# Patient Record
Sex: Female | Born: 1945
Health system: Southern US, Community
[De-identification: ages and names within clinical notes are randomized; demographics above are authoritative.]

## PROBLEM LIST (undated history)

## (undated) DIAGNOSIS — J42 Unspecified chronic bronchitis: Secondary | ICD-10-CM

## (undated) DIAGNOSIS — J189 Pneumonia, unspecified organism: Secondary | ICD-10-CM

## (undated) DIAGNOSIS — R911 Solitary pulmonary nodule: Secondary | ICD-10-CM

## (undated) DIAGNOSIS — I10 Essential (primary) hypertension: Secondary | ICD-10-CM

## (undated) DIAGNOSIS — A419 Sepsis, unspecified organism: Secondary | ICD-10-CM

## (undated) DIAGNOSIS — E785 Hyperlipidemia, unspecified: Secondary | ICD-10-CM

## (undated) HISTORY — DX: Essential (primary) hypertension: I10

## (undated) HISTORY — PX: CHOLECYSTECTOMY: SHX55

## (undated) HISTORY — DX: Unspecified chronic bronchitis: J42

## (undated) HISTORY — DX: Solitary pulmonary nodule: R91.1

## (undated) HISTORY — DX: Hyperlipidemia, unspecified: E78.5

---

## 2016-09-23 DIAGNOSIS — Z23 Encounter for immunization: Secondary | ICD-10-CM | POA: Diagnosis not present

## 2016-10-17 DIAGNOSIS — Z6828 Body mass index (BMI) 28.0-28.9, adult: Secondary | ICD-10-CM | POA: Diagnosis not present

## 2016-10-17 DIAGNOSIS — L290 Pruritus ani: Secondary | ICD-10-CM | POA: Diagnosis not present

## 2016-10-17 DIAGNOSIS — I10 Essential (primary) hypertension: Secondary | ICD-10-CM | POA: Diagnosis not present

## 2016-10-17 DIAGNOSIS — L298 Other pruritus: Secondary | ICD-10-CM | POA: Diagnosis not present

## 2016-10-17 DIAGNOSIS — E785 Hyperlipidemia, unspecified: Secondary | ICD-10-CM | POA: Diagnosis not present

## 2017-01-17 DIAGNOSIS — Z1231 Encounter for screening mammogram for malignant neoplasm of breast: Secondary | ICD-10-CM | POA: Diagnosis not present

## 2017-05-16 DIAGNOSIS — Z23 Encounter for immunization: Secondary | ICD-10-CM | POA: Diagnosis not present

## 2017-09-10 DIAGNOSIS — Z6828 Body mass index (BMI) 28.0-28.9, adult: Secondary | ICD-10-CM | POA: Diagnosis not present

## 2017-09-10 DIAGNOSIS — Z1321 Encounter for screening for nutritional disorder: Secondary | ICD-10-CM | POA: Diagnosis not present

## 2017-09-10 DIAGNOSIS — E785 Hyperlipidemia, unspecified: Secondary | ICD-10-CM | POA: Diagnosis not present

## 2017-09-20 DIAGNOSIS — E785 Hyperlipidemia, unspecified: Secondary | ICD-10-CM | POA: Diagnosis not present

## 2017-09-20 DIAGNOSIS — Z1321 Encounter for screening for nutritional disorder: Secondary | ICD-10-CM | POA: Diagnosis not present

## 2017-11-13 DIAGNOSIS — Z0000 Encounter for general adult medical examination without abnormal findings: Secondary | ICD-10-CM | POA: Diagnosis not present

## 2017-11-13 DIAGNOSIS — Z6828 Body mass index (BMI) 28.0-28.9, adult: Secondary | ICD-10-CM | POA: Diagnosis not present

## 2018-01-21 DIAGNOSIS — Z1231 Encounter for screening mammogram for malignant neoplasm of breast: Secondary | ICD-10-CM | POA: Diagnosis not present

## 2018-06-12 DIAGNOSIS — N390 Urinary tract infection, site not specified: Secondary | ICD-10-CM | POA: Diagnosis not present

## 2018-09-04 DIAGNOSIS — J849 Interstitial pulmonary disease, unspecified: Secondary | ICD-10-CM

## 2018-09-04 HISTORY — DX: Interstitial pulmonary disease, unspecified: J84.9

## 2018-11-04 DIAGNOSIS — H1852 Epithelial (juvenile) corneal dystrophy: Secondary | ICD-10-CM | POA: Diagnosis not present

## 2018-11-04 DIAGNOSIS — H25812 Combined forms of age-related cataract, left eye: Secondary | ICD-10-CM | POA: Diagnosis not present

## 2018-11-04 DIAGNOSIS — H25811 Combined forms of age-related cataract, right eye: Secondary | ICD-10-CM | POA: Diagnosis not present

## 2018-11-04 DIAGNOSIS — Z01818 Encounter for other preprocedural examination: Secondary | ICD-10-CM | POA: Diagnosis not present

## 2018-11-04 DIAGNOSIS — H02052 Trichiasis without entropian right lower eyelid: Secondary | ICD-10-CM | POA: Diagnosis not present

## 2018-11-26 DIAGNOSIS — Z808 Family history of malignant neoplasm of other organs or systems: Secondary | ICD-10-CM | POA: Diagnosis not present

## 2018-11-26 DIAGNOSIS — M8580 Other specified disorders of bone density and structure, unspecified site: Secondary | ICD-10-CM | POA: Diagnosis not present

## 2018-11-26 DIAGNOSIS — Z0000 Encounter for general adult medical examination without abnormal findings: Secondary | ICD-10-CM | POA: Diagnosis not present

## 2018-11-26 DIAGNOSIS — Z1211 Encounter for screening for malignant neoplasm of colon: Secondary | ICD-10-CM | POA: Diagnosis not present

## 2018-11-26 DIAGNOSIS — E2839 Other primary ovarian failure: Secondary | ICD-10-CM | POA: Diagnosis not present

## 2018-11-26 DIAGNOSIS — E7800 Pure hypercholesterolemia, unspecified: Secondary | ICD-10-CM | POA: Diagnosis not present

## 2018-11-26 DIAGNOSIS — I10 Essential (primary) hypertension: Secondary | ICD-10-CM | POA: Diagnosis not present

## 2018-11-26 DIAGNOSIS — R350 Frequency of micturition: Secondary | ICD-10-CM | POA: Diagnosis not present

## 2018-11-26 DIAGNOSIS — Z1159 Encounter for screening for other viral diseases: Secondary | ICD-10-CM | POA: Diagnosis not present

## 2018-11-26 DIAGNOSIS — Z1389 Encounter for screening for other disorder: Secondary | ICD-10-CM | POA: Diagnosis not present

## 2018-11-26 DIAGNOSIS — N952 Postmenopausal atrophic vaginitis: Secondary | ICD-10-CM | POA: Diagnosis not present

## 2018-12-12 ENCOUNTER — Other Ambulatory Visit: Payer: Self-pay | Admitting: Family Medicine

## 2018-12-12 DIAGNOSIS — E2839 Other primary ovarian failure: Secondary | ICD-10-CM

## 2019-01-21 DIAGNOSIS — H25811 Combined forms of age-related cataract, right eye: Secondary | ICD-10-CM | POA: Diagnosis not present

## 2019-01-21 DIAGNOSIS — Z01818 Encounter for other preprocedural examination: Secondary | ICD-10-CM | POA: Diagnosis not present

## 2019-01-22 DIAGNOSIS — Z1231 Encounter for screening mammogram for malignant neoplasm of breast: Secondary | ICD-10-CM | POA: Diagnosis not present

## 2019-02-10 DIAGNOSIS — T7840XA Allergy, unspecified, initial encounter: Secondary | ICD-10-CM | POA: Diagnosis not present

## 2019-02-16 ENCOUNTER — Encounter (HOSPITAL_COMMUNITY): Payer: Self-pay

## 2019-02-16 ENCOUNTER — Emergency Department (HOSPITAL_COMMUNITY): Payer: Medicare Other

## 2019-02-16 ENCOUNTER — Emergency Department (HOSPITAL_COMMUNITY)
Admission: EM | Admit: 2019-02-16 | Discharge: 2019-02-16 | Disposition: A | Discharge status: Home / Self Care | Payer: Medicare Other | Attending: Emergency Medicine | Admitting: Emergency Medicine

## 2019-02-16 ENCOUNTER — Other Ambulatory Visit: Payer: Self-pay

## 2019-02-16 DIAGNOSIS — J209 Acute bronchitis, unspecified: Secondary | ICD-10-CM | POA: Insufficient documentation

## 2019-02-16 DIAGNOSIS — Z79899 Other long term (current) drug therapy: Secondary | ICD-10-CM | POA: Diagnosis not present

## 2019-02-16 DIAGNOSIS — J4540 Moderate persistent asthma, uncomplicated: Secondary | ICD-10-CM

## 2019-02-16 DIAGNOSIS — Z87891 Personal history of nicotine dependence: Secondary | ICD-10-CM | POA: Insufficient documentation

## 2019-02-16 DIAGNOSIS — R05 Cough: Secondary | ICD-10-CM | POA: Diagnosis not present

## 2019-02-16 DIAGNOSIS — R918 Other nonspecific abnormal finding of lung field: Secondary | ICD-10-CM | POA: Diagnosis not present

## 2019-02-16 HISTORY — DX: Pneumonia, unspecified organism: J18.9

## 2019-02-16 HISTORY — DX: Sepsis, unspecified organism: A41.9

## 2019-02-16 LAB — CBC
HCT: 43 % (ref 36.0–46.0)
Hemoglobin: 14.4 g/dL (ref 12.0–15.0)
MCH: 30.8 pg (ref 26.0–34.0)
MCHC: 33.5 g/dL (ref 30.0–36.0)
MCV: 92.1 fL (ref 80.0–100.0)
Platelets: 228 10*3/uL (ref 150–400)
RBC: 4.67 MIL/uL (ref 3.87–5.11)
RDW: 13.2 % (ref 11.5–15.5)
WBC: 11.9 10*3/uL — ABNORMAL HIGH (ref 4.0–10.5)
nRBC: 0 % (ref 0.0–0.2)

## 2019-02-16 LAB — BASIC METABOLIC PANEL
Anion gap: 10 (ref 5–15)
BUN: 15 mg/dL (ref 8–23)
CO2: 29 mmol/L (ref 22–32)
Calcium: 9.1 mg/dL (ref 8.9–10.3)
Chloride: 100 mmol/L (ref 98–111)
Creatinine, Ser: 0.59 mg/dL (ref 0.44–1.00)
GFR calc Af Amer: >60 mL/min (ref >60)
GFR calc non Af Amer: >60 mL/min (ref >60)
Glucose, Bld: 103 mg/dL — ABNORMAL HIGH (ref 70–99)
Potassium: 3.6 mmol/L (ref 3.5–5.1)
Sodium: 139 mmol/L (ref 135–145)

## 2019-02-16 IMAGING — DX PORTABLE CHEST - 1 VIEW
2 series · 2 of 2 positions shown · non-contrast
Comparison: None.

CLINICAL DATA: Cough and weakness.

EXAM:
PORTABLE CHEST 1 VIEW

[chest ap (1 of 2)]
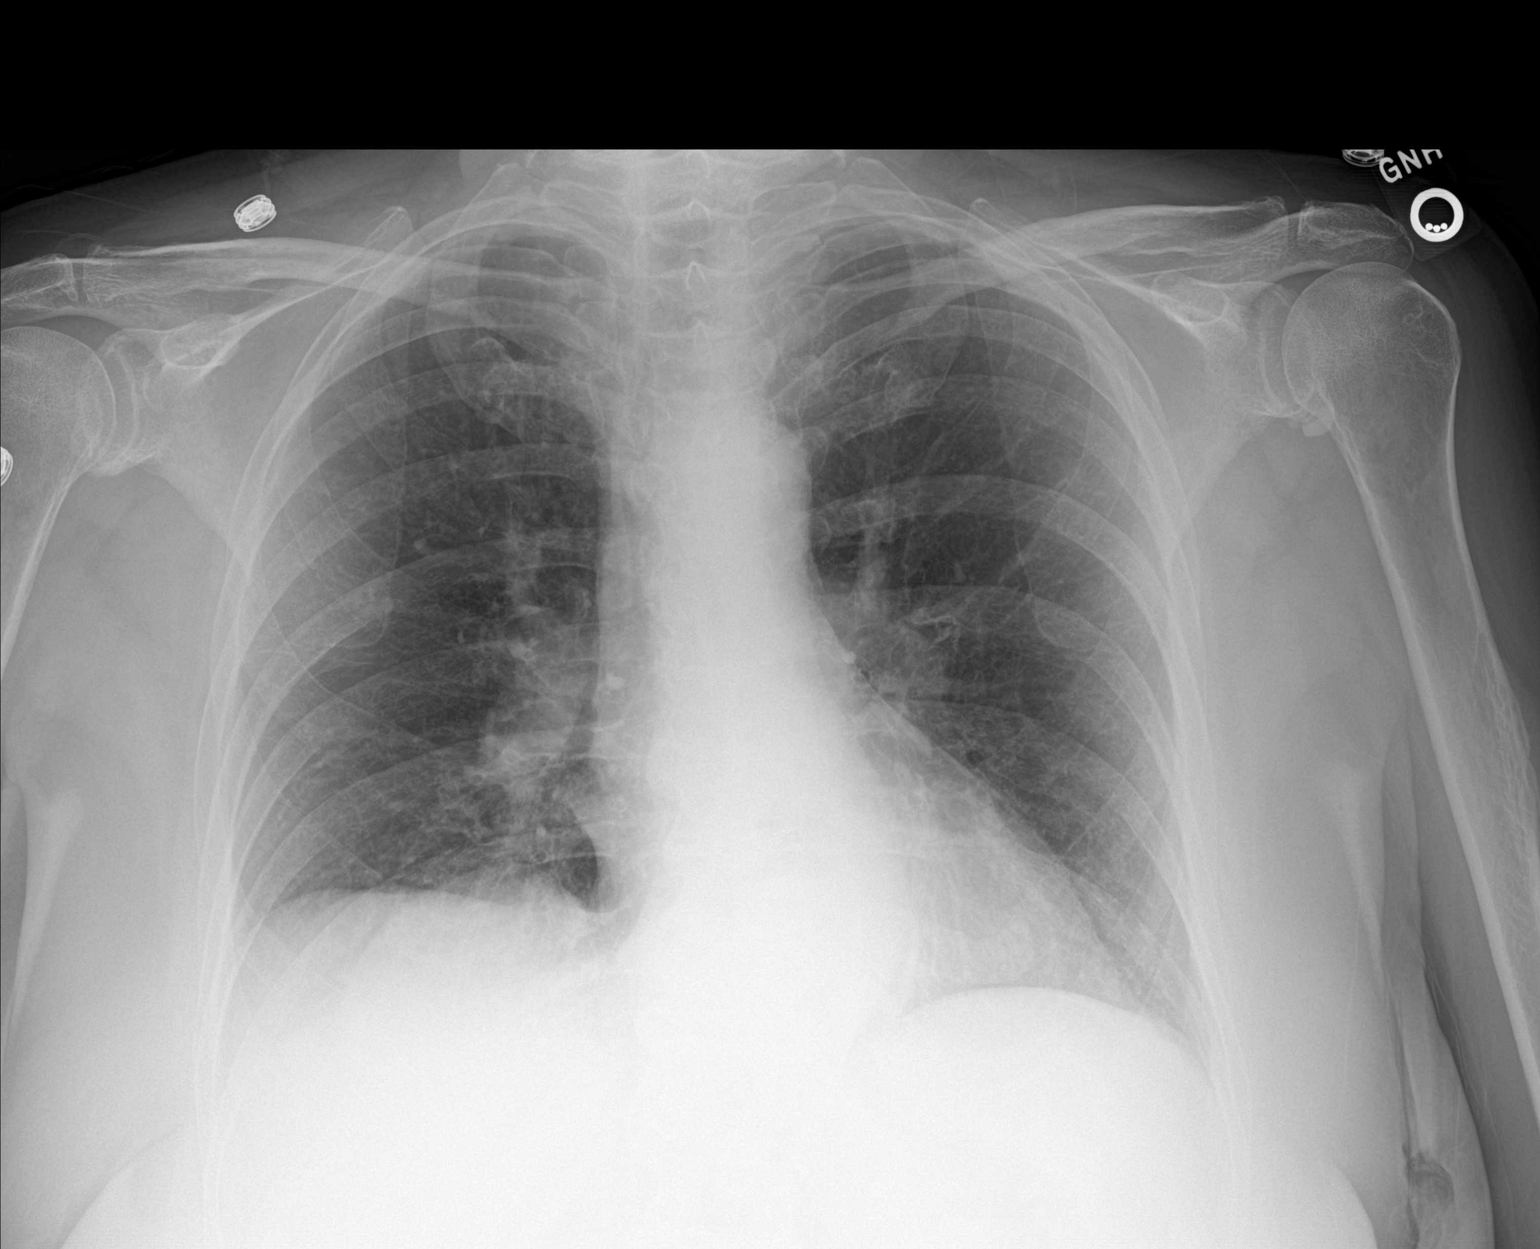

[chest ap (2 of 2)]
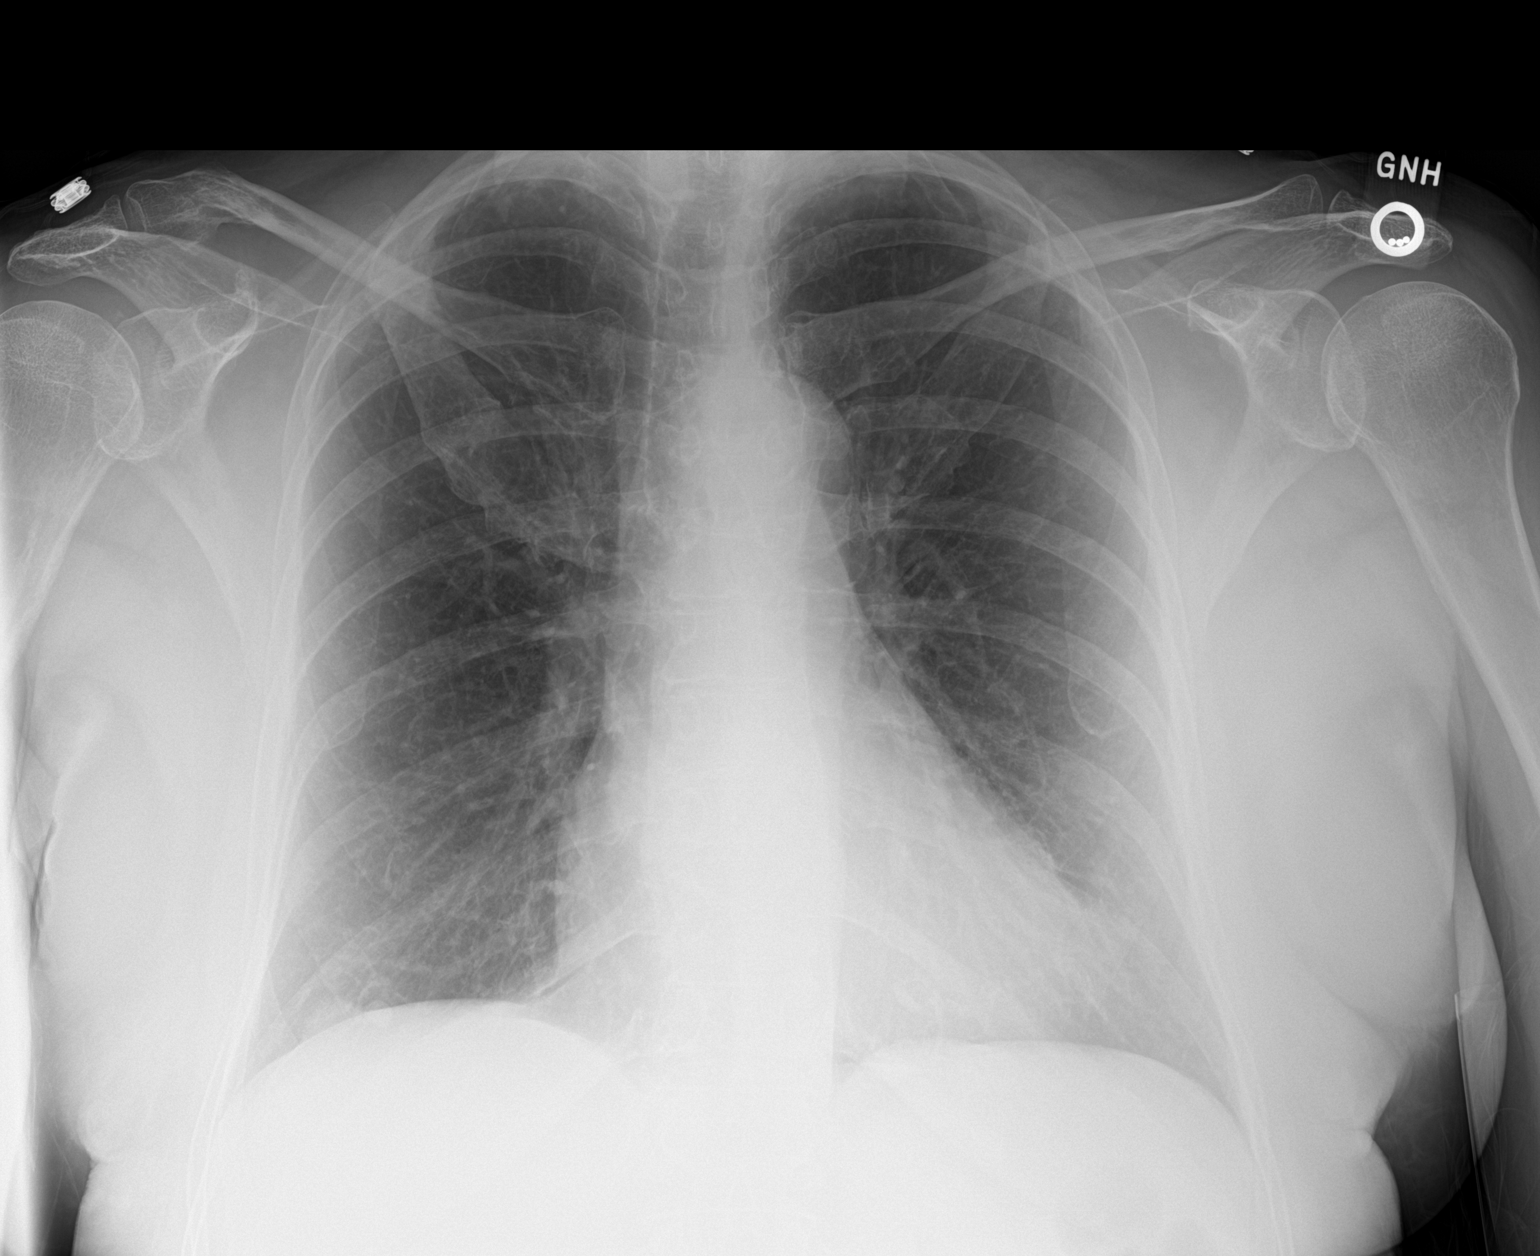

[2 of 2 positions shown; findings below may reference images not displayed]

FINDINGS: The heart, hila, and mediastinum are normal. No pneumothorax. Mild
increased opacity in the medial right base. No other pulmonary
opacities, nodules, or masses.
IMPRESSION: Mild opacity in the medial right lung base is favored to represent
vascular crowding. A subtle infiltrate projected over the infrahilar
region is not excluded on this study. A PA and lateral chest x-ray
may better evaluate. Alternately, recommend short-term follow-up to
ensure resolution.

## 2019-02-16 IMAGING — CT CT CHEST WITHOUT CONTRAST
2 of 3 series · 15 of 36 positions shown, 18 images · non-contrast
Comparison: Radiograph [DATE]

CLINICAL DATA: Cough and short of breath for 1 week

EXAM:
CT CHEST WITHOUT CONTRAST
TECHNIQUE: Multidetector CT imaging of the chest was performed following the
standard protocol without IV contrast.

[Series 2: thorax · axial · 0.68mm/px · z∈[-268,-12]mm · 12 of 152 slices shown, 15 images]
[im 12/152  mediastinal]
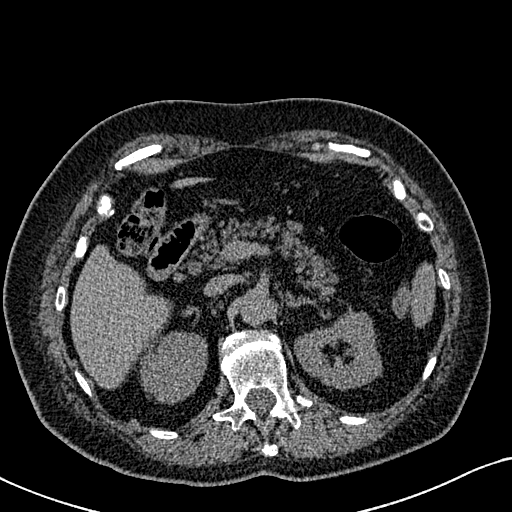
[im 12/152  lung]
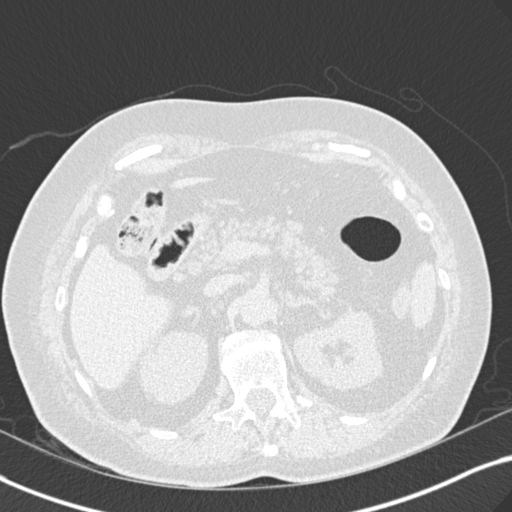
[im 23/152  lung]
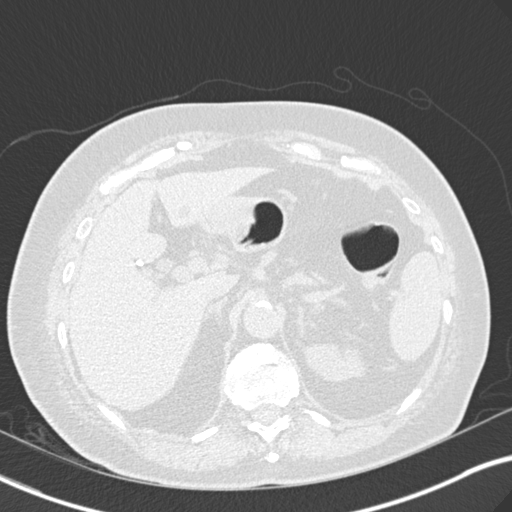
[im 34/152  lung]
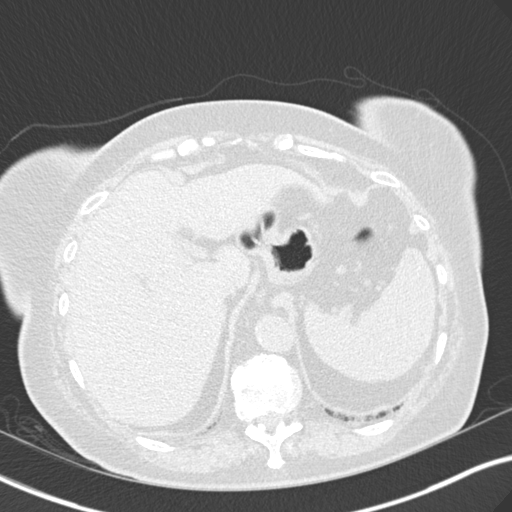
[im 45/152  lung]
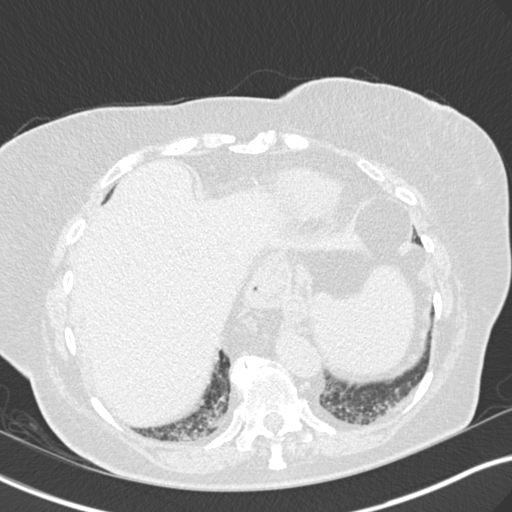
[im 56/152  mediastinal]
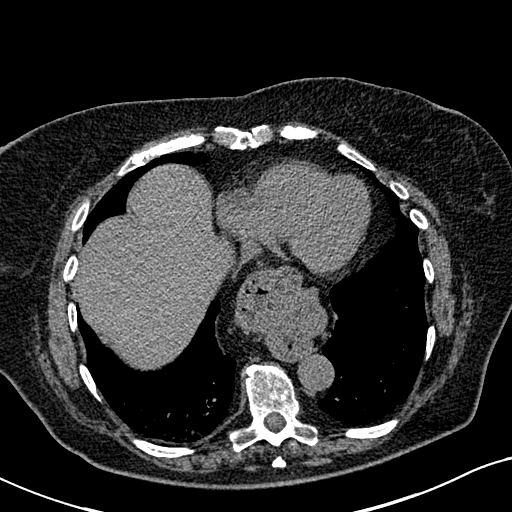
[im 56/152  lung]
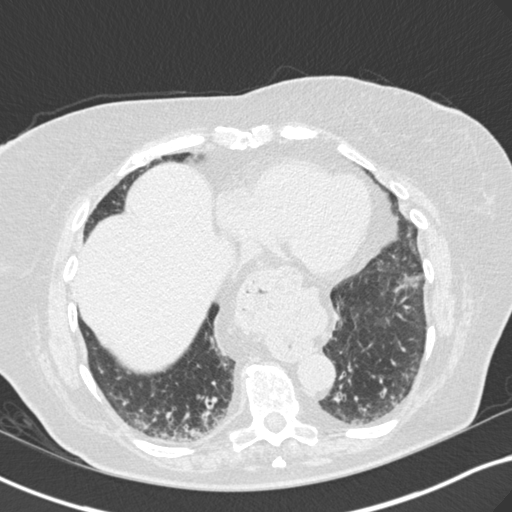
[im 68/152  lung]
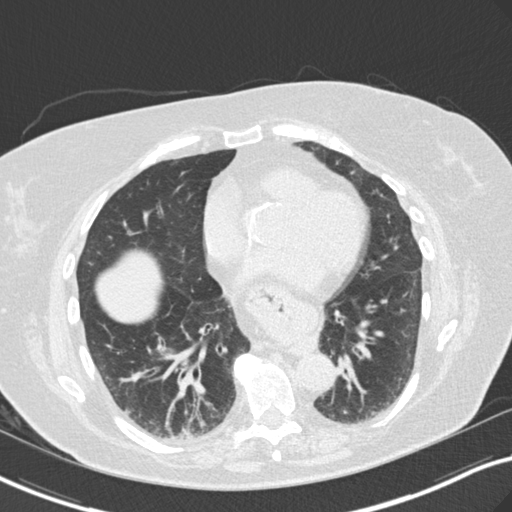
[im 84/152  lung]
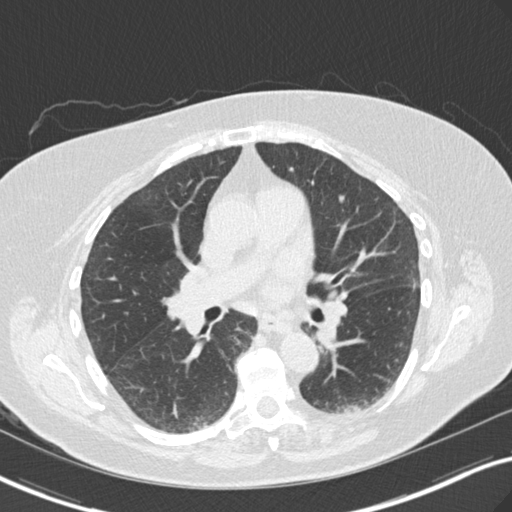
[im 96/152  lung]
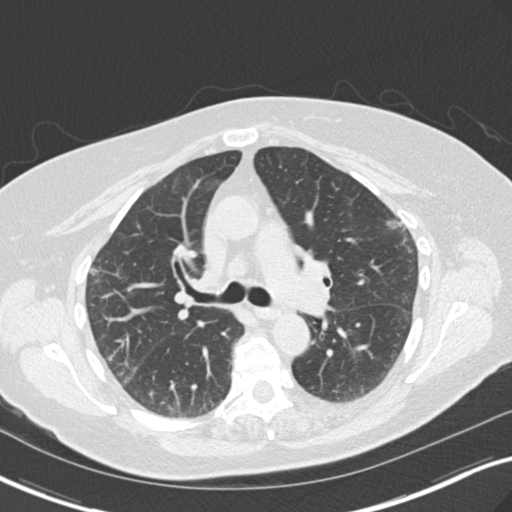
[im 107/152  mediastinal]
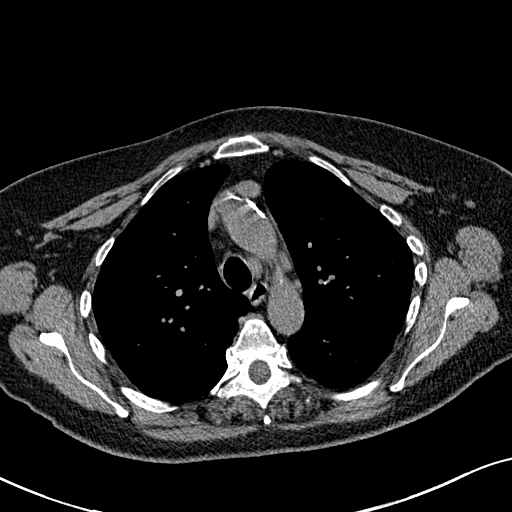
[im 107/152  lung]
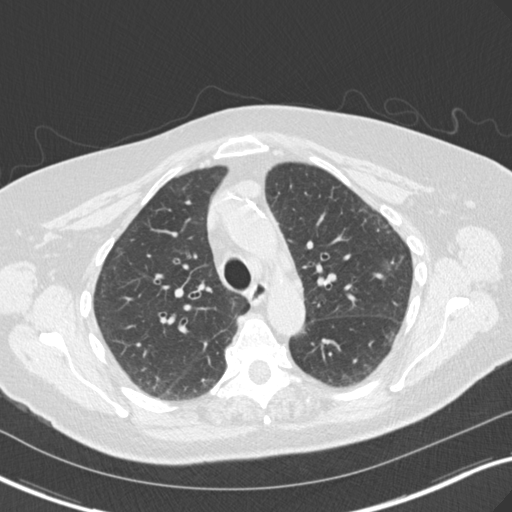
[im 118/152  lung]
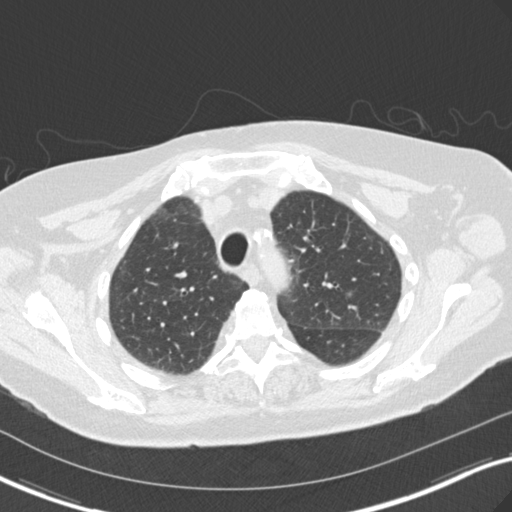
[im 129/152  lung]
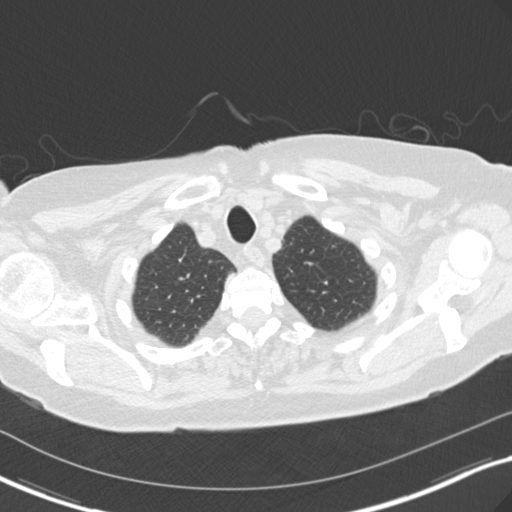
[im 140/152  lung]
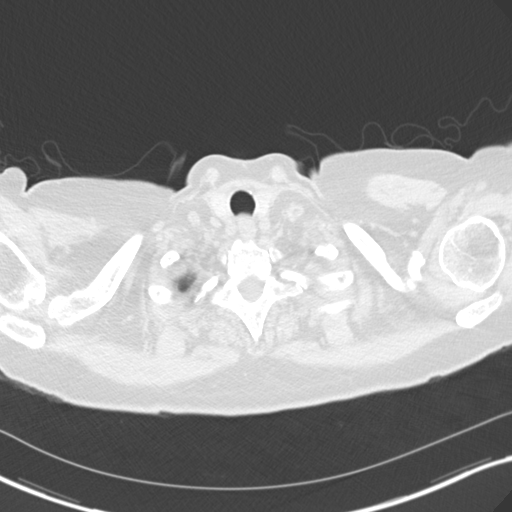

[Series 5: coronal · coronal · 0.64mm/px · 3 of 132 slices shown]
[im 27/132  lung]
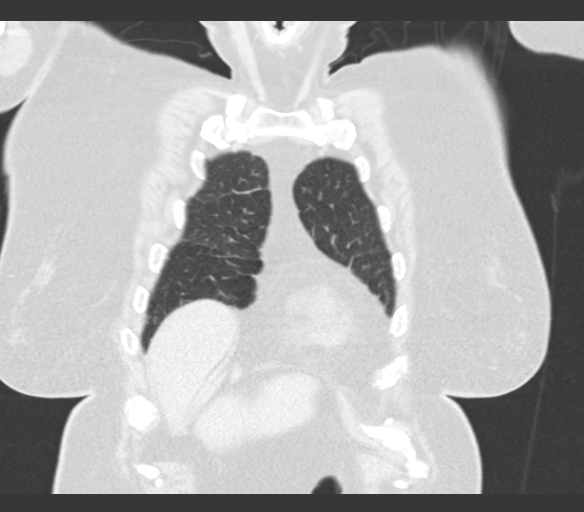
[im 53/132  lung]
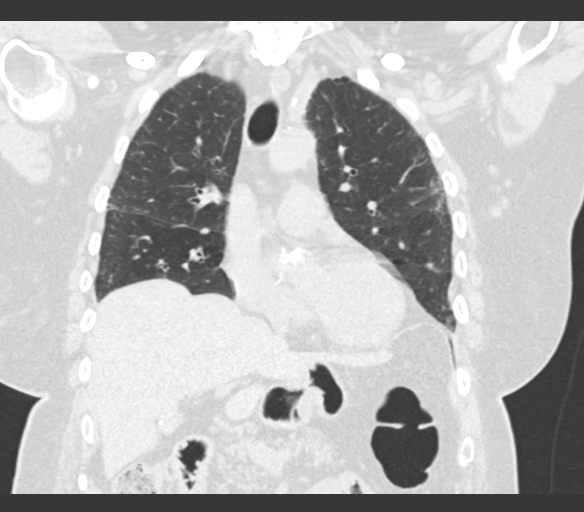
[im 79/132  lung]
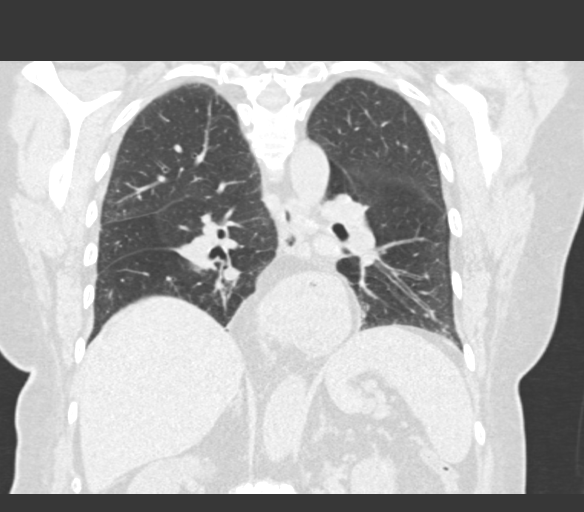

[15 of 36 positions shown; findings below may reference images not displayed]

FINDINGS: Cardiovascular: No significant vascular findings. Normal heart size.
No pericardial effusion.

Mediastinum/Nodes: No axillary supraclavicular adenopathy.
Paratracheal lymph nodes and RIGHT hilar nodes are mildly enlarged
measuring 12-15 mm. Large hiatal hernia posterior the heart.

Lungs/Pleura: Subpleural reticulation in lungs. Bronchial thickening
in the lower lobes. Scattered ground-glass opacities.

Solid nodule with hazy margin in the LEFT upper lobe measures 6 mm
(image 45/7).

Upper Abdomen: Limited view of the liver, kidneys, pancreas are
unremarkable. Normal adrenal glands.

Musculoskeletal: No aggressive osseous lesion.
IMPRESSION: 1. Bronchial thickening in lower lobes and scattered ground-glass
opacities as well as subpleural reticulation. Findings suggest mild
interstitial lung disease potential respiratory bronchiolitis
interstitial lung disease.
2. Mild mediastinal adenopathy likely reactive. Large hiatal hernia.
3. Noncalcified nodule in the LEFT upper lobe warrants follow-up.
Non-contrast chest CT at 6-12 months is recommended. If the nodule
is stable at time of repeat CT, then future CT at 18-24 months (from
today's scan) is considered optional for low-risk patients, but is
recommended for high-risk patients. This recommendation follows the
consensus statement: Guidelines for Management of Incidental
Pulmonary Nodules Detected on CT Images: From the [HOSPITAL]

## 2019-02-16 MED ORDER — ALBUTEROL SULFATE HFA 108 (90 BASE) MCG/ACT IN AERS: 1.0000 | INHALATION_SPRAY | Freq: Four times a day (QID) | RESPIRATORY_TRACT | 1 refills | Status: DC | PRN

## 2019-02-16 MED ORDER — PREDNISONE 10 MG PO TABS: 40.0000 mg | ORAL_TABLET | Freq: Every day | ORAL | 0 refills | Status: DC

## 2019-02-16 NOTE — ED Provider Notes (Signed)
[facility] DEPT Provider Note   CSN: [id number] Arrival date & time: 02/16/19  1026     History   Chief Complaint Chief Complaint  Patient presents with   Cough    HPI Ann [name] is a 73 y.o. female.     Patient with a complaint of a productive cough for 2 to 3 weeks.  She is having a lot of difficulty sleeping because of the cough.  Followed by Ann [name] primary care.  They did a telemedicine visit with her and started her on Tessalon Perles and Zyrtec.  Patient states it has not really helped much.  She denies any fevers.  Denies any COVID-19 exposure.  Patient is also been feeling a little fatigued and a little lethargic.  No syncope no headache no chest pain no abdominal pain.  No leg swelling.     Past Medical History:  Diagnosis Date   Pneumonia    Sepsis ([place])     There are no active problems to display for this patient.   Past Surgical History:  Procedure Laterality Date   CHOLECYSTECTOMY       OB History   No obstetric history on file.      Home Medications    Prior to Admission medications   Medication Sig Start Date End Date Taking? Authorizing Provider  atorvastatin (LIPITOR) 20 MG tablet Take 20 mg by mouth daily. 12/26/18  Yes [provider]  benzonatate (TESSALON) 100 MG capsule Take 100 mg by mouth daily. 02/13/19  Yes [provider]  cetirizine (ZYRTEC) 10 MG tablet Take 10 mg by mouth daily.   Yes [provider]  estradiol (ESTRACE) 0.1 MG/GM vaginal cream Place 0.5 g vaginally 2 (two) times a week. 11/28/18  Yes [provider]  fluticasone (FLONASE) 50 MCG/ACT nasal spray Place 1 spray into both nostrils daily.   Yes [provider]  lisinopril-hydrochlorothiazide (ZESTORETIC) 20-25 MG tablet Take 1 tablet by mouth daily. 12/26/18  Yes [provider]  albuterol (VENTOLIN HFA) 108 (90 Base) MCG/ACT inhaler Inhale 1-2 puffs into the lungs every 6 (six)  hours as needed for wheezing or shortness of breath. 02/16/19   Fredia Sorrow, MD  predniSONE (DELTASONE) 10 MG tablet Take 4 tablets (40 mg total) by mouth daily. 02/16/19   Fredia Sorrow, MD    Family History Family History  Problem Relation Age of Onset   Heart failure Mother    Osteoporosis Mother    COPD Father    Diabetes Father     Social History Social History   Tobacco Use   Smoking status: Former Smoker   Smokeless tobacco: Never Used   Tobacco comment: 40 years ago  Substance Use Topics   Alcohol use: Yes   Drug use: Never     Allergies   Patient has no known allergies.   Review of Systems Review of Systems  Constitutional: Positive for fatigue. Negative for chills and fever.  HENT: Positive for congestion. Negative for rhinorrhea and sore throat.   Eyes: Negative for visual disturbance.  Respiratory: Positive for cough. Negative for shortness of breath.   Cardiovascular: Negative for chest pain and leg swelling.  Gastrointestinal: Negative for abdominal pain, diarrhea, nausea and vomiting.  Genitourinary: Negative for dysuria.  Musculoskeletal: Negative for back pain and neck pain.  Skin: Negative for rash.  Neurological: Negative for dizziness, light-headedness and headaches.  Hematological: Does not bruise/bleed easily.  Psychiatric/Behavioral: Negative for confusion.     Physical Exam Updated  Vital Signs BP 131/62    Pulse 93    Temp 98.5 F (36.9 C) (Oral)    Resp 18    Ht 1.626 m (5\' 4" )    Wt 74.8 kg    SpO2 95%    BMI 28.32 kg/m   Physical Exam Vitals signs and nursing note reviewed.  Constitutional:      General: She is not in acute distress.    Appearance: She is well-developed.  HENT:     Head: Normocephalic and atraumatic.  Eyes:     Extraocular Movements: Extraocular movements intact.     Conjunctiva/sclera: Conjunctivae normal.     Pupils: Pupils are equal, round, and reactive to light.  Neck:     Musculoskeletal:  Normal range of motion and neck supple.  Cardiovascular:     Rate and Rhythm: Normal rate and regular rhythm.     Heart sounds: No murmur.  Pulmonary:     Effort: Pulmonary effort is normal. No respiratory distress.     Breath sounds: Wheezing present.     Comments: Very mild wheezing bilaterally. Chest:     Chest wall: No tenderness.  Abdominal:     Palpations: Abdomen is soft.     Tenderness: There is no abdominal tenderness.  Musculoskeletal: Normal range of motion.        General: No swelling.  Skin:    General: Skin is warm and dry.     Capillary Refill: Capillary refill takes less than 2 seconds.  Neurological:     General: No focal deficit present.     Mental Status: She is alert and oriented to person, place, and time.      ED Treatments / Results  Labs (all labs ordered are listed, but only abnormal results are displayed) Labs Reviewed  CBC - Abnormal; Notable for the following components:      Result Value   WBC 11.9 (*)    All other components within normal limits  BASIC METABOLIC PANEL - Abnormal; Notable for the following components:   Glucose, Bld 103 (*)    All other components within normal limits    EKG    Radiology Ct Chest Wo Contrast  Result Date: 02/16/2019 CLINICAL DATA:  Cough and short of breath for 1 week EXAM: CT CHEST WITHOUT CONTRAST TECHNIQUE: Multidetector CT imaging of the chest was performed following the standard protocol without IV contrast. COMPARISON:  Radiograph 02/16/2019 FINDINGS: Cardiovascular: No significant vascular findings. Normal heart size. No pericardial effusion. Mediastinum/Nodes: No axillary supraclavicular adenopathy. Paratracheal lymph nodes and RIGHT hilar nodes are mildly enlarged measuring 12-15 mm. Large hiatal hernia posterior the heart. Lungs/Pleura: Subpleural reticulation in lungs. Bronchial thickening in the lower lobes. Scattered ground-glass opacities. Solid nodule with hazy margin in the LEFT upper lobe  measures 6 mm (image 45/7). Upper Abdomen: Limited view of the liver, kidneys, pancreas are unremarkable. Normal adrenal glands. Musculoskeletal: No aggressive osseous lesion. IMPRESSION: 1. Bronchial thickening in lower lobes and scattered ground-glass opacities as well as subpleural reticulation. Findings suggest mild interstitial lung disease potential respiratory bronchiolitis interstitial lung disease. 2. Mild mediastinal adenopathy likely reactive. Large hiatal hernia. 3. Noncalcified nodule in the LEFT upper lobe warrants follow-up. Non-contrast chest CT at 6-12 months is recommended. If the nodule is stable at time of repeat CT, then future CT at 18-24 months (from today's scan) is considered optional for low-risk patients, but is recommended for high-risk patients. This recommendation follows the consensus statement: Guidelines for Management of Incidental Pulmonary Nodules  Detected on CT Images: From the Fleischner Society 2017; Radiology 2017; [phone]. Electronically Signed   By: Suzy Bouchard M.D.   On: 02/16/2019 14:55   Dg Chest Port 1 View  Result Date: 02/16/2019 CLINICAL DATA:  Cough and weakness. EXAM: PORTABLE CHEST 1 VIEW COMPARISON:  None. FINDINGS: The heart, hila, and mediastinum are normal. No pneumothorax. Mild increased opacity in the medial right base. No other pulmonary opacities, nodules, or masses. IMPRESSION: Mild opacity in the medial right lung base is favored to represent vascular crowding. A subtle infiltrate projected over the infrahilar region is not excluded on this study. A PA and lateral chest x-ray may better evaluate. Alternately, recommend short-term follow-up to ensure resolution. Electronically Signed   By: Dorise Bullion III M.D   On: 02/16/2019 12:31    Procedures Procedures (including critical care time)  Medications Ordered in ED Medications - No data to display   Initial Impression / Assessment and Plan / ED Course  I have reviewed the triage  vital signs and the nursing notes.  Pertinent labs & imaging results that were available during my care of the patient were reviewed by me and considered in my medical decision making (see chart for details).        Patient with some faint wheezing here.  May be having reactive airway disease.  Also has a productive cough suggestive of bronchitis.  Patient has been being treated with long-acting antihistamines without specific improvement.  We will go ahead and start her on albuterol inhaler and a 5-day course of prednisone.  Patient know she could continue to take her Zyrtec and she could continue take her Lewayne Bunting.  But recommended that she does try these 2 regimens to see how they work.  CT scan of the chest did find a pulmonary nodule no evidence of pneumonia.  Patient will need a follow-up CT scan of this in 6 to 12 months which she was informed of and understands.  Patient will make an appointment to follow back up with her regular doctor.  Did offer COVID-19 testing although clinically not suspicious patient did not want to be tested.  As stated above patient had CT chest done due to the regular chest x-ray raising concerns for right middle lobe pneumonia.  However CT chest did not show any evidence of pneumonia.  Final Clinical Impressions(s) / ED Diagnoses   Final diagnoses:  Acute bronchitis, unspecified organism  Moderate persistent reactive airway disease without complication    ED Discharge Orders         Ordered    albuterol (VENTOLIN HFA) 108 (90 Base) MCG/ACT inhaler  Every 6 hours PRN     02/16/19 1517    predniSONE (DELTASONE) 10 MG tablet  Daily     02/16/19 1517           Fredia Sorrow, MD 02/16/19 [phone]

## 2019-02-16 NOTE — ED Triage Notes (Signed)
Patient c/o a non productive cough x 2-3 weeks. Patient states she can not sleep for continuous hacking.. patient states she did have a sore throat, but it is gone at this time. Patient also c/o feeling lethargic x 3-4 days.

## 2019-02-16 NOTE — Discharge Instructions (Signed)
Take the prednisone as directed.  Use the albuterol inhaler 2 puffs every 6 hours for the least the next 7 days.  Make an appointment to follow back up with your doctor.  As we discussed there was a pulmonary nodule on the CT scan of your chest they recommend follow-up for that with a repeat CT scan in 6 to 12 months.  Return for any new or worse symptoms.

## 2019-02-19 DIAGNOSIS — R9389 Abnormal findings on diagnostic imaging of other specified body structures: Secondary | ICD-10-CM | POA: Diagnosis not present

## 2019-02-19 DIAGNOSIS — R911 Solitary pulmonary nodule: Secondary | ICD-10-CM | POA: Diagnosis not present

## 2019-02-19 DIAGNOSIS — J209 Acute bronchitis, unspecified: Secondary | ICD-10-CM | POA: Diagnosis not present

## 2019-03-08 ENCOUNTER — Other Ambulatory Visit: Payer: Self-pay

## 2019-03-08 ENCOUNTER — Emergency Department (HOSPITAL_COMMUNITY)
Admission: EM | Admit: 2019-03-08 | Discharge: 2019-03-08 | Disposition: A | Discharge status: Home / Self Care | Payer: Medicare Other | Attending: Emergency Medicine | Admitting: Emergency Medicine

## 2019-03-08 DIAGNOSIS — J40 Bronchitis, not specified as acute or chronic: Secondary | ICD-10-CM | POA: Insufficient documentation

## 2019-03-08 DIAGNOSIS — J209 Acute bronchitis, unspecified: Secondary | ICD-10-CM | POA: Diagnosis not present

## 2019-03-08 DIAGNOSIS — Z87891 Personal history of nicotine dependence: Secondary | ICD-10-CM | POA: Insufficient documentation

## 2019-03-08 DIAGNOSIS — R05 Cough: Secondary | ICD-10-CM | POA: Diagnosis present

## 2019-03-08 MED ORDER — AZITHROMYCIN 250 MG PO TABS: ORAL_TABLET | ORAL | 0 refills | Status: DC

## 2019-03-08 NOTE — Discharge Instructions (Addendum)
Zithromax as prescribed.  Continue over-the-counter cough medications as needed.  Follow-up with your primary doctor if not improving in the next week, and return to the ER if symptoms significantly worsen or change in the meantime.

## 2019-03-08 NOTE — ED Provider Notes (Signed)
[facility] DEPT Provider Note   CSN: [id number] Arrival date & time: 03/08/19  0300     History   Chief Complaint Chief Complaint  Patient presents with  . Cough    HPI Ann [name] is a 73 y.o. female.     Patient is a 73 year old female presenting with complaints of cough and congestion that has been ongoing since the end of May.  She has been seen both in the ER and by her primary doctor.  She has been given inhalers, steroids, cough medication, however none of this has helped.  She denies any fevers or chills.  She denies any COVID exposure.  The history is provided by the patient.  Cough Cough characteristics:  Non-productive Severity:  Moderate Onset quality:  Gradual Duration:  5 weeks Timing:  Constant Progression:  Worsening Chronicity:  New Smoker: no   Relieved by:  Nothing Worsened by:  Nothing Ineffective treatments:  Beta-agonist inhaler, decongestant and cough suppressants   Past Medical History:  Diagnosis Date  . Pneumonia   . Sepsis ([place])     There are no active problems to display for this patient.   Past Surgical History:  Procedure Laterality Date  . CHOLECYSTECTOMY       OB History   No obstetric history on file.      Home Medications    Prior to Admission medications   Medication Sig Start Date End Date Taking? Authorizing Provider  albuterol (VENTOLIN HFA) 108 (90 Base) MCG/ACT inhaler Inhale 1-2 puffs into the lungs every 6 (six) hours as needed for wheezing or shortness of breath. 02/16/19  Yes Fredia Sorrow, MD  atorvastatin (LIPITOR) 20 MG tablet Take 20 mg by mouth daily. 12/26/18  Yes [provider]  benzonatate (TESSALON) 100 MG capsule Take 100 mg by mouth daily. 02/13/19  Yes [provider]  estradiol (ESTRACE) 0.1 MG/GM vaginal cream Place 0.5 g vaginally 2 (two) times a week. 11/28/18  Yes [provider]  HYDROcodone-homatropine (HYCODAN) 5-1.5 MG/5ML syrup  Take 5 mLs by mouth every 6 (six) hours as needed for cough.   Yes [provider]  ibuprofen (ADVIL) 200 MG tablet Take 400 mg by mouth every 6 (six) hours as needed for moderate pain.   Yes [provider]  lisinopril-hydrochlorothiazide (ZESTORETIC) 20-25 MG tablet Take 1 tablet by mouth daily. 12/26/18  Yes [provider]  mometasone-formoterol (DULERA) 100-5 MCG/ACT AERO Inhale 2 puffs into the lungs 2 (two) times daily.   Yes [provider]  predniSONE (DELTASONE) 10 MG tablet Take 4 tablets (40 mg total) by mouth daily. Patient not taking: Reported on 03/08/2019 02/16/19   Fredia Sorrow, MD    Family History Family History  Problem Relation Age of Onset  . Heart failure Mother   . Osteoporosis Mother   . COPD Father   . Diabetes Father     Social History Social History   Tobacco Use  . Smoking status: Former Research scientist (life sciences)  . Smokeless tobacco: Never Used  . Tobacco comment: 40 years ago  Substance Use Topics  . Alcohol use: Yes  . Drug use: Never     Allergies   Patient has no known allergies.   Review of Systems Review of Systems  Respiratory: Positive for cough.   All other systems reviewed and are negative.    Physical Exam Updated Vital Signs BP (!) 170/80   Pulse (!) 108   Temp (!) 97.5 F (36.4 C) (Oral)  Resp 18   SpO2 98%   Physical Exam Vitals signs and nursing note reviewed.  Constitutional:      General: She is not in acute distress.    Appearance: She is well-developed. She is not diaphoretic.  HENT:     Head: Normocephalic and atraumatic.  Neck:     Musculoskeletal: Normal range of motion and neck supple.  Cardiovascular:     Rate and Rhythm: Normal rate and regular rhythm.     Heart sounds: No murmur. No friction rub. No gallop.   Pulmonary:     Effort: Pulmonary effort is normal. No respiratory distress.     Breath sounds: Normal breath sounds. No wheezing.  Abdominal:     General: Bowel sounds are  normal. There is no distension.     Palpations: Abdomen is soft.     Tenderness: There is no abdominal tenderness.  Musculoskeletal: Normal range of motion.  Skin:    General: Skin is warm and dry.  Neurological:     Mental Status: She is alert and oriented to person, place, and time.      ED Treatments / Results  Labs (all labs ordered are listed, but only abnormal results are displayed) Labs Reviewed - No data to display  EKG None  Radiology No results found.  Procedures Procedures (including critical care time)  Medications Ordered in ED Medications - No data to display   Initial Impression / Assessment and Plan / ED Course  I have reviewed the triage vital signs and the nursing notes.  Pertinent labs & imaging results that were available during my care of the patient were reviewed by me and considered in my medical decision making (see chart for details).  Patient presenting with persistent cough since the end of May.  Patient will be treated for bronchitis with Zithromax.  Patient has had work-up including chest x-ray, CT scan, and other testing, however no definite cause has been found.  I see no indication for further work-up this evening.  Her vitals are stable and there is no hypoxia.  Her symptoms are not consistent with COVID-19.  Ann [name] was evaluated in Emergency Department on 03/08/2019 for the symptoms described in the history of present illness. She was evaluated in the context of the global COVID-19 pandemic, which necessitated consideration that the patient might be at risk for infection with the SARS-CoV-2 virus that causes COVID-19. Institutional protocols and algorithms that pertain to the evaluation of patients at risk for COVID-19 are in a state of rapid change based on information released by regulatory bodies including the CDC and federal and state organizations. These policies and algorithms were followed during the patient's care in the ED.    Final Clinical Impressions(s) / ED Diagnoses   Final diagnoses:  None    ED Discharge Orders    None       Veryl Speak, MD 03/08/19 [phone]

## 2019-03-08 NOTE — ED Triage Notes (Signed)
Pt reports a dry cough since May. She has been seen here for same. States that she thought it was improving, but tonight got really bad. No fever. She also endorses and intermittent stabbing pain in her eye.

## 2019-03-21 DIAGNOSIS — L821 Other seborrheic keratosis: Secondary | ICD-10-CM | POA: Diagnosis not present

## 2019-03-21 DIAGNOSIS — D1801 Hemangioma of skin and subcutaneous tissue: Secondary | ICD-10-CM | POA: Diagnosis not present

## 2019-03-21 DIAGNOSIS — L570 Actinic keratosis: Secondary | ICD-10-CM | POA: Diagnosis not present

## 2019-03-26 ENCOUNTER — Other Ambulatory Visit: Payer: Self-pay

## 2019-03-26 ENCOUNTER — Emergency Department (HOSPITAL_COMMUNITY): Payer: Medicare Other

## 2019-03-26 ENCOUNTER — Encounter (HOSPITAL_COMMUNITY): Payer: Self-pay

## 2019-03-26 ENCOUNTER — Emergency Department (HOSPITAL_COMMUNITY)
Admission: EM | Admit: 2019-03-26 | Discharge: 2019-03-26 | Disposition: A | Discharge status: Home / Self Care | Payer: Medicare Other | Attending: Emergency Medicine | Admitting: Emergency Medicine

## 2019-03-26 DIAGNOSIS — Z20828 Contact with and (suspected) exposure to other viral communicable diseases: Secondary | ICD-10-CM | POA: Diagnosis not present

## 2019-03-26 DIAGNOSIS — Z79899 Other long term (current) drug therapy: Secondary | ICD-10-CM | POA: Diagnosis not present

## 2019-03-26 DIAGNOSIS — J40 Bronchitis, not specified as acute or chronic: Secondary | ICD-10-CM | POA: Diagnosis not present

## 2019-03-26 DIAGNOSIS — R7989 Other specified abnormal findings of blood chemistry: Secondary | ICD-10-CM | POA: Diagnosis not present

## 2019-03-26 DIAGNOSIS — R05 Cough: Secondary | ICD-10-CM | POA: Diagnosis not present

## 2019-03-26 DIAGNOSIS — Z87891 Personal history of nicotine dependence: Secondary | ICD-10-CM | POA: Diagnosis not present

## 2019-03-26 LAB — CBC WITH DIFFERENTIAL/PLATELET
Abs Immature Granulocytes: 0.03 10*3/uL (ref 0.00–0.07)
Basophils Absolute: 0.1 10*3/uL (ref 0.0–0.1)
Basophils Relative: 1 %
Eosinophils Absolute: 1.5 10*3/uL — ABNORMAL HIGH (ref 0.0–0.5)
Eosinophils Relative: 15 %
HCT: 41.5 % (ref 36.0–46.0)
Hemoglobin: 14.1 g/dL (ref 12.0–15.0)
Immature Granulocytes: 0 %
Lymphocytes Relative: 13 %
Lymphs Abs: 1.3 10*3/uL (ref 0.7–4.0)
MCH: 31 pg (ref 26.0–34.0)
MCHC: 34 g/dL (ref 30.0–36.0)
MCV: 91.2 fL (ref 80.0–100.0)
Monocytes Absolute: 0.6 10*3/uL (ref 0.1–1.0)
Monocytes Relative: 6 %
Neutro Abs: 6.6 10*3/uL (ref 1.7–7.7)
Neutrophils Relative %: 65 %
Platelets: 244 10*3/uL (ref 150–400)
RBC: 4.55 MIL/uL (ref 3.87–5.11)
RDW: 12.8 % (ref 11.5–15.5)
WBC: 10.1 10*3/uL (ref 4.0–10.5)
nRBC: 0 % (ref 0.0–0.2)

## 2019-03-26 LAB — BASIC METABOLIC PANEL
Anion gap: 13 (ref 5–15)
BUN: 15 mg/dL (ref 8–23)
CO2: 26 mmol/L (ref 22–32)
Calcium: 9.1 mg/dL (ref 8.9–10.3)
Chloride: 97 mmol/L — ABNORMAL LOW (ref 98–111)
Creatinine, Ser: 0.6 mg/dL (ref 0.44–1.00)
GFR calc Af Amer: >60 mL/min (ref >60)
GFR calc non Af Amer: >60 mL/min (ref >60)
Glucose, Bld: 121 mg/dL — ABNORMAL HIGH (ref 70–99)
Potassium: 3.5 mmol/L (ref 3.5–5.1)
Sodium: 136 mmol/L (ref 135–145)

## 2019-03-26 LAB — TROPONIN I (HIGH SENSITIVITY)
Troponin I (High Sensitivity): <2 ng/L (ref <18)
Troponin I (High Sensitivity): <2 ng/L (ref <18)

## 2019-03-26 LAB — SARS CORONAVIRUS 2 BY RT PCR (HOSPITAL ORDER, PERFORMED IN ~~LOC~~ HOSPITAL LAB): SARS Coronavirus 2: NEGATIVE

## 2019-03-26 LAB — D-DIMER, QUANTITATIVE: D-Dimer, Quant: 2.01 ug/mL-FEU — ABNORMAL HIGH (ref 0.00–0.50)

## 2019-03-26 IMAGING — DX PORTABLE CHEST - 1 VIEW
1 series · 1 of 1 positions shown · non-contrast
Comparison: [DATE] chest CT

CLINICAL DATA: Cough and fatigue

EXAM:
PORTABLE CHEST 1 VIEW

[chest ap]
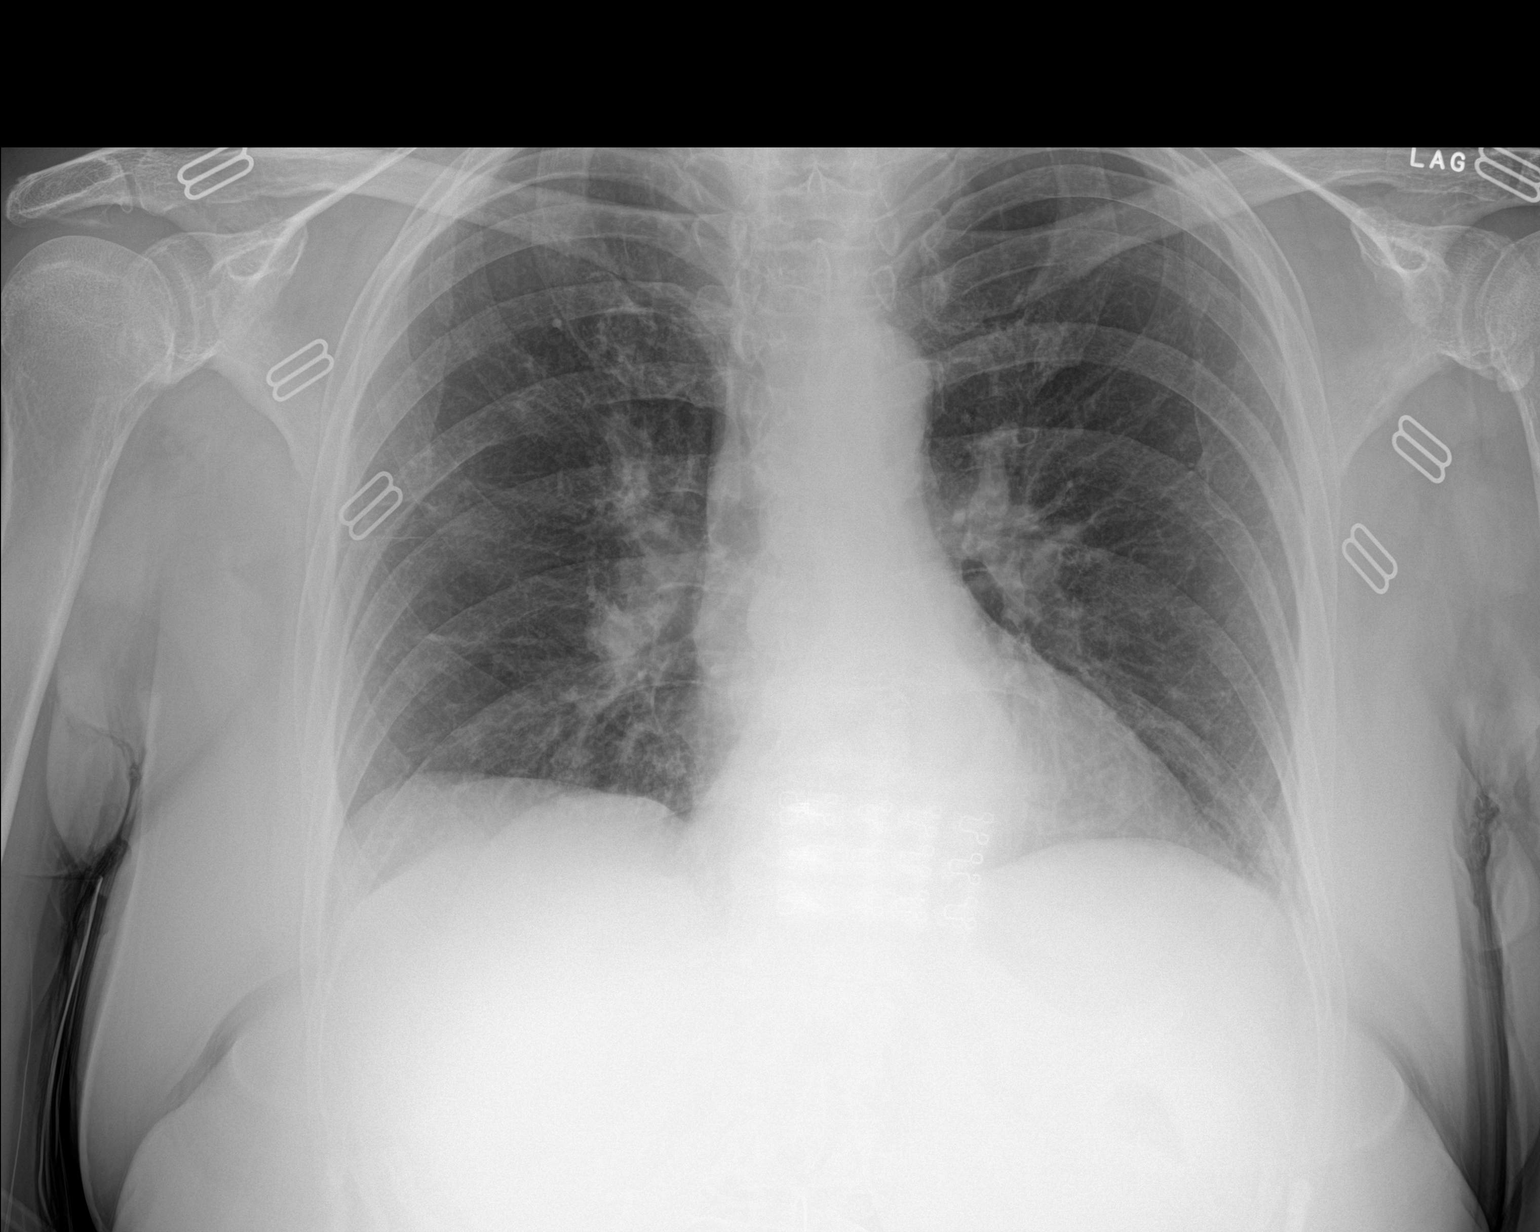

[1 of 1 positions shown; findings below may reference images not displayed]

FINDINGS: Normal heart size and aortic contours. Moderate hiatal hernia. Low
volumes with interstitial crowding and coarsening. There is no
edema, consolidation, effusion, or pneumothorax.
IMPRESSION: No acute or focal finding. Please reference chest CT from last
month.

## 2019-03-26 IMAGING — CT CT ANGIOGRAPHY CHEST
2 of 6 series · 18 of 46 positions shown · IV contrast (omnipaque)
Comparison: Chest x-ray earlier today.  Chest CT [DATE]

CLINICAL DATA: Cough.  Elevated D-dimer.

EXAM:
CT ANGIOGRAPHY CHEST WITH CONTRAST
TECHNIQUE: Multidetector CT imaging of the chest was performed using the
standard protocol during bolus administration of intravenous
contrast. Multiplanar CT image reconstructions and MIPs were
obtained to evaluate the vascular anatomy.
CONTRAST:  100mL OMNIPAQUE IOHEXOL 350 MG/ML SOLN

[Series 5: thins · axial · 0.62mm/px · z∈[+1506,+1710]mm · 15 of 223 slices shown]
[im 10/223  lung]
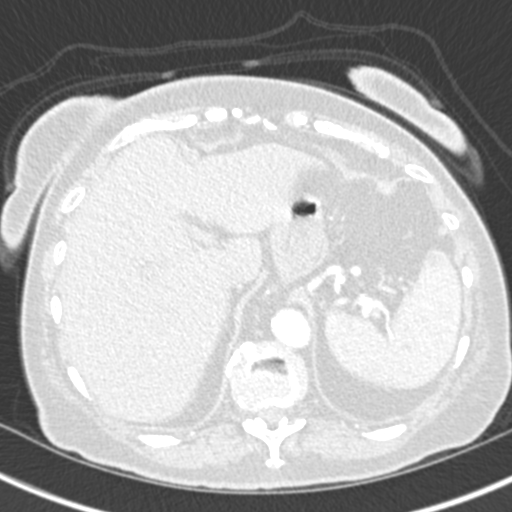
[im 29/223  soft-tissue]
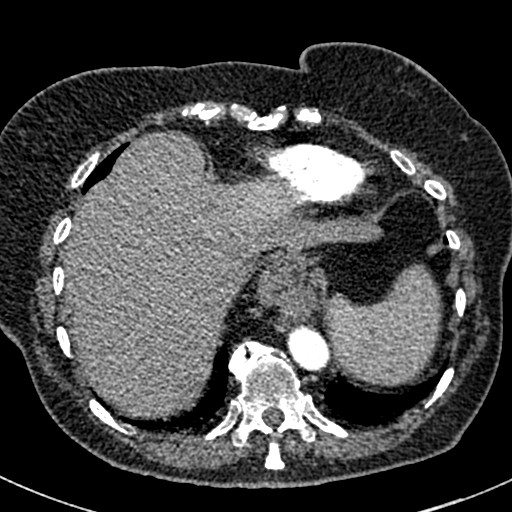
[im 39/223  lung]
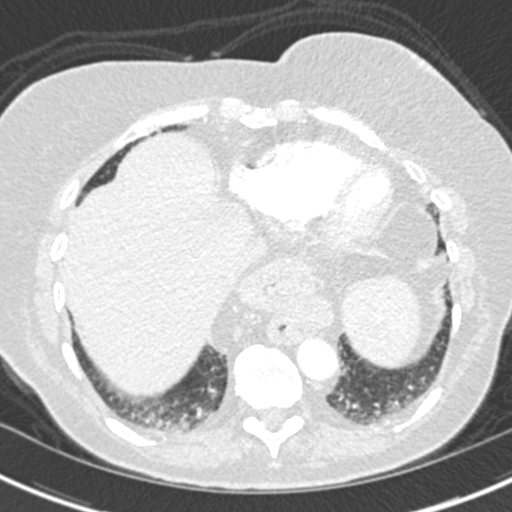
[im 58/223  soft-tissue]
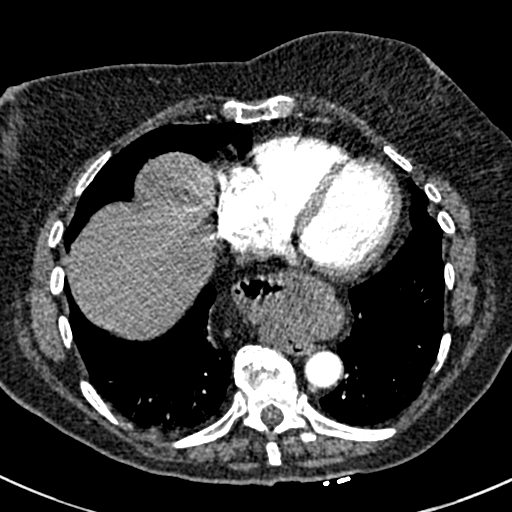
[im 68/223  lung]
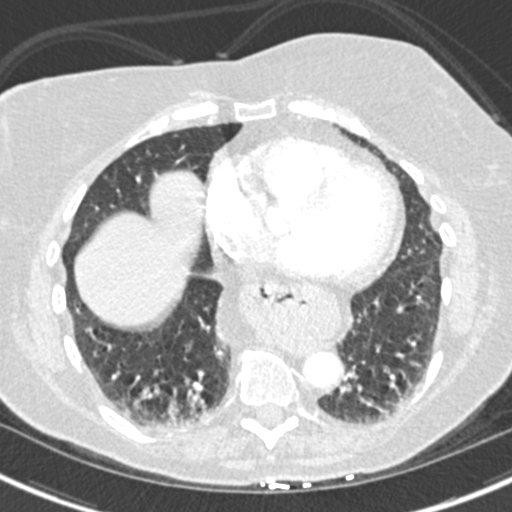
[im 87/223  soft-tissue]
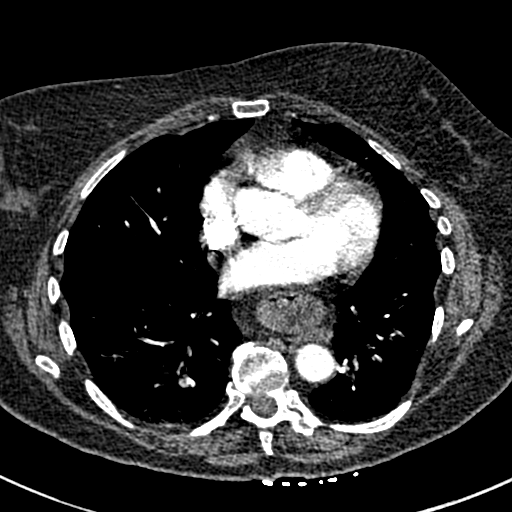
[im 97/223  lung]
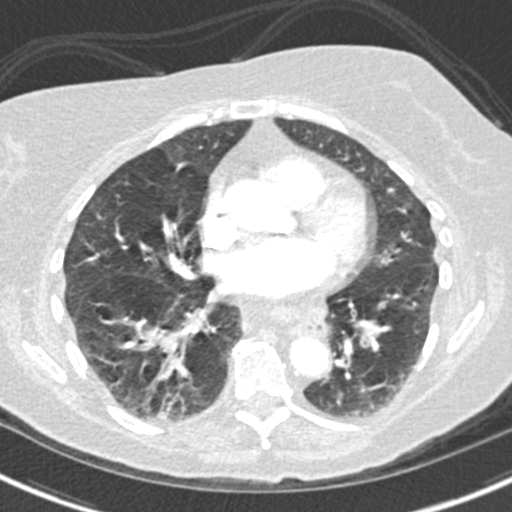
[im 116/223  soft-tissue]
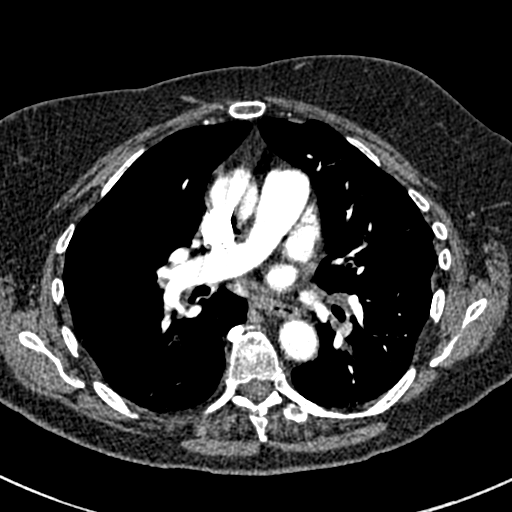
[im 126/223  lung]
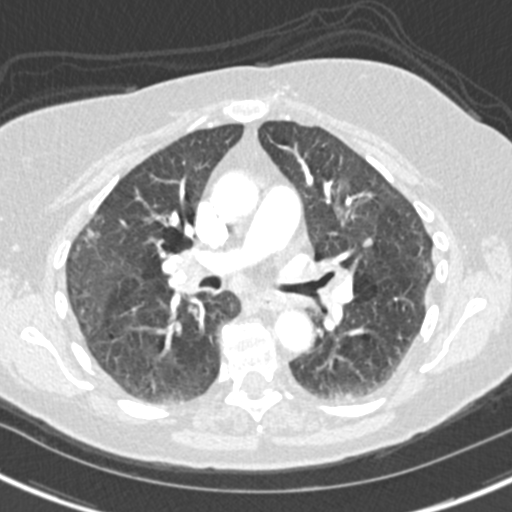
[im 136/223  soft-tissue]
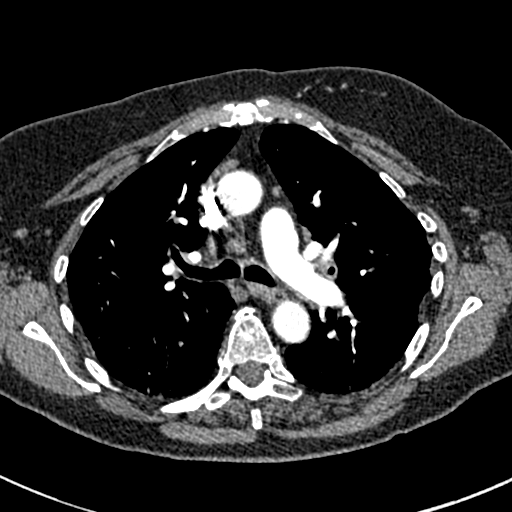
[im 155/223  lung]
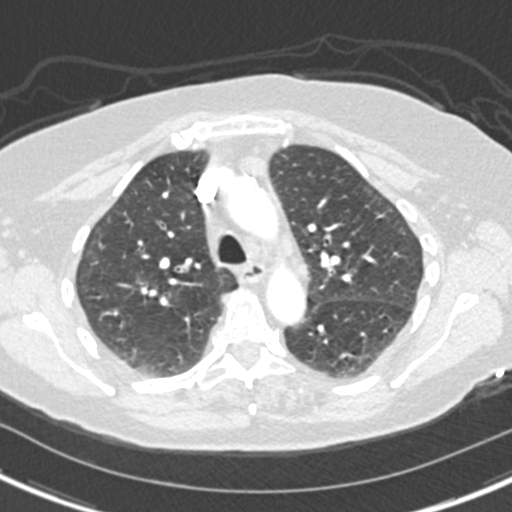
[im 165/223  soft-tissue]
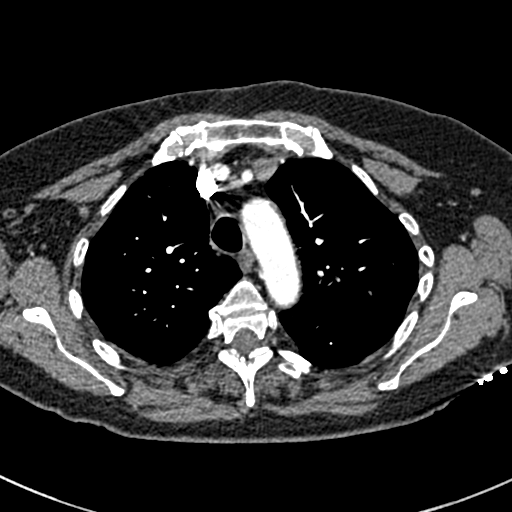
[im 184/223  lung]
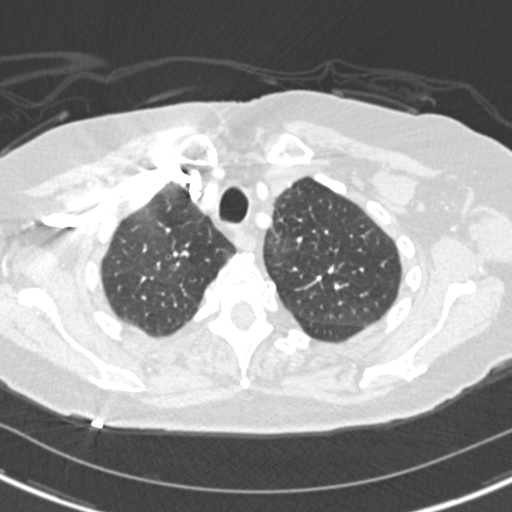
[im 194/223  soft-tissue]
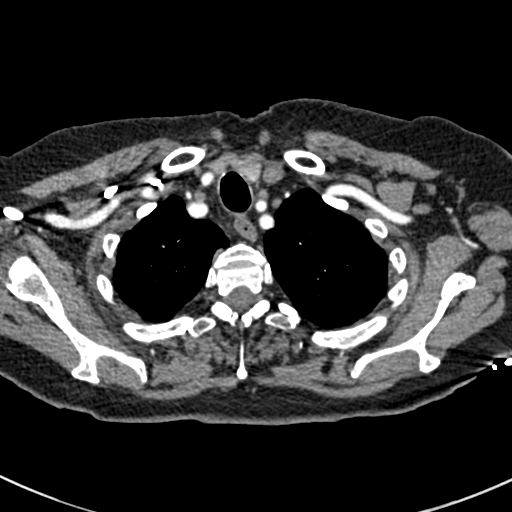
[im 213/223  lung]
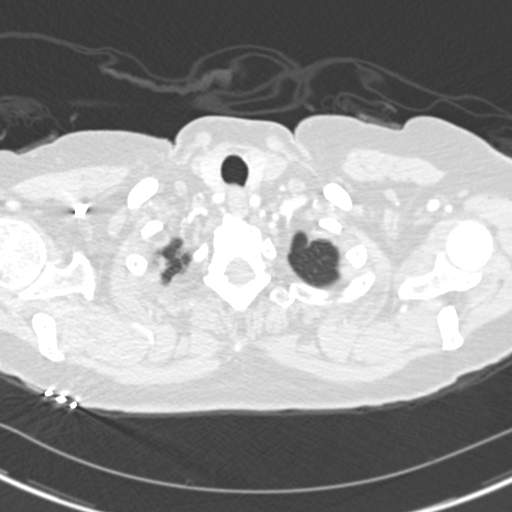

[Series 7: coronal mpr · coronal · 0.45mm/px · 3 of 127 slices shown]
[im 32/127  soft-tissue]
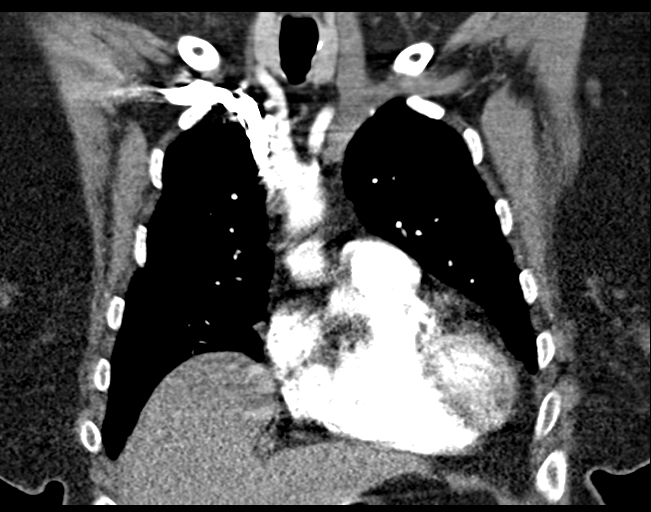
[im 64/127  soft-tissue]
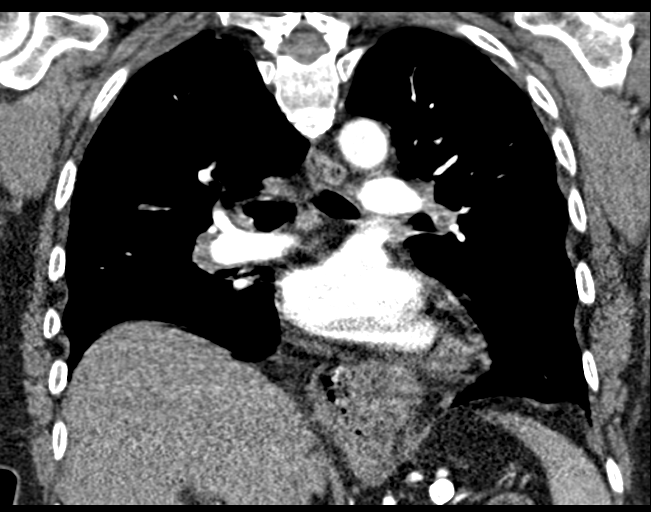
[im 95/127  soft-tissue]
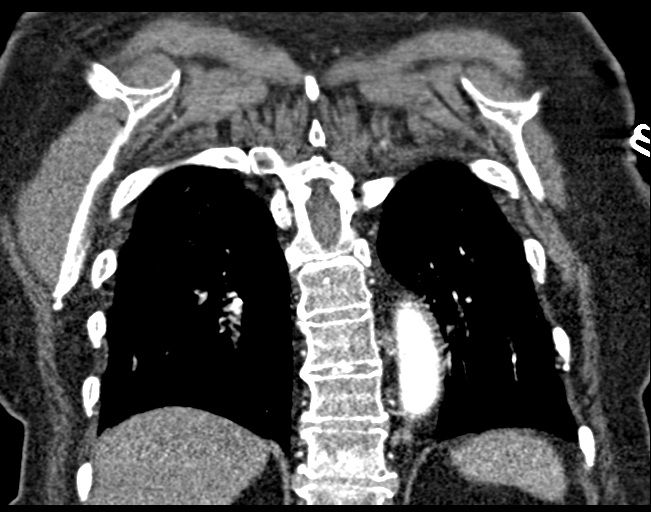

[18 of 46 positions shown; findings below may reference images not displayed]

FINDINGS: Cardiovascular: No filling defects in the pulmonary arteries to
suggest pulmonary emboli. Heart is normal size. Aorta is normal
caliber.

Mediastinum/Nodes: Borderline sized mediastinal and bilateral hilar
lymph nodes. Index AP window lymph node has a short axis diameter of
9 mm. Index right hilar lymph node has a short axis diameter of 11
mm. Large hiatal hernia.

Lungs/Pleura: Peribronchial thickening. Bibasilar and dependent
atelectasis. No confluent opacities otherwise. No effusions.

Upper Abdomen: Imaging into the upper abdomen shows no acute
findings.

Musculoskeletal: Chest wall soft tissues are unremarkable. No acute
bony abnormality.

Review of the MIP images confirms the above findings.
IMPRESSION: Negative for pulmonary embolus.

Bronchial wall thickening with peripheral ground-glass opacities,
similar to prior CT. Favor this represents bronchitis/bronchiolitis.
A component of chronic interstitial lung disease is possible.

Stable borderline mediastinal and hilar lymph nodes, likely
reactive.

Large hiatal hernia.

## 2019-03-26 MED ORDER — SODIUM CHLORIDE (PF) 0.9 % IJ SOLN
INTRAMUSCULAR | Status: AC
  Filled 2019-03-26: qty 50

## 2019-03-26 MED ORDER — PREDNISONE 50 MG PO TABS: ORAL_TABLET | ORAL | 0 refills | Status: DC

## 2019-03-26 MED ORDER — DOXYCYCLINE HYCLATE 100 MG PO CAPS: 100.0000 mg | ORAL_CAPSULE | Freq: Two times a day (BID) | ORAL | 0 refills | Status: DC

## 2019-03-26 MED ORDER — IOHEXOL 350 MG/ML SOLN
100.0000 mL | Freq: Once | INTRAVENOUS | Status: AC | PRN
  Administered 2019-03-26: 100 mL via INTRAVENOUS

## 2019-03-26 NOTE — ED Triage Notes (Signed)
Pt complains of a cough that she's had since April, she has been dx with bronchitis and has been treated with several things and she's not getting any better

## 2019-03-26 NOTE — Discharge Instructions (Addendum)
Take the antibiotics and steroids as prescribed.  Follow-up with your pulmonologist as scheduled.  Return to the ED you develop new or worsening symptoms.

## 2019-03-26 NOTE — ED Provider Notes (Addendum)
[facility] DEPT Provider Note   CSN: [id number] Arrival date & time: 03/26/19  4854     History   Chief Complaint Chief Complaint  Patient presents with  . Cough    HPI Ann [name] is a 73 y.o. female.     Patient here with cough and congestion have been going on since April.  She is seen in this ED before last on July 4.  He has been treated with inhalers, steroids, cough suppressants without relief.  States she comes in tonight because she is in difficulty sleeping because of the cough.  Is worse when she tries to lie down.  Is been ongoing only since the end of April.  She has an appointment with her pulmonologist for the first time next week.  She is a non-smoker but denies any history of asthma or COPD.  She denies any chest pain, fever, chills, nausea or vomiting.  Denies any known coronavirus exposures.  No leg pain or leg swelling.  No history of CAD or heart failure.  Has a hoarse voice which is chronic.   Cough Associated symptoms: rhinorrhea   Associated symptoms: no chest pain, no headaches, no myalgias, no rash and no shortness of breath     Past Medical History:  Diagnosis Date  . Pneumonia   . Sepsis ([place])     There are no active problems to display for this patient.   Past Surgical History:  Procedure Laterality Date  . CHOLECYSTECTOMY       OB History   No obstetric history on file.      Home Medications    Prior to Admission medications   Medication Sig Start Date End Date Taking? Authorizing Provider  albuterol (VENTOLIN HFA) 108 (90 Base) MCG/ACT inhaler Inhale 1-2 puffs into the lungs every 6 (six) hours as needed for wheezing or shortness of breath. 02/16/19   Fredia Sorrow, MD  atorvastatin (LIPITOR) 20 MG tablet Take 20 mg by mouth daily. 12/26/18   [provider]  azithromycin (ZITHROMAX Z-PAK) 250 MG tablet 2 po day one, then 1 daily x 4 days 03/08/19   Veryl Speak, MD  benzonatate  (TESSALON) 100 MG capsule Take 100 mg by mouth daily. 02/13/19   [provider]  estradiol (ESTRACE) 0.1 MG/GM vaginal cream Place 0.5 g vaginally 2 (two) times a week. 11/28/18   [provider]  HYDROcodone-homatropine (HYCODAN) 5-1.5 MG/5ML syrup Take 5 mLs by mouth every 6 (six) hours as needed for cough.    [provider]  ibuprofen (ADVIL) 200 MG tablet Take 400 mg by mouth every 6 (six) hours as needed for moderate pain.    [provider]  lisinopril-hydrochlorothiazide (ZESTORETIC) 20-25 MG tablet Take 1 tablet by mouth daily. 12/26/18   [provider]  mometasone-formoterol (DULERA) 100-5 MCG/ACT AERO Inhale 2 puffs into the lungs 2 (two) times daily.    [provider]  predniSONE (DELTASONE) 10 MG tablet Take 4 tablets (40 mg total) by mouth daily. Patient not taking: Reported on 03/08/2019 02/16/19   Fredia Sorrow, MD    Family History Family History  Problem Relation Age of Onset  . Heart failure Mother   . Osteoporosis Mother   . COPD Father   . Diabetes Father     Social History Social History   Tobacco Use  . Smoking status: Former Research scientist (life sciences)  . Smokeless tobacco: Never Used  . Tobacco comment: 40 years ago  Substance Use Topics  .  Alcohol use: Yes  . Drug use: Never     Allergies   Patient has no known allergies.   Review of Systems Review of Systems  Constitutional: Negative for activity change and appetite change.  HENT: Positive for congestion and rhinorrhea.   Eyes: Negative for visual disturbance.  Respiratory: Positive for cough. Negative for chest tightness and shortness of breath.   Cardiovascular: Negative for chest pain.  Gastrointestinal: Negative for abdominal pain, nausea and vomiting.  Genitourinary: Negative for dysuria and hematuria.  Musculoskeletal: Negative for arthralgias and myalgias.  Skin: Negative for rash.  Neurological: Negative for dizziness, weakness and headaches.   all  other systems are negative except as noted in the HPI and PMH.     Physical Exam Updated Vital Signs BP (!) 161/75 (BP Location: Right Arm)   Pulse (!) 119   Temp 97.7 F (36.5 C) (Oral)   Resp 20   SpO2 95%   Physical Exam Vitals signs and nursing note reviewed.  Constitutional:      General: She is not in acute distress.    Appearance: She is well-developed.     Comments: Hoarse voice, no respiratory distress, speaking full sentences  HENT:     Head: Normocephalic and atraumatic.     Mouth/Throat:     Pharynx: No oropharyngeal exudate.  Eyes:     Conjunctiva/sclera: Conjunctivae normal.     Pupils: Pupils are equal, round, and reactive to light.  Neck:     Musculoskeletal: Normal range of motion and neck supple.     Comments: No meningismus. Cardiovascular:     Rate and Rhythm: Normal rate and regular rhythm.     Heart sounds: Normal heart sounds. No murmur.  Pulmonary:     Effort: Pulmonary effort is normal. No respiratory distress.     Breath sounds: Normal breath sounds.  Chest:     Chest wall: No tenderness.  Abdominal:     Palpations: Abdomen is soft.     Tenderness: There is no abdominal tenderness. There is no guarding or rebound.  Musculoskeletal: Normal range of motion.        General: No tenderness.  Skin:    General: Skin is warm.     Capillary Refill: Capillary refill takes less than 2 seconds.  Neurological:     General: No focal deficit present.     Mental Status: She is alert and oriented to person, place, and time. Mental status is at baseline.     Cranial Nerves: No cranial nerve deficit.     Motor: No abnormal muscle tone.     Coordination: Coordination normal.     Comments:  5/5 strength throughout. CN 2-12 intact.Equal grip strength.   Psychiatric:        Behavior: Behavior normal.      ED Treatments / Results  Labs (all labs ordered are listed, but only abnormal results are displayed) Labs Reviewed  CBC WITH DIFFERENTIAL/PLATELET -  Abnormal; Notable for the following components:      Result Value   Eosinophils Absolute 1.5 (*)    All other components within normal limits  BASIC METABOLIC PANEL - Abnormal; Notable for the following components:   Chloride 97 (*)    Glucose, Bld 121 (*)    All other components within normal limits  D-DIMER, QUANTITATIVE (NOT AT [facility]) - Abnormal; Notable for the following components:   D-Dimer, Quant 2.01 (*)    All other components within normal limits  SARS CORONAVIRUS 2 (HOSPITAL ORDER,  [facility])  TROPONIN I (HIGH SENSITIVITY)  TROPONIN I (HIGH SENSITIVITY)    EKG None  Radiology Dg Chest Portable 1 View  Result Date: 03/26/2019 CLINICAL DATA:  Cough and fatigue EXAM: PORTABLE CHEST 1 VIEW COMPARISON:  02/16/2019 chest CT FINDINGS: Normal heart size and aortic contours. Moderate hiatal hernia. Low volumes with interstitial crowding and coarsening. There is no edema, consolidation, effusion, or pneumothorax. IMPRESSION: No acute or focal finding. Please reference chest CT from last month. Electronically Signed   By: Monte Fantasia M.D.   On: 03/26/2019 04:22    Procedures Procedures (including critical care time)  Medications Ordered in ED Medications - No data to display   Initial Impression / Assessment and Plan / ED Course  I have reviewed the triage vital signs and the nursing notes.  Pertinent labs & imaging results that were available during my care of the patient were reviewed by me and considered in my medical decision making (see chart for details).       Cough since April worsening over the past several days.  Worse at night.  Patient tachycardic but not hypoxic.  Lungs are clear.  Chest x-ray without acute abnormality.  CT chest reviewed from June which shows groundglass opacities and likely interstitial lung disease  D-dimer elevated. CT pending to evaluate for PE. Patient does have some tachycardia.  Dr. Kathrynn Humble to assume  care at shift change. Anticipate discharge home with steroids and antibiotics if CTPE negative. Patient does have pulmonary appointment next week.  Lovina Zuver was evaluated in Emergency Department on 03/26/2019 for the symptoms described in the history of present illness. She was evaluated in the context of the global COVID-19 pandemic, which necessitated consideration that the patient might be at risk for infection with the SARS-CoV-2 virus that causes COVID-19. Institutional protocols and algorithms that pertain to the evaluation of patients at risk for COVID-19 are in a state of rapid change based on information released by regulatory bodies including the CDC and federal and state organizations. These policies and algorithms were followed during the patient's care in the ED.  Final Clinical Impressions(s) / ED Diagnoses   Final diagnoses:  Cough    ED Discharge Orders    None       Maziah Smola, Annie Main, MD 03/26/19 3888    Ezequiel Essex, MD 03/26/19 2131

## 2019-04-01 ENCOUNTER — Other Ambulatory Visit: Payer: Self-pay

## 2019-04-01 ENCOUNTER — Encounter: Payer: Self-pay | Admitting: Pulmonary Disease

## 2019-04-01 ENCOUNTER — Ambulatory Visit (INDEPENDENT_AMBULATORY_CARE_PROVIDER_SITE_OTHER): Payer: Medicare Other | Admitting: Pulmonary Disease

## 2019-04-01 VITALS — BP 118/70 | HR 110 | Temp 98.0°F | Ht 64.0 in | Wt 158.0 lb

## 2019-04-01 DIAGNOSIS — R590 Localized enlarged lymph nodes: Secondary | ICD-10-CM | POA: Diagnosis not present

## 2019-04-01 DIAGNOSIS — I10 Essential (primary) hypertension: Secondary | ICD-10-CM

## 2019-04-01 DIAGNOSIS — R059 Cough, unspecified: Secondary | ICD-10-CM

## 2019-04-01 DIAGNOSIS — R05 Cough: Secondary | ICD-10-CM

## 2019-04-01 DIAGNOSIS — J40 Bronchitis, not specified as acute or chronic: Secondary | ICD-10-CM

## 2019-04-01 MED ORDER — PREDNISONE 10 MG PO TABS: 40.0000 mg | ORAL_TABLET | Freq: Every day | ORAL | 0 refills | Status: DC

## 2019-04-01 MED ORDER — MOMETASONE FURO-FORMOTEROL FUM 100-5 MCG/ACT IN AERO: 2.0000 | INHALATION_SPRAY | Freq: Two times a day (BID) | RESPIRATORY_TRACT | 0 refills | Status: DC

## 2019-04-01 MED ORDER — MOMETASONE FURO-FORMOTEROL FUM 100-5 MCG/ACT IN AERO: 2.0000 | INHALATION_SPRAY | Freq: Two times a day (BID) | RESPIRATORY_TRACT | 6 refills | Status: DC

## 2019-04-01 NOTE — Progress Notes (Signed)
Subjective:   PATIENT ID: Ann [name] GENDER: female DOB: 22-Nov-1945, MRN: [id number]   HPI  Chief Complaint  Patient presents with  . Consult    multiple ED visits, bronchitis    Reason for Visit: New consult for bronchitis  [name] is a 73 year old female with hypertension, HLD, ?hx lung nodule who presents with complaints of persistent cough.  Reviewed PCP records sent by Marilynne Drivers, [facility] associates: Referred to pulmonary for persistent cough and lung nodule. No clinic note. Patient's pulmonary medical history includes abnormal CT chest with documentation of left upper lobe lung nodule "rec repeat CT in 12/20." Patient was started on Dulera and low-dose prednisone (10 mg daily x 4 days) and tessalon perles and hydrocodone-homatropine cough syrup. She is also on lisinopril-HCTZ for her cough.  Today, she reports persistent cough since April 2020. Gradual onset of symptoms that initially started as allergies manifested as congestion and shortness of breath. Now she has productive cough with white/yellowish sputum. Occurs late in the day and has to use a humidier. Symptoms worsen at night or when laying down. Has tried with flonase and zyrtec without improvement.  Improves on antibiotics however returns once completed. Recently presented to ED x 3 with last visit recently in July. She received steroids and feels 75% improvement in symptoms especially her coughing however returns once she stops.  Last treated with steroids in July 2020  No family hx of asthma  Social History: Former smoker. Quit 1976.  Environmental exposures: None  I have personally reviewed patient's past medical/family/social history, allergies, current medications.  Past Medical History:  Diagnosis Date  . Bronchitis, chronic ([place])   . Hyperlipidemia   . Hypertension   . Pneumonia   . Pulmonary nodule   . Sepsis ([place])      Family History  Problem Relation Age of Onset   . Heart failure Mother   . Osteoporosis Mother   . COPD Father   . Diabetes Father   . Osteoporosis Sister   . Osteoporosis Sister      Social History   Occupational History  . Not on file  Tobacco Use  . Smoking status: Former Smoker    Packs/day: 0.10    Years: 12.00    Pack years: 1.20    Start date: 1964    Quit date: 1976    Years since quitting: 44.6  . Smokeless tobacco: Never Used  . Tobacco comment: only smokes socially   Substance and Sexual Activity  . Alcohol use: Yes  . Drug use: Never  . Sexual activity: Not on file    No Known Allergies   Outpatient Medications Prior to Visit  Medication Sig Dispense Refill  . albuterol (VENTOLIN HFA) 108 (90 Base) MCG/ACT inhaler Inhale 1-2 puffs into the lungs every 6 (six) hours as needed for wheezing or shortness of breath. 1 Inhaler 1  . atorvastatin (LIPITOR) 20 MG tablet Take 20 mg by mouth daily.    . benzonatate (TESSALON) 100 MG capsule Take 100 mg by mouth daily.    Marland Kitchen doxycycline (VIBRAMYCIN) 100 MG capsule Take 1 capsule (100 mg total) by mouth 2 (two) times daily. 20 capsule 0  . estradiol (ESTRACE) 0.1 MG/GM vaginal cream Place 0.5 g vaginally 2 (two) times a week.    Marland Kitchen HYDROcodone-homatropine (HYCODAN) 5-1.5 MG/5ML syrup Take 5 mLs by mouth every 6 (six) hours as needed for cough.    Marland Kitchen ibuprofen (ADVIL) 200 MG tablet Take  400 mg by mouth every 6 (six) hours as needed for moderate pain.    Marland Kitchen lisinopril-hydrochlorothiazide (ZESTORETIC) 20-25 MG tablet Take 1 tablet by mouth daily.    . mometasone-formoterol (DULERA) 100-5 MCG/ACT AERO Inhale 2 puffs into the lungs 2 (two) times daily.    . predniSONE (DELTASONE) 50 MG tablet 1 tablet PO daily (Patient not taking: Reported on 04/01/2019) 5 tablet 0  . azithromycin (ZITHROMAX Z-PAK) 250 MG tablet 2 po day one, then 1 daily x 4 days 6 tablet 0   No facility-administered medications prior to visit.     Review of Systems  Constitutional: Negative for chills,  diaphoresis, fever, malaise/fatigue and weight loss.  HENT: Positive for congestion. Negative for ear pain and sore throat.   Respiratory: Positive for cough, sputum production, shortness of breath and wheezing. Negative for hemoptysis.   Cardiovascular: Negative for chest pain, palpitations and leg swelling.  Gastrointestinal: Negative for abdominal pain, heartburn and nausea.  Genitourinary: Negative for frequency.  Musculoskeletal: Negative for joint pain and myalgias.  Skin: Negative for itching and rash.  Neurological: Negative for dizziness, weakness and headaches.  Endo/Heme/Allergies: Does not bruise/bleed easily.  Psychiatric/Behavioral: Negative for depression. The patient is nervous/anxious.      Objective:   Vitals:   04/01/19 1103 04/01/19 1105  BP:  118/70  Pulse:  (!) 110  Temp: 98 F (36.7 C)   TempSrc: Oral   SpO2:  97%  Weight: 158 lb (71.7 kg)   Height: 5\' 4"  (1.626 m)    SpO2: 97 % O2 Device: None (Room air)  Physical Exam: General: Well-appearing, no acute distress HENT: [place], AT, hoarse voice Eyes: EOMI, no scleral icterus Respiratory: Clear to auscultation bilaterally.  No crackles, wheezing or rales Cardiovascular: RRR, -M/R/G, no JVD GI: BS+, soft, nontender Extremities:-Edema,-tenderness Neuro: AAO x4, CNII-XII grossly intact Skin: Intact, no rashes or bruising Psych: Normal mood, normal affect  Data Reviewed:  Imaging: CTA 03/26/19 - No PE, borderline mediastinal (50mm) and right hilar (89mm) lymph nodes, stable compared to 02/16/19  PFT: None on file  Labs: CBC 03/26/19 - 10.1 Abs eos 1500  Imaging, labs and tests noted above have been reviewed independently by me.    Assessment & Plan:  73 year old female with chronic bronchitis who presents for evaluation of cough. Will empirically treated for bronchitis with BD and steroid taper. Will formally evaluate for obstructive lung disease. Can consider trial off ACEI at a later time though her  cough is concerning for underlying lung issues. She does have a component of eosinophilia on labs (no prior hx or labs available). Will need to repeat CBC with diff and RAST at next visit.  Discussion: Chronic Bronchitis  Medications:  RESTART Dulera 2 puffs twice a day. Rinse mouth after each use  CONTINUE Albuterol as needed for shortness of breath or wheezing  Prednisone 40 mg x 10 days (Take five days now.)  Order:  Pulmonary function tests             Sputum culture   IgE  Borderline mediastinal and right hilar lymphadenopathy, suspected to be reactive Will discuss at next visit. Will need repeat imaging 08/2019 for follow-up  Health Maintenance Pneumonia 07/04/16 Influenza 06/18/18  Orders Placed This Encounter  Procedures  . Respiratory or Resp and Sputum Culture    Standing Status:   Future    Number of Occurrences:   1    Standing Expiration Date:   03/31/2020  . IgE  Standing Status:   Future    Number of Occurrences:   1    Standing Expiration Date:   03/31/2020  . Pulmonary function test    Standing Status:   Future    Standing Expiration Date:   03/31/2020    Order Specific Question:   Where should this test be performed?    Answer:   [facility]    Order Specific Question:   Full PFT: includes the following: basic spirometry, spirometry pre & post bronchodilator, diffusion capacity (DLCO), lung volumes    Answer:   Full PFT   Meds ordered this encounter  Medications  . predniSONE (DELTASONE) 10 MG tablet    Sig: Take 4 tablets (40 mg total) by mouth daily with breakfast.    Dispense:  40 tablet    Refill:  0  . mometasone-formoterol (DULERA) 100-5 MCG/ACT AERO    Sig: Inhale 2 puffs into the lungs 2 (two) times daily.    Dispense:  8.8 g    Refill:  0  . mometasone-formoterol (DULERA) 100-5 MCG/ACT AERO    Sig: Inhale 2 puffs into the lungs 2 (two) times daily.    Dispense:  8.8 g    Refill:  6    Return in about 2 months (around 06/02/2019).   [place], MD [facility] 04/01/2019 11:13 AM  Office Number [phone]

## 2019-04-01 NOTE — Patient Instructions (Addendum)
Medications:  RESTART Dulera 2 puffs twice a day. Rinse mouth after each use  CONTINUE Albuterol as needed for shortness of breath or wheezing  Prednisone 40 mg x 10 days (Take five days now.)  Order:  Pulmonary function tests             Sputum culture   IgE  Follow up with Dr. Loanne Drilling in 2 months with PFT

## 2019-04-02 LAB — IGE: IgE (Immunoglobulin E), Serum: 13 kU/L (ref <=114)

## 2019-04-03 ENCOUNTER — Other Ambulatory Visit: Payer: Self-pay

## 2019-04-03 ENCOUNTER — Ambulatory Visit
Admission: RE | Admit: 2019-04-03 | Discharge: 2019-04-03 | Disposition: A | Discharge status: Home / Self Care | Payer: Medicare Other | Source: Ambulatory Visit | Attending: Family Medicine | Admitting: Family Medicine

## 2019-04-03 DIAGNOSIS — E2839 Other primary ovarian failure: Secondary | ICD-10-CM

## 2019-04-03 DIAGNOSIS — M85852 Other specified disorders of bone density and structure, left thigh: Secondary | ICD-10-CM | POA: Diagnosis not present

## 2019-04-03 DIAGNOSIS — Z780 Asymptomatic menopausal state: Secondary | ICD-10-CM | POA: Diagnosis not present

## 2019-04-04 DIAGNOSIS — H25812 Combined forms of age-related cataract, left eye: Secondary | ICD-10-CM | POA: Diagnosis not present

## 2019-04-04 DIAGNOSIS — Z01818 Encounter for other preprocedural examination: Secondary | ICD-10-CM | POA: Diagnosis not present

## 2019-04-04 DIAGNOSIS — H25811 Combined forms of age-related cataract, right eye: Secondary | ICD-10-CM | POA: Diagnosis not present

## 2019-04-04 DIAGNOSIS — H2511 Age-related nuclear cataract, right eye: Secondary | ICD-10-CM | POA: Diagnosis not present

## 2019-04-08 ENCOUNTER — Other Ambulatory Visit: Payer: Medicare Other

## 2019-04-08 DIAGNOSIS — J40 Bronchitis, not specified as acute or chronic: Secondary | ICD-10-CM

## 2019-04-09 DIAGNOSIS — R590 Localized enlarged lymph nodes: Secondary | ICD-10-CM | POA: Insufficient documentation

## 2019-04-09 DIAGNOSIS — E785 Hyperlipidemia, unspecified: Secondary | ICD-10-CM | POA: Insufficient documentation

## 2019-04-09 DIAGNOSIS — I10 Essential (primary) hypertension: Secondary | ICD-10-CM | POA: Insufficient documentation

## 2019-04-09 DIAGNOSIS — J42 Unspecified chronic bronchitis: Secondary | ICD-10-CM | POA: Insufficient documentation

## 2019-04-09 DIAGNOSIS — J40 Bronchitis, not specified as acute or chronic: Secondary | ICD-10-CM | POA: Insufficient documentation

## 2019-04-12 LAB — RESPIRATORY CULTURE OR RESPIRATORY AND SPUTUM CULTURE
MICRO NUMBER:: 734764
RESULT:: NORMAL
SPECIMEN QUALITY:: ADEQUATE

## 2019-04-17 ENCOUNTER — Telehealth: Payer: Self-pay | Admitting: Pulmonary Disease

## 2019-04-17 NOTE — Telephone Encounter (Signed)
Called and spoke with patient. Let her know the sputum culture came back with normal bacteria and no infection per Dr. Loanne Drilling. Patient verbalized understanding. Nothing further needed at this time.

## 2019-05-01 DIAGNOSIS — H2512 Age-related nuclear cataract, left eye: Secondary | ICD-10-CM | POA: Diagnosis not present

## 2019-05-01 DIAGNOSIS — H25812 Combined forms of age-related cataract, left eye: Secondary | ICD-10-CM | POA: Diagnosis not present

## 2019-05-02 DIAGNOSIS — L570 Actinic keratosis: Secondary | ICD-10-CM | POA: Diagnosis not present

## 2019-06-17 DIAGNOSIS — M8580 Other specified disorders of bone density and structure, unspecified site: Secondary | ICD-10-CM | POA: Diagnosis not present

## 2019-06-17 DIAGNOSIS — R05 Cough: Secondary | ICD-10-CM | POA: Diagnosis not present

## 2019-06-17 DIAGNOSIS — R0600 Dyspnea, unspecified: Secondary | ICD-10-CM | POA: Diagnosis not present

## 2019-06-17 DIAGNOSIS — N952 Postmenopausal atrophic vaginitis: Secondary | ICD-10-CM | POA: Diagnosis not present

## 2019-06-17 DIAGNOSIS — E7800 Pure hypercholesterolemia, unspecified: Secondary | ICD-10-CM | POA: Diagnosis not present

## 2019-06-17 DIAGNOSIS — I10 Essential (primary) hypertension: Secondary | ICD-10-CM | POA: Diagnosis not present

## 2019-06-20 ENCOUNTER — Telehealth: Payer: Self-pay | Admitting: Pulmonary Disease

## 2019-06-20 NOTE — Telephone Encounter (Signed)
Spoke with pt, she states she started feeling congested this morning. She was very SOB today and wanted to know if she needed to take prednisone again. She stated Dr. Loanne Drilling stated she should take the first dose (for 5 days) that was given back in July. She has 20 pills left and wants to know should should take it as prescribed before. I will try the app of the day since pt may get worse and JE is not available. Please advise.  Fever? No Chills? No Lost of taste? No No exposure to Covid Cough-minimal coughing  No travel   Patient Instructions by Margaretha Seeds, MD at 04/01/2019 10:45 AM Author: Margaretha Seeds, MD Author Type: Physician Filed: 04/01/2019 12:10 PM  Note Status: Addendum Cosign: Cosign Not Required Encounter Date: 04/01/2019  Editor: Amado Coe, RN (Registered Nurse)  Prior Versions: 1. Margaretha Seeds, MD (Physician) at 04/01/2019 11:59 AM - Signed    Medications:             RESTART Dulera 2 puffs twice a day. Rinse mouth after each use             CONTINUE Albuterol as needed for shortness of breath or wheezing             Prednisone 40 mg x 10 days (Take five days now.)  Order:             Pulmonary function tests             Sputum culture              IgE  Follow up with Dr. Loanne Drilling in 2 months with PFT    predniSONE (DELTASONE) 10 MG tablet [id number]   Order Details Dose: 40 mg Route: Oral Frequency: Daily with breakfast  Dispense Quantity: 40 tablet Refills: 0 Fills remaining: --        Sig: Take 4 tablets (40 mg total) by mouth daily with breakfast.       Written Date: 04/01/19 Expiration Date: 03/31/20    Start Date: 04/01/19 End Date: --         Ordering Provider: Margaretha Seeds, MD DEA #:  [id number] NPI:  [id number]   Authorizing Provider: Margaretha Seeds, MD DEA #:  [id number] NPI:  [id number]   Ordering User:  Amado Coe, RN

## 2019-06-20 NOTE — Telephone Encounter (Signed)
Have her take for additional 5 days now. Use back up rescue inhaler/nebulizer. If not improving need to be seen here for by UC

## 2019-06-20 NOTE — Telephone Encounter (Signed)
Called pt and advised message from the provider. Pt understood and verbalized understanding. Nothing further is needed.    

## 2019-07-21 ENCOUNTER — Telehealth: Payer: Self-pay | Admitting: Pulmonary Disease

## 2019-07-21 NOTE — Telephone Encounter (Signed)
Call returned to patient, confirmed DOB She reports developing a runny nose that has slowly gotten worse, she later developed a cough. Denies that she is coughing up anything. She also reports she has a headache she think is related to sinus/congestoin. She confirms that she is using her Dulera daily and ventolin prn. She reports the congestion has moved to her chest. Offered a visit, declined stating she would rather I ask for recommendations first in case she does not need antibiotics. Patient is audibly hoarse with her voice going in and out. Made aware we would give her a call back with recommendations. Denies that she has tried any OTC and reports she only uses the Hycodan as a last resort.   Beth please advise. Thanks.

## 2019-07-21 NOTE — Telephone Encounter (Signed)
Call returned to patient, made aware of EW recommendations. Able to repeat back instructions. Voiced understanding.   Nothing further needed at this time.

## 2019-07-21 NOTE — Telephone Encounter (Signed)
Take mucinex and delsym twice daily and flonase nasal spray. Let us know if develops fever or purulent mucus. If breathing worsens may need CXR or prednisone taper.

## 2019-07-24 ENCOUNTER — Telehealth: Payer: Self-pay | Admitting: Pulmonary Disease

## 2019-07-24 NOTE — Telephone Encounter (Signed)
Telephone encounter from 11/16, pt called in with acute symptoms and was given recs by provider. Called and spoke to pt. Pt states her cough is no better/no worse. Pt is requesting recs. Video visit offered, pt refused video. Appt made as telephone visit with [name], NP, for 07/25/2019. Pt verbalized understanding and denied any further questions or concerns at this time.

## 2019-07-24 NOTE — Progress Notes (Signed)
Virtual Visit via Telephone Note  I connected with Ann [name] on 07/25/19 at 10:30 AM EST by telephone and verified that I am speaking with the correct person using two identifiers.  Location: Patient: Home Provider: [facility] - [id number], [place], [place], [place] [address]   I discussed the limitations, risks, security and privacy concerns of performing an evaluation and management service by telephone and the availability of in person appointments. I also discussed with the patient that there may be a patient responsible charge related to this service. The patient expressed understanding and agreed to proceed.  Patient consented to consult via telephone: Yes People present and their role in pt care: Pt   History of Present Illness:  73 year old female former smoker followed in our office for consulted on 04/01/2019 for recurrent bronchitis.  Past medical history: Hypertension, hyperlipidemia, mediastinal adenopathy Smoking history: Former smoker.  Quit 1976. Maintenance: Dulera 100 Patient of Dr. Loanne [name]  Chief complaint: Hoarseness, Fatigue   73 year old female former smoker followed in our office for recurrent bronchitis.  Patient with significantly elevated eosinophil count on July/2020 blood work.  Patient is maintained on Dulera 100.  She reports adherence to this.  She has had 2 rounds of prednisone one in July/2020 and one in October/2020.  Patient feels significantly better when on prednisone.  Patient reports that on 07/07/2019 her symptoms started to worsen.  She had increased shortness of breath, fatigue, hoarseness, runny nose, cough.  The cough is now turned productive and is having greenish mucus over the last 7 days.  She denies body aches or chills.  She is afebrile over telephone exam today.  Pulmonary function testing was ordered in July/2020 this is still not been scheduled.  Observations/Objective:  07/25/2019 - temp -  97.5  04/01/2019-IgE-13 03/26/2019-SARS-CoV-2-negative 03/26/2019-CBC with differential-eosinophils relative 15, eosinophils absolute 1.5  03/26/2019-CTA chest-negative for PE, stable borderline mediastinal and hilar lymph nodes, likely reactive, favor that this represents bronchitis or bronchiolitis, component of chronic interstitial lung disease is possible  04/08/2019-respiratory sputum culture-growth of normal oropharyngeal flora  Assessment and Plan:  Bronchitis Likely recurrent flare of asthmatic bronchitis  Plan: Prednisone taper today Doxycycline today Increase Dulera to Dulera 200 Stop Dulera 100 Follow-up in 4 weeks Scheduled for pulmonary function testing and January/2020 Likely may need to consider biologic medications the patient continues to have flares requiring steroids for bronchitis despite current treatment  Eosinophilic leukocytosis Plan: Start a daily antihistamine Start nasal saline rinses Prednisone taper today Increase Dulera to Dulera 200 Office in 4 weeks May need to consider biologic medications in the future based off of significant peripheral eosinophilia   Follow Up Instructions:  Return in about 4 weeks (around 08/22/2019), or if symptoms worsen or fail to improve, for Follow up with Ann [name], Follow up with Dr. Loanne [name].   I discussed the assessment and treatment plan with the patient. The patient was provided an opportunity to ask questions and all were answered. The patient agreed with the plan and demonstrated an understanding of the instructions.   The patient was advised to call back or seek an in-person evaluation if the symptoms worsen or if the condition fails to improve as anticipated.  I provided 28 minutes of non-face-to-face time during this encounter.   Ann Rinne, NP

## 2019-07-25 ENCOUNTER — Ambulatory Visit (INDEPENDENT_AMBULATORY_CARE_PROVIDER_SITE_OTHER): Payer: Medicare Other | Admitting: Pulmonary Disease

## 2019-07-25 ENCOUNTER — Encounter: Payer: Self-pay | Admitting: Pulmonary Disease

## 2019-07-25 ENCOUNTER — Other Ambulatory Visit: Payer: Self-pay

## 2019-07-25 ENCOUNTER — Telehealth: Payer: Self-pay | Admitting: Pulmonary Disease

## 2019-07-25 DIAGNOSIS — D7219 Other eosinophilia: Secondary | ICD-10-CM

## 2019-07-25 DIAGNOSIS — J309 Allergic rhinitis, unspecified: Secondary | ICD-10-CM | POA: Diagnosis not present

## 2019-07-25 DIAGNOSIS — J40 Bronchitis, not specified as acute or chronic: Secondary | ICD-10-CM

## 2019-07-25 MED ORDER — PREDNISONE 10 MG PO TABS: ORAL_TABLET | ORAL | 0 refills | Status: AC

## 2019-07-25 MED ORDER — MOMETASONE FURO-FORMOTEROL FUM 200-5 MCG/ACT IN AERO: 2.0000 | INHALATION_SPRAY | Freq: Two times a day (BID) | RESPIRATORY_TRACT | 5 refills | Status: DC

## 2019-07-25 MED ORDER — DOXYCYCLINE HYCLATE 100 MG PO TABS: 100.0000 mg | ORAL_TABLET | Freq: Two times a day (BID) | ORAL | 0 refills | Status: DC

## 2019-07-25 NOTE — Assessment & Plan Note (Signed)
Likely recurrent flare of asthmatic bronchitis  Plan: Prednisone taper today Doxycycline today Increase Dulera to Dulera 200 Stop Dulera 100 Follow-up in 4 weeks Scheduled for pulmonary function testing and January/2020 Likely may need to consider biologic medications the patient continues to have flares requiring steroids for bronchitis despite current treatment

## 2019-07-25 NOTE — Patient Instructions (Addendum)
You were seen today by Lauraine Rinne, NP  for:   1. Bronchitis  - doxycycline (VIBRA-TABS) 100 MG tablet; Take 1 tablet (100 mg total) by mouth 2 (two) times daily.  Dispense: 14 tablet; Refill: 0 - predniSONE (DELTASONE) 10 MG tablet; Take 4 tablets (40 mg total) by mouth daily with breakfast for 3 days, THEN 3 tablets (30 mg total) daily with breakfast for 3 days, THEN 2 tablets (20 mg total) daily with breakfast for 3 days, THEN 1 tablet (10 mg total) daily with breakfast for 3 days.  Dispense: 30 tablet; Refill: 0  START Dulera 200 >>> 2 puffs in the morning right when you wake up, rinse out your mouth after use, 12 hours later 2 puffs, rinse after use >>> Take this daily, no matter what >>> This is not a rescue inhaler   STOP Dulera 100   Only use your albuterol as a rescue medication to be used if you can't catch your breath by resting or doing a relaxed purse lip breathing pattern.  - The less you use it, the better it will work when you need it. - Ok to use up to 2 puffs  every 4 hours if you must but call for immediate appointment if use goes up over your usual need - Don't leave home without it !!  (think of it like the spare tire for your car)   Please start taking a daily antihistamine:  >>>choose one of: zyrtec, claritin, allegra, or xyzal  >>>these are over the counter medications  >>>can choose generic option  >>>take daily  >>>this medication helps with allergies, post nasal drip, and cough   Start nasal saline rinses twice daily >>> This is over-the-counter, please warm the bottle to lukewarm temperature, shake well >>> Do this every day at least 2 times daily, can increase if needed  We have also worked to get you scheduled for the pulmonary function test that was ordered in July/2020.  We are scheduled to complete this pulmonary function test January/18/2021 at 3 PM.  >>>You will need to be Covid tested on January/15/2021 at 10:05 AM.   >>>Please present to [facility] stay in the right hand [place] for preprocedure testing  2. Allergic rhinitis, unspecified seasonality, unspecified trigger  - doxycycline (VIBRA-TABS) 100 MG tablet; Take 1 tablet (100 mg total) by mouth 2 (two) times daily.  Dispense: 14 tablet; Refill: 0 - predniSONE (DELTASONE) 10 MG tablet; Take 4 tablets (40 mg total) by mouth daily with breakfast for 3 days, THEN 3 tablets (30 mg total) daily with breakfast for 3 days, THEN 2 tablets (20 mg total) daily with breakfast for 3 days, THEN 1 tablet (10 mg total) daily with breakfast for 3 days.  Dispense: 30 tablet; Refill: 0  Please start taking a daily antihistamine:  >>>choose one of: zyrtec, claritin, allegra, or xyzal  >>>these are over the counter medications  >>>can choose generic option  >>>take daily  >>>this medication helps with allergies, post nasal drip, and cough   Start nasal saline rinses twice daily >>> This is over-the-counter, please warm the bottle to lukewarm temperature, shake well >>> Do this every day at least 2 times daily, can increase if needed   3. Eosinophilic leukocytosis, unspecified type  - predniSONE (DELTASONE) 10 MG tablet; Take 4 tablets (40 mg total) by mouth daily with breakfast for 3 days, THEN 3 tablets (30 mg total) daily with breakfast for 3 days, THEN 2  tablets (20 mg total) daily with breakfast for 3 days, THEN 1 tablet (10 mg total) daily with breakfast for 3 days.  Dispense: 30 tablet; Refill: 0  Please start taking a daily antihistamine:  >>>choose one of: zyrtec, claritin, allegra, or xyzal  >>>these are over the counter medications  >>>can choose generic option  >>>take daily  >>>this medication helps with allergies, post nasal drip, and cough   Start nasal saline rinses twice daily >>> This is over-the-counter, please warm the bottle to lukewarm temperature, shake well >>> Do this every day at least 2 times daily, can increase if needed   We recommend  today:  No orders of the defined types were placed in this encounter.  No orders of the defined types were placed in this encounter.  Meds ordered this encounter  Medications  . doxycycline (VIBRA-TABS) 100 MG tablet    Sig: Take 1 tablet (100 mg total) by mouth 2 (two) times daily.    Dispense:  14 tablet    Refill:  0  . predniSONE (DELTASONE) 10 MG tablet    Sig: Take 4 tablets (40 mg total) by mouth daily with breakfast for 3 days, THEN 3 tablets (30 mg total) daily with breakfast for 3 days, THEN 2 tablets (20 mg total) daily with breakfast for 3 days, THEN 1 tablet (10 mg total) daily with breakfast for 3 days.    Dispense:  30 tablet    Refill:  0    Follow Up:    Return in about 4 weeks (around 08/22/2019), or if symptoms worsen or fail to improve, for Follow up with [name] FNP-C, Follow up with Dr. Loanne Drilling.   Please do your part to reduce the spread of COVID-19:      Reduce your risk of any infection  and COVID19 by using the similar precautions used for avoiding the common cold or flu:  Marland Kitchen Wash your hands often with soap and warm water for at least 20 seconds.  If soap and water are not readily available, use an alcohol-based hand sanitizer with at least 60% alcohol.  . If coughing or sneezing, cover your mouth and nose by coughing or sneezing into the elbow areas of your shirt or coat, into a tissue or into your sleeve (not your hands). Langley Gauss A MASK when in public  . Avoid shaking hands with others and consider head nods or verbal greetings only. . Avoid touching your eyes, nose, or mouth with unwashed hands.  . Avoid close contact with people who are sick. . Avoid places or events with large numbers of people in one location, like concerts or sporting events. . If you have some symptoms but not all symptoms, continue to monitor at home and seek medical attention if your symptoms worsen. . If you are having a medical emergency, call 911.   [place] / e-Visit: [email or web address]         [facility]: [facility]: [id number]                   [facility]: [id number]     It is flu season:   >>> Best ways to protect herself from the flu: Receive the yearly flu vaccine, practice good hand hygiene washing with soap and also using hand sanitizer when available, eat a nutritious meals, get adequate rest, hydrate appropriately   Please contact the office if  your symptoms worsen or you have concerns that you are not improving.   Thank you for choosing [facility] for your healthcare, and for allowing [place] to partner with you on your healthcare journey. I am thankful to be able to provide care to you today.   [name] FNP-C

## 2019-07-25 NOTE — Assessment & Plan Note (Signed)
Plan: Start a daily antihistamine Start nasal saline rinses Prednisone taper today Increase Dulera to Dulera 200 Office in 4 weeks May need to consider biologic medications in the future based off of significant peripheral eosinophilia

## 2019-07-25 NOTE — Telephone Encounter (Signed)
Patient called back. She has been scheduled for a F/U with [name] on 12/21 at 9am. Nothing further needed at time of call.

## 2019-07-25 NOTE — Telephone Encounter (Signed)
Patient had a televisit today with [name]. He requested that the patient follow up in 4wks. Called patient but she did not answer. Left message for patient to call back.

## 2019-08-12 DIAGNOSIS — Z03818 Encounter for observation for suspected exposure to other biological agents ruled out: Secondary | ICD-10-CM | POA: Diagnosis not present

## 2019-08-24 NOTE — Progress Notes (Signed)
Virtual Visit via Telephone Note  I connected with Ann [name] on 08/25/19 at  9:00 AM EST by telephone and verified that I am speaking with the correct person using two identifiers.  Location: Patient: Home Provider: Office [facility] - [id number], [place], [place],  [address]   I discussed the limitations, risks, security and privacy concerns of performing an evaluation and management service by telephone and the availability of in person appointments. I also discussed with the patient that there may be a patient responsible charge related to this service. The patient expressed understanding and agreed to proceed.  Patient consented to consult via telephone: Yes People present and their role in pt care: Pt     History of Present Illness:  73 year old female former smoker followed in our office for consulted on 04/01/2019 for recurrent bronchitis.  Past medical history: Hypertension, hyperlipidemia, mediastinal adenopathy Smoking history: Former smoker.  Quit 1976. Maintenance: Dulera 200 Patient of Dr. Loanne Drilling  Chief complaint: 4-week follow-up   73 year old female former smoker completing a 4-week telephonic follow-up with our office.  Patient was treated telephonically with a prednisone taper as well as a increase of in her inhaler to Dulera 200 in November/2020.  Patient completing follow-up today to see how she is doing.  Patient with history of recurrent asthmatic bronchitis.  Blood work in July/2020 shows significant peripheral eosinophilia with an absolute count of 1500.  Patient reporting today that she has gotten better since last being treated.  She reports that typically after being treated with a course of steroids her symptoms worsen again a week later.  She reports that this has not happened yet.  She does feel that the [facility] 200 has helped as well as starting the daily cetirizine 10 mg and sinus rinses which she is doing once or twice daily.   She has upcoming appointment scheduled with [place] in January/2021 with pulmonary function testing.  Overall patient feels that breathing is stable at this time.  Observations/Objective:  04/01/2019-IgE-13 03/26/2019-SARS-CoV-2-negative 03/26/2019-CBC with differential-eosinophils relative 15, eosinophils absolute 1.5  03/26/2019-CTA chest-negative for PE, stable borderline mediastinal and hilar lymph nodes, likely reactive, favor that this represents bronchitis or bronchiolitis, component of chronic interstitial lung disease is possible  04/08/2019-respiratory sputum culture-growth of normal oropharyngeal flora  Social History   Tobacco Use  Smoking Status Former Smoker  . Packs/day: 0.10  . Years: 12.00  . Pack years: 1.20  . Start date: 1964  . Quit date: 9  . Years since quitting: 45.0  Smokeless Tobacco Never Used  Tobacco Comment   only smokes socially    Immunization History  Administered Date(s) Administered  . Influenza, High Dose Seasonal PF 06/18/2018  . Pneumococcal Conjugate-13 07/04/2016      Assessment and Plan:  Allergic rhinitis Plan: Continue a daily antihistamine Continue nasal saline rinses Continue Dulera 200 Follow-up with our office in January/2020 with pulmonary function test May need to consider biologic medications in the future based off of significant peripheral eosinophilia  Bronchitis Improved symptoms over the last 4 weeks  Plan: Continue Dulera 200 Follow-up in January/2021 with pulmonary function test Continue daily antihistamine Continue nasal saline rinses Likely may need to consider biologic medications the patient continues to have flares requiring steroids for bronchitis despite current treatment  Eosinophilic leukocytosis Plan: Continue daily and histamine Continue nasal saline rinses once or twice daily Complete pulmonary function testing in January/2021 May need to consider utilization of biologic medications in the  future if  patient continues to have recurrent flares after completing pulmonary function testing   Follow Up Instructions:  Return in about 4 weeks (around 09/22/2019), or if symptoms worsen or fail to improve, for Follow up with [name] FNP-C, Follow up for PFT.   I discussed the assessment and treatment plan with the patient. The patient was provided an opportunity to ask questions and all were answered. The patient agreed with the plan and demonstrated an understanding of the instructions.   The patient was advised to call back or seek an in-person evaluation if the symptoms worsen or if the condition fails to improve as anticipated.  I provided 22 minutes of non-face-to-face time during this encounter.   Lauraine Rinne, NP

## 2019-08-25 ENCOUNTER — Encounter: Payer: Self-pay | Admitting: Pulmonary Disease

## 2019-08-25 ENCOUNTER — Other Ambulatory Visit: Payer: Self-pay

## 2019-08-25 ENCOUNTER — Ambulatory Visit (INDEPENDENT_AMBULATORY_CARE_PROVIDER_SITE_OTHER): Payer: Medicare Other | Admitting: Pulmonary Disease

## 2019-08-25 DIAGNOSIS — J40 Bronchitis, not specified as acute or chronic: Secondary | ICD-10-CM

## 2019-08-25 DIAGNOSIS — D7219 Other eosinophilia: Secondary | ICD-10-CM | POA: Diagnosis not present

## 2019-08-25 DIAGNOSIS — J309 Allergic rhinitis, unspecified: Secondary | ICD-10-CM | POA: Diagnosis not present

## 2019-08-25 NOTE — Assessment & Plan Note (Signed)
Improved symptoms over the last 4 weeks  Plan: Continue Dulera 200 Follow-up in January/2021 with pulmonary function test Continue daily antihistamine Continue nasal saline rinses Likely may need to consider biologic medications the patient continues to have flares requiring steroids for bronchitis despite current treatment

## 2019-08-25 NOTE — Assessment & Plan Note (Signed)
Plan: Continue a daily antihistamine Continue nasal saline rinses Continue Dulera 200 Follow-up with our office in January/2020 with pulmonary function test May need to consider biologic medications in the future based off of significant peripheral eosinophilia

## 2019-08-25 NOTE — Patient Instructions (Addendum)
You were seen today by Ann Rinne, NP  for:   1. Allergic rhinitis, unspecified seasonality, unspecified trigger  Continue daily antihistamine Continue nasal saline rinses once or twice daily  2. Bronchitis  Dulera 200 >>> 2 puffs in the morning right when you wake up, rinse out your mouth after use, 12 hours later 2 puffs, rinse after use >>> Take this daily, no matter what >>> This is not a rescue inhaler   Complete pulmonary function testing as scheduled in January/2021  3. Eosinophilic leukocytosis, unspecified type  Complete upcoming pulmonary function testing Continue daily antihistamine Continue nasal saline rinses once or twice daily   Follow Up:    Return in about 4 weeks (around 09/22/2019), or if symptoms worsen or fail to improve, for Follow up with Ann Quaker FNP-C, Follow up for PFT.   Please do your part to reduce the spread of COVID-19:      Reduce your risk of any infection  and COVID19 by using the similar precautions used for avoiding the common cold or flu:  Marland Kitchen Wash your hands often with soap and warm water for at least 20 seconds.  If soap and water are not readily available, use an alcohol-based hand sanitizer with at least 60% alcohol.  . If coughing or sneezing, cover your mouth and nose by coughing or sneezing into the elbow areas of your shirt or coat, into a tissue or into your sleeve (not your hands). Langley Gauss A MASK when in public  . Avoid shaking hands with others and consider head nods or verbal greetings only. . Avoid touching your eyes, nose, or mouth with unwashed hands.  . Avoid close contact with people who are sick. . Avoid places or events with large numbers of people in one location, like concerts or sporting events. . If you have some symptoms but not all symptoms, continue to monitor at home and seek medical attention if your symptoms worsen. . If you are having a medical emergency, call 911.   [place] / e-Visit: [email or web address]         [facility]: [facility]: [id number]                   [facility]: [id number]     It is flu season:   >>> Best ways to protect herself from the flu: Receive the yearly flu vaccine, practice good hand hygiene washing with soap and also using hand sanitizer when available, eat a nutritious meals, get adequate rest, hydrate appropriately   Please contact the office if your symptoms worsen or you have concerns that you are not improving.   Thank you for choosing [facility] for your healthcare, and for allowing [place] to partner with you on your healthcare journey. I am thankful to be able to provide care to you today.   Ann Quaker FNP-C

## 2019-08-25 NOTE — Assessment & Plan Note (Signed)
Plan: Continue daily and histamine Continue nasal saline rinses once or twice daily Complete pulmonary function testing in January/2021 May need to consider utilization of biologic medications in the future if patient continues to have recurrent flares after completing pulmonary function testing

## 2019-08-27 ENCOUNTER — Other Ambulatory Visit: Payer: Self-pay | Admitting: Family Medicine

## 2019-08-27 DIAGNOSIS — R911 Solitary pulmonary nodule: Secondary | ICD-10-CM

## 2019-09-01 ENCOUNTER — Ambulatory Visit
Admission: RE | Admit: 2019-09-01 | Discharge: 2019-09-01 | Disposition: A | Discharge status: Home / Self Care | Payer: Medicare Other | Source: Ambulatory Visit | Attending: Family Medicine | Admitting: Family Medicine

## 2019-09-01 DIAGNOSIS — R911 Solitary pulmonary nodule: Secondary | ICD-10-CM

## 2019-09-01 IMAGING — CT CT CHEST W/O CM
2 of 4 series · 11 of 36 positions shown, 13 images · non-contrast
Comparison: [DATE], [DATE]
COMPARISON: [DATE], [DATE]

Addendum:
CLINICAL DATA: Follow-up nodule left upper lobe

EXAM:
CT CHEST WITHOUT CONTRAST
TECHNIQUE: Multidetector CT imaging of the chest was performed following the
standard protocol without IV contrast.

[Series 2: chest 2.00 br40 s3 · axial · 0.57mm/px · z∈[+1555,+1819]mm · 8 of 157 slices shown, 10 images (1 of 2)]
[im 13/157  mediastinal]
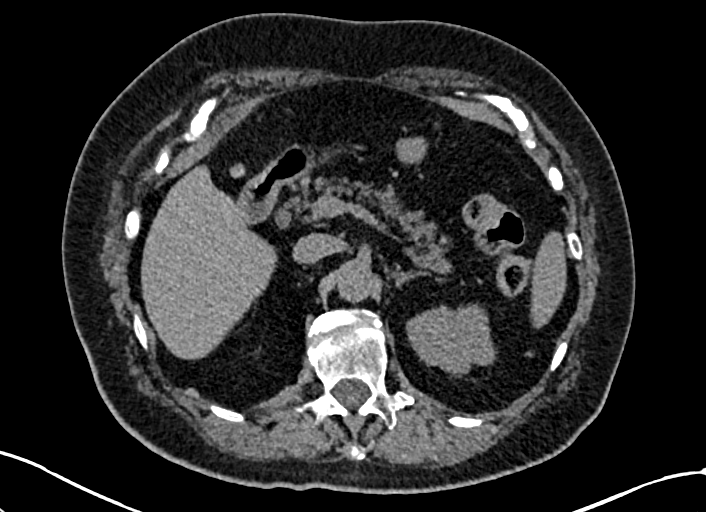
[im 13/157  lung]
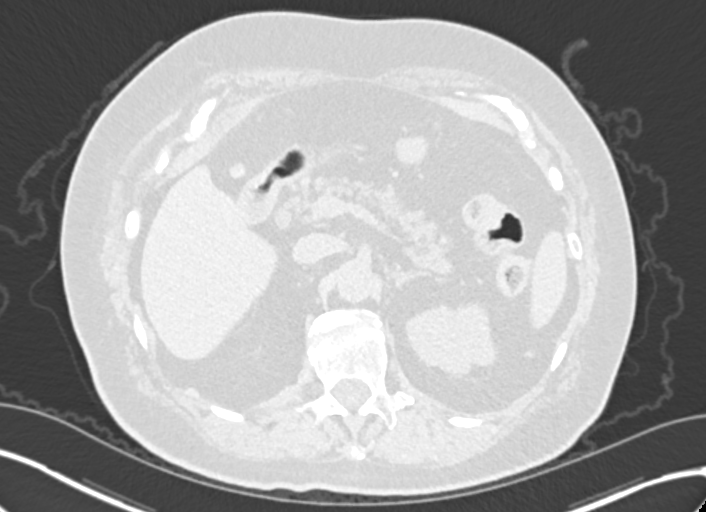
[im 37/157  lung]
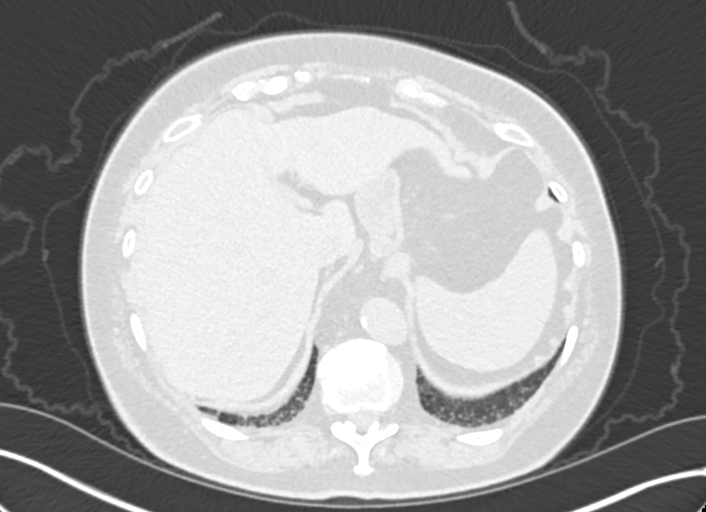
[im 49/157  lung]
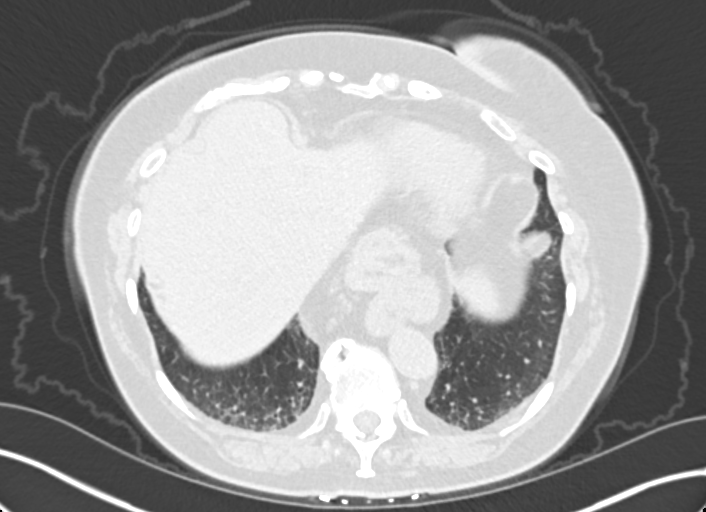
[im 73/157  lung]
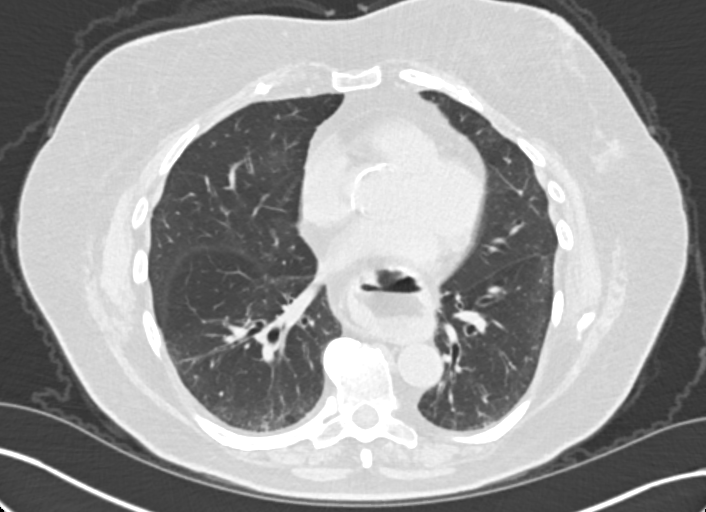
[im 85/157  mediastinal]
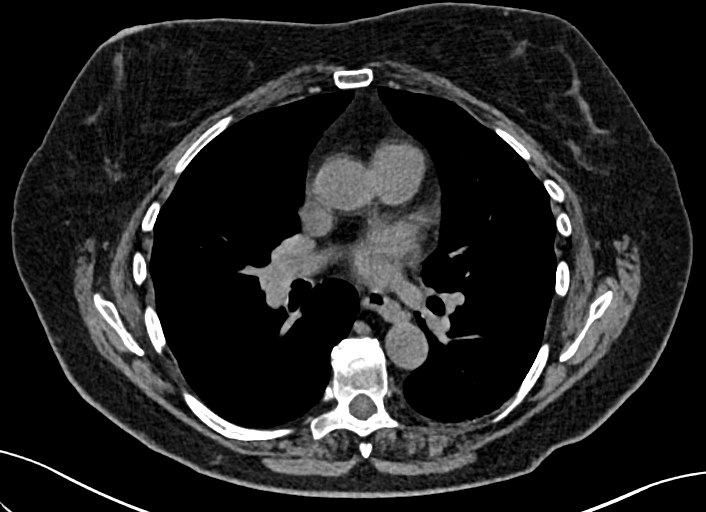
[im 85/157  lung]
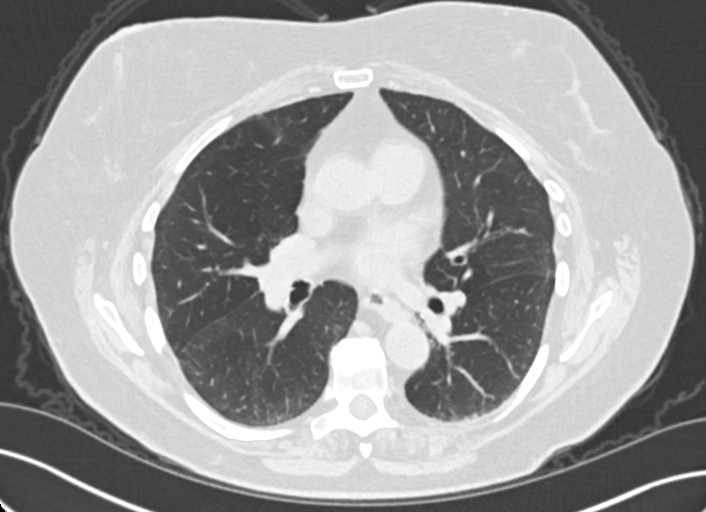
[im 109/157  lung]
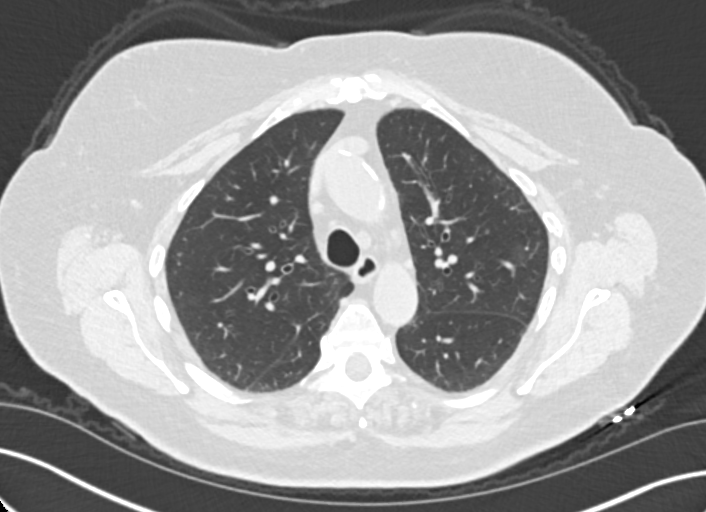
[im 121/157  lung]
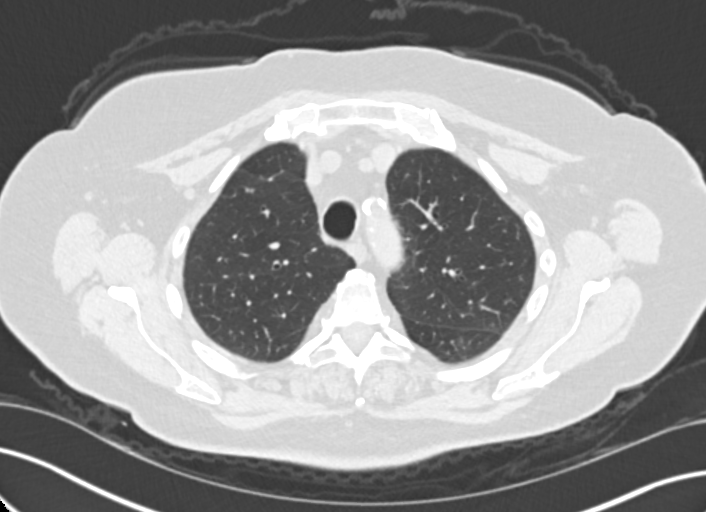
[im 145/157  lung]
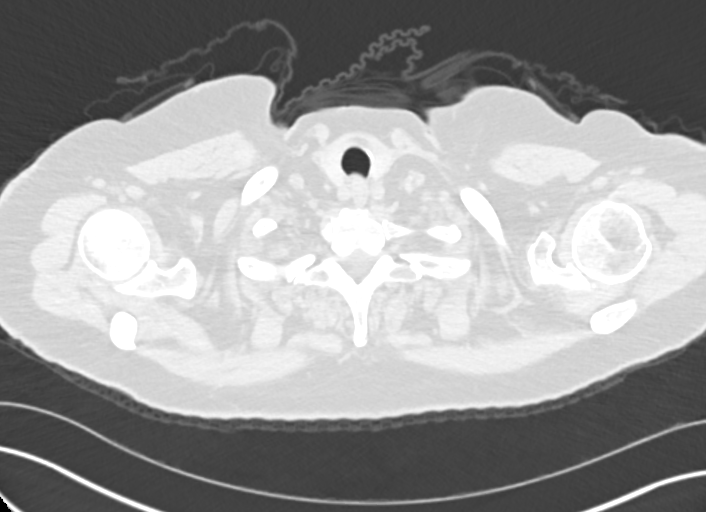

[Series 4: chest 2.00 br40 s3 · coronal · 0.62mm/px · 3 of 146 slices shown (2 of 2)]
[im 30/146  lung]
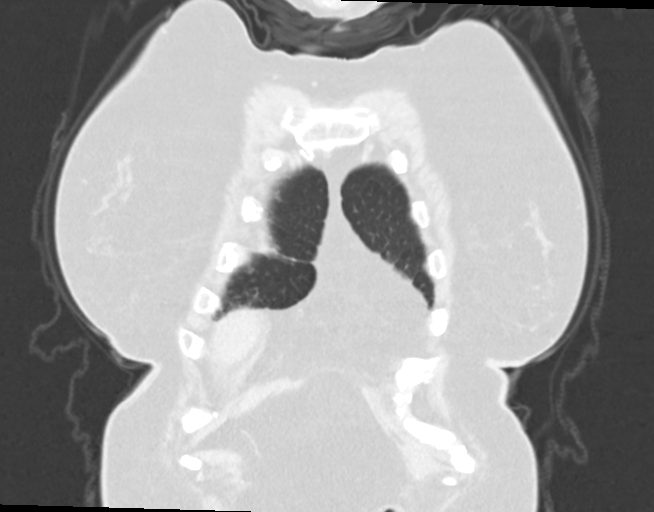
[im 59/146  lung]
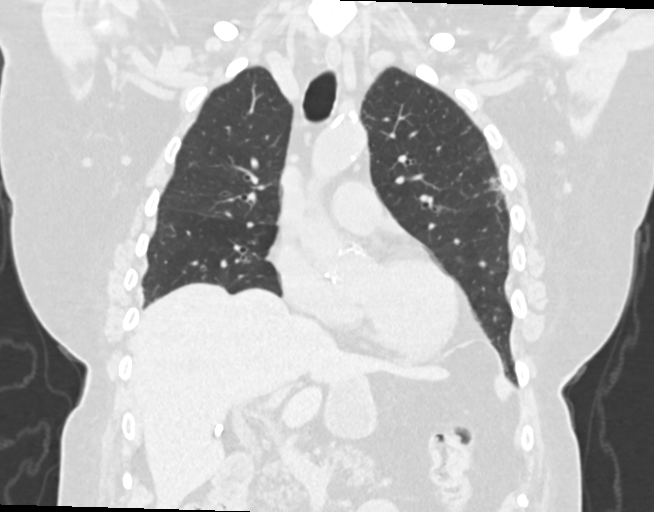
[im 88/146  lung]
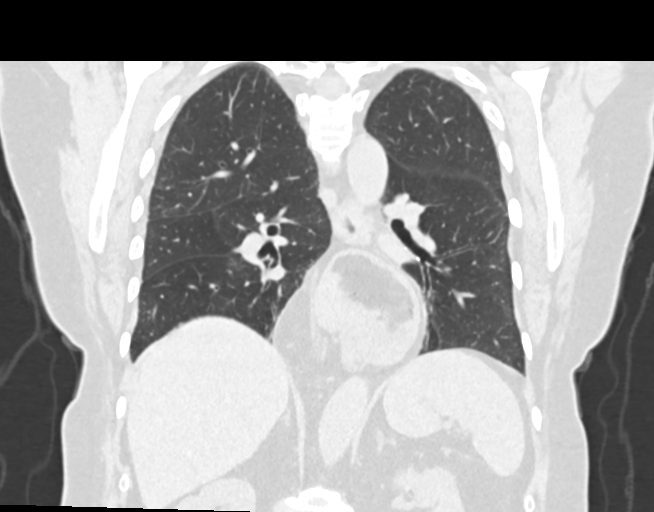

[11 of 36 positions shown; findings below may reference images not displayed]

FINDINGS: Cardiovascular: Somewhat limited due to lack of IV contrast. Aortic
calcifications are noted without aneurysmal dilatation. No
significant coronary calcifications seen. No cardiac enlargement is
noted.

Mediastinum/Nodes: Large paraesophageal hiatal hernia is noted. The
esophagus is otherwise within normal limits. No hilar or mediastinal
adenopathy is noted. Previously seen mildly prominent lymph nodes
have regressed in size. The thoracic inlet is within normal limits.

Lungs/Pleura: The lungs are well aerated bilaterally. Scattered
stable ground-glass opacities are noted bilaterally. Solid component
seen on the prior exam from [DATE] appears to have progressed
somewhat in the interval from the prior exam although the
ground-glass opacity remains. No new solid nodule is seen.

Upper Abdomen: Gallbladder has been surgically removed. No other
focal abnormality in the upper abdomen is seen.

Musculoskeletal: Degenerative changes of the thoracic spine are
seen. No acute bony abnormality is noted.
IMPRESSION: Scattered ground-glass opacities throughout both lungs as well as
some subpleural scarring stable from the previous exam.

Solid component seen in the left upper lobe related to a
ground-glass nodule has regressed when compared with the exam from
[DATE]. Given the regression this is likely benign in
etiology. No new focal nodules are seen.

Large hiatal hernia stable in appearance.

Aortic Atherosclerosis ([5N]-[5N]).

ADDENDUM:
In the body of the report the comment should read: Solid component
seen on the prior exam of [DATE] appears to have regressed
somewhat in the interval from the prior exam although the
ground-glass opacity remains.

*** End of Addendum ***
FINDINGS: Cardiovascular: Somewhat limited due to lack of IV contrast. Aortic
calcifications are noted without aneurysmal dilatation. No
significant coronary calcifications seen. No cardiac enlargement is
noted.

Mediastinum/Nodes: Large paraesophageal hiatal hernia is noted. The
esophagus is otherwise within normal limits. No hilar or mediastinal
adenopathy is noted. Previously seen mildly prominent lymph nodes
have regressed in size. The thoracic inlet is within normal limits.

Lungs/Pleura: The lungs are well aerated bilaterally. Scattered
stable ground-glass opacities are noted bilaterally. Solid component
seen on the prior exam from [DATE] appears to have progressed
somewhat in the interval from the prior exam although the
ground-glass opacity remains. No new solid nodule is seen.

Upper Abdomen: Gallbladder has been surgically removed. No other
focal abnormality in the upper abdomen is seen.

Musculoskeletal: Degenerative changes of the thoracic spine are
seen. No acute bony abnormality is noted.
IMPRESSION: Scattered ground-glass opacities throughout both lungs as well as
some subpleural scarring stable from the previous exam.

Solid component seen in the left upper lobe related to a
ground-glass nodule has regressed when compared with the exam from
[DATE]. Given the regression this is likely benign in
etiology. No new focal nodules are seen.

Large hiatal hernia stable in appearance.

Aortic Atherosclerosis ([5N]-[5N]).

## 2019-09-04 ENCOUNTER — Other Ambulatory Visit: Payer: Medicare Other

## 2019-09-05 HISTORY — PX: EYE SURGERY: SHX253

## 2019-09-19 ENCOUNTER — Other Ambulatory Visit (HOSPITAL_COMMUNITY)
Admission: RE | Admit: 2019-09-19 | Discharge: 2019-09-19 | Disposition: A | Discharge status: Home / Self Care | Payer: Medicare Other | Source: Ambulatory Visit | Attending: Pulmonary Disease | Admitting: Pulmonary Disease

## 2019-09-19 DIAGNOSIS — Z01812 Encounter for preprocedural laboratory examination: Secondary | ICD-10-CM | POA: Diagnosis not present

## 2019-09-19 DIAGNOSIS — Z20822 Contact with and (suspected) exposure to covid-19: Secondary | ICD-10-CM | POA: Diagnosis not present

## 2019-09-19 LAB — SARS CORONAVIRUS 2 (TAT 6-24 HRS): SARS Coronavirus 2: NEGATIVE

## 2019-09-21 NOTE — Progress Notes (Signed)
@Patient  ID: Ann [name], female    DOB: 1946-08-23, 74 y.o.   MRN: [id number]  Chief Complaint  Patient presents with  . Follow-up    F/U after PFT. States her breathing has been stable after the [facility] increase. Has noticed an increase in fatigue.     Referring provider: Lois Huxley, PA  HPI:  74 year old female former smoker followed in our office for consulted on 04/01/2019 for recurrent bronchitis.  Past medical history: Hypertension, hyperlipidemia, mediastinal adenopathy Smoking history: Former smoker.  Quit 1976. Maintenance: Dulera 200 Patient of Dr. Loanne Drilling   09/22/2019  - Visit   74 year old female former smoker followed in our office for recurrent bronchitis.  Patient completing pulmonary function testing today.  Pulmonary function testing results are listed below:  09/22/2019-pulmonary function test-FVC 2.63 (91% predicted), postbronchodilator ratio 79, postbronchodilator FEV1 1.95 (89% predicted), no bronchodilator response, DLCO 14.39 (74% predicted)  Patient is also recently completed a follow-up CT.  Those results are listed below:  09/01/2019-CT chest without contrast-scattered groundglass opacities throughout both lungs as well as some subpleural scarring stable from previous exam, solid component seen in left upper lobe related to groundglass nodule has regressed when compared with the exam from June/2020, given the regression this is likely a benign etiology  Patient reports today that she continues to have ongoing fatigue.  She feels that she is not able to do as much as what she was doing 6 months ago.  She does admit that she has been more sedentary due to the COVID-19 pandemic.  She feels that her breathing has been fairly stable since last being telephonically seen in our office in December/2020.  She feels that the [facility] 200 is the appropriate dose for her.  She is also reporting today that she has some bilateral arm pain.  She has noticed this  for a couple of months.  She does not feel this affects her ADLs.  But it is definitely present.  She is unable to tell me if this improves with steroids or not.   Tests:    04/01/2019-IgE-13 03/26/2019-SARS-CoV-2-negative 03/26/2019-CBC with differential-eosinophils relative 15, eosinophils absolute 1.5  03/26/2019-CTA chest-negative for PE, stable borderline mediastinal and hilar lymph nodes, likely reactive, favor that this represents bronchitis or bronchiolitis, component of chronic interstitial lung disease is possible  04/08/2019-respiratory sputum culture-growth of normal oropharyngeal flora  09/22/2019-pulmonary function test-FVC 2.63 (91% predicted), postbronchodilator ratio 79, postbronchodilator FEV1 1.95 (89% predicted), no bronchodilator response, DLCO 14.39 (74% predicted)  09/01/2019-CT chest without contrast-scattered groundglass opacities throughout both lungs as well as some subpleural scarring stable from previous exam, solid component seen in left upper lobe related to groundglass nodule has regressed when compared with the exam from June/2020, given the regression this is likely a benign etiology  FENO:  No results found for: NITRICOXIDE  PFT: PFT Results Latest Ref Rng & Units 09/22/2019  FVC-Pre L 2.63  FVC-Predicted Pre % 91  FVC-Post L 2.46  FVC-Predicted Post % 85  Pre FEV1/FVC % % 76  Post FEV1/FCV % % 79  FEV1-Pre L 2.00  FEV1-Predicted Pre % 92  FEV1-Post L 1.95  DLCO UNC% % 74  DLCO COR %Predicted % 88  TLC L 3.89  TLC % Predicted % 76  RV % Predicted % 60    WALK:  No flowsheet data found.  Imaging: CT CHEST WO CONTRAST  Addendum Date: 09/08/2019   ADDENDUM REPORT: 09/08/2019 15:13 ADDENDUM: In the body of the report the comment should read:  Solid component seen on the prior exam of June 2020 appears to have regressed somewhat in the interval from the prior exam although the ground-glass opacity remains. Electronically Signed   By: Inez Catalina M.D.    On: 09/08/2019 15:13   Result Date: 09/08/2019 CLINICAL DATA:  Follow-up nodule left upper lobe EXAM: CT CHEST WITHOUT CONTRAST TECHNIQUE: Multidetector CT imaging of the chest was performed following the standard protocol without IV contrast. COMPARISON:  02/16/2019, 03/26/2019 FINDINGS: Cardiovascular: Somewhat limited due to lack of IV contrast. Aortic calcifications are noted without aneurysmal dilatation. No significant coronary calcifications seen. No cardiac enlargement is noted. Mediastinum/Nodes: Large paraesophageal hiatal hernia is noted. The esophagus is otherwise within normal limits. No hilar or mediastinal adenopathy is noted. Previously seen mildly prominent lymph nodes have regressed in size. The thoracic inlet is within normal limits. Lungs/Pleura: The lungs are well aerated bilaterally. Scattered stable ground-glass opacities are noted bilaterally. Solid component seen on the prior exam from June of 2020 appears to have progressed somewhat in the interval from the prior exam although the ground-glass opacity remains. No new solid nodule is seen. Upper Abdomen: Gallbladder has been surgically removed. No other focal abnormality in the upper abdomen is seen. Musculoskeletal: Degenerative changes of the thoracic spine are seen. No acute bony abnormality is noted. IMPRESSION: Scattered ground-glass opacities throughout both lungs as well as some subpleural scarring stable from the previous exam. Solid component seen in the left upper lobe related to a ground-glass nodule has regressed when compared with the exam from June of 2020. Given the regression this is likely benign in etiology. No new focal nodules are seen. Large hiatal hernia stable in appearance. Aortic Atherosclerosis (ICD10-I70.0). Electronically Signed: By: Inez Catalina M.D. On: 09/01/2019 14:48    Lab Results:  CBC    Component Value Date/Time   WBC 10.1 03/26/2019 0423   RBC 4.55 03/26/2019 0423   HGB 14.1 03/26/2019 0423    HCT 41.5 03/26/2019 0423   PLT 244 03/26/2019 0423   MCV 91.2 03/26/2019 0423   MCH 31.0 03/26/2019 0423   MCHC 34.0 03/26/2019 0423   RDW 12.8 03/26/2019 0423   LYMPHSABS 1.3 03/26/2019 0423   MONOABS 0.6 03/26/2019 0423   EOSABS 1.5 (H) 03/26/2019 0423   BASOSABS 0.1 03/26/2019 0423    BMET    Component Value Date/Time   NA 136 03/26/2019 0423   K 3.5 03/26/2019 0423   CL 97 (L) 03/26/2019 0423   CO2 26 03/26/2019 0423   GLUCOSE 121 (H) 03/26/2019 0423   BUN 15 03/26/2019 0423   CREATININE 0.60 03/26/2019 0423   CALCIUM 9.1 03/26/2019 0423   GFRNONAA >60 03/26/2019 0423   GFRAA >60 03/26/2019 0423    BNP No results found for: BNP  ProBNP No results found for: PROBNP  Specialty Problems      Pulmonary Problems   Bronchitis   Allergic rhinitis    Plan:  Start Nasal Rinses  Start a daily antihistamine Follow-up in our office in 4 weeks Prednisone today       Interstitial pulmonary disease ([place])      No Known Allergies  Immunization History  Administered Date(s) Administered  . Influenza, High Dose Seasonal PF 06/18/2018  . Pneumococcal Conjugate-13 07/04/2016    Past Medical History:  Diagnosis Date  . Bronchitis, chronic ([place])   . Hyperlipidemia   . Hypertension   . Pneumonia   . Pulmonary nodule   . Sepsis ([place])     Tobacco  History: Social History   Tobacco Use  Smoking Status Former Smoker  . Packs/day: 0.10  . Years: 12.00  . Pack years: 1.20  . Start date: 1964  . Quit date: 101  . Years since quitting: 45.0  Smokeless Tobacco Never Used  Tobacco Comment   only smokes socially    Counseling given: Yes Comment: only smokes socially    Continue to not smoke  Outpatient Encounter Medications as of 09/22/2019  Medication Sig  . albuterol (VENTOLIN HFA) 108 (90 Base) MCG/ACT inhaler Inhale 1-2 puffs into the lungs every 6 (six) hours as needed for wheezing or shortness of breath.  Marland Kitchen atorvastatin (LIPITOR) 20 MG tablet Take 20  mg by mouth daily.  Marland Kitchen estradiol (ESTRACE) 0.1 MG/GM vaginal cream Place 0.5 g vaginally 2 (two) times a week.  Marland Kitchen ibuprofen (ADVIL) 200 MG tablet Take 400 mg by mouth every 6 (six) hours as needed for moderate pain.  Marland Kitchen lisinopril-hydrochlorothiazide (ZESTORETIC) 20-25 MG tablet Take 1 tablet by mouth daily.  . mometasone-formoterol (DULERA) 200-5 MCG/ACT AERO Inhale 2 puffs into the lungs 2 (two) times daily.  . [DISCONTINUED] benzonatate (TESSALON) 100 MG capsule Take 100 mg by mouth daily.  . [DISCONTINUED] doxycycline (VIBRA-TABS) 100 MG tablet Take 1 tablet (100 mg total) by mouth 2 (two) times daily.  . [DISCONTINUED] HYDROcodone-homatropine (HYCODAN) 5-1.5 MG/5ML syrup Take 5 mLs by mouth every 6 (six) hours as needed for cough.   No facility-administered encounter medications on file as of 09/22/2019.     Review of Systems  Review of Systems  Constitutional: Positive for fatigue. Negative for activity change and fever.  HENT: Negative for sinus pressure, sinus pain and sore throat.   Respiratory: Negative for cough, shortness of breath and wheezing.   Cardiovascular: Negative for chest pain and palpitations.  Gastrointestinal: Negative for diarrhea, nausea and vomiting.  Musculoskeletal: Positive for arthralgias and myalgias.  Neurological: Negative for dizziness.  Psychiatric/Behavioral: Negative for sleep disturbance. The patient is not nervous/anxious.      Physical Exam  BP 120/76 (BP Location: Left Arm, Patient Position: Sitting, Cuff Size: Normal)   Pulse 89   Temp (!) 97.2 F (36.2 C) (Temporal)   Ht 5' 4.25" (1.632 m)   Wt 170 lb 12.8 oz (77.5 kg)   SpO2 96%   BMI 29.09 kg/m   Wt Readings from Last 5 Encounters:  09/22/19 170 lb 12.8 oz (77.5 kg)  04/01/19 158 lb (71.7 kg)  03/08/19 165 lb (74.8 kg)  02/16/19 165 lb (74.8 kg)    BMI Readings from Last 5 Encounters:  09/22/19 29.09 kg/m  04/01/19 27.12 kg/m  03/08/19 28.32 kg/m  02/16/19 28.32 kg/m      Physical Exam Vitals and nursing note reviewed.  Constitutional:      General: She is not in acute distress.    Appearance: She is normal weight.  HENT:     Head: Normocephalic and atraumatic.     Right Ear: Tympanic membrane, ear canal and external ear normal. There is no impacted cerumen.     Left Ear: Tympanic membrane, ear canal and external ear normal. There is no impacted cerumen.     Nose: Rhinorrhea present. No congestion.     Mouth/Throat:     Mouth: Mucous membranes are moist.     Pharynx: Oropharynx is clear.     Comments: Postnasal drip Eyes:     Pupils: Pupils are equal, round, and reactive to light.  Cardiovascular:     Rate and  Rhythm: Normal rate and regular rhythm.     Pulses: Normal pulses.     Heart sounds: Normal heart sounds. No murmur.  Pulmonary:     Effort: Pulmonary effort is normal. No respiratory distress.     Breath sounds: Normal breath sounds. No decreased air movement. No decreased breath sounds, wheezing or rales.  Abdominal:     General: Abdomen is flat. Bowel sounds are normal.     Palpations: Abdomen is soft.  Musculoskeletal:        General: No swelling or tenderness.     Cervical back: Normal range of motion.     Right lower leg: No edema.     Left lower leg: No edema.     Comments: Equal strength bilaterally in upper extremities  Skin:    General: Skin is warm and dry.     Capillary Refill: Capillary refill takes less than 2 seconds.  Neurological:     General: No focal deficit present.     Mental Status: She is alert and oriented to person, place, and time. Mental status is at baseline.     Gait: Gait normal.  Psychiatric:        Mood and Affect: Mood normal.        Behavior: Behavior normal.        Thought Content: Thought content normal.        Judgment: Judgment normal.       Assessment & Plan:   Interstitial pulmonary disease ([place]) Plan: Lab work today Repeat high-resolution CT chest in 6 months We will  coordinate follow-up with Dr. Loanne Drilling  Bronchitis Plan: Continue Dulera 200 Continue daily and histamine Continue Singulair   Abnormal findings on diagnostic imaging of lung Plan: High-resolution CT chest in 6 months Lab work today    Return in about 6 weeks (around 11/03/2019), or if symptoms worsen or fail to improve, for Follow up with Dr. Loanne Drilling.   Lauraine Rinne, NP 09/22/2019   This appointment required 41 minutes of patient care (this includes precharting, chart review, review of results, face-to-face care, etc.).

## 2019-09-22 ENCOUNTER — Ambulatory Visit (INDEPENDENT_AMBULATORY_CARE_PROVIDER_SITE_OTHER): Payer: Medicare Other | Admitting: Pulmonary Disease

## 2019-09-22 ENCOUNTER — Other Ambulatory Visit: Payer: Self-pay

## 2019-09-22 ENCOUNTER — Encounter: Payer: Self-pay | Admitting: Pulmonary Disease

## 2019-09-22 VITALS — BP 120/76 | HR 89 | Temp 97.2°F | Ht 64.25 in | Wt 170.8 lb

## 2019-09-22 DIAGNOSIS — J40 Bronchitis, not specified as acute or chronic: Secondary | ICD-10-CM

## 2019-09-22 DIAGNOSIS — R918 Other nonspecific abnormal finding of lung field: Secondary | ICD-10-CM | POA: Diagnosis not present

## 2019-09-22 DIAGNOSIS — J849 Interstitial pulmonary disease, unspecified: Secondary | ICD-10-CM | POA: Diagnosis not present

## 2019-09-22 LAB — PULMONARY FUNCTION TEST
DL/VA % pred: 88 %
DL/VA: 3.62 ml/min/mmHg/L
DLCO unc % pred: 74 %
DLCO unc: 14.39 ml/min/mmHg
FEF 25-75 Post: 1.34 L/sec
FEF 25-75 Pre: 1.6 L/sec
FEF2575-%Change-Post: -16 %
FEF2575-%Pred-Post: 77 %
FEF2575-%Pred-Pre: 92 %
FEV1-%Change-Post: -2 %
FEV1-%Pred-Post: 89 %
FEV1-%Pred-Pre: 92 %
FEV1-Post: 1.95 L
FEV1-Pre: 2 L
FEV1FVC-%Change-Post: 3 %
FEV1FVC-%Pred-Pre: 101 %
FEV6-%Change-Post: -6 %
FEV6-%Pred-Post: 89 %
FEV6-%Pred-Pre: 95 %
FEV6-Post: 2.46 L
FEV6-Pre: 2.62 L
FEV6FVC-%Change-Post: 0 %
FEV6FVC-%Pred-Post: 105 %
FEV6FVC-%Pred-Pre: 104 %
FVC-%Change-Post: -6 %
FVC-%Pred-Post: 85 %
FVC-%Pred-Pre: 91 %
FVC-Post: 2.46 L
FVC-Pre: 2.63 L
Post FEV1/FVC ratio: 79 %
Post FEV6/FVC ratio: 100 %
Pre FEV1/FVC ratio: 76 %
Pre FEV6/FVC Ratio: 100 %
RV % pred: 60 %
RV: 1.37 L
TLC % pred: 76 %
TLC: 3.89 L

## 2019-09-22 NOTE — Assessment & Plan Note (Signed)
Plan: Continue Dulera 200 Continue daily and histamine Continue Singulair

## 2019-09-22 NOTE — Assessment & Plan Note (Signed)
Plan: High-resolution CT chest in 6 months Lab work today

## 2019-09-22 NOTE — Progress Notes (Signed)
PFT done today. 

## 2019-09-22 NOTE — Progress Notes (Signed)
Agree with plan. Autoimmune work-up. Repeat CT Chest in 6 months.  Rodman Pickle, M.D. [facility] Pulmonary/Critical Care Medicine 09/22/2019 6:41 PM

## 2019-09-22 NOTE — Assessment & Plan Note (Signed)
Plan: Lab work today Repeat high-resolution CT chest in 6 months We will coordinate follow-up with Dr. Loanne Drilling

## 2019-09-22 NOTE — Patient Instructions (Addendum)
You were seen today by Lauraine Rinne, NP  for:   1. Abnormal findings on diagnostic imaging of lung  - Rheumatoid Factor; Future - Cyclic citrul peptide antibody, IgG; Future - Myositis Assessr Plus Jo-1 Autoabs; Future - ANA,IFA RA Diag Pnl w/rflx Tit/Patn; Future - ANA+ENA+DNA/DS+Scl 70+SjoSSA/B; Future - CT Chest High Resolution; Future  2.  Bronchitis  Dulera 200 >>> 2 puffs in the morning right when you wake up, rinse out your mouth after use, 12 hours later 2 puffs, rinse after use >>> Take this daily, no matter what >>> This is not a rescue inhaler      We recommend today:  Orders Placed This Encounter  Procedures  . CT Chest High Resolution    -High-res CT with supine and prone positioning -inspiratory and expiratory cuts.  -Only to be read by Dr. Rosario Jacks and Dr. Weber Cooks.    Standing Status:   Future    Standing Expiration Date:   11/19/2020    Scheduling Instructions:     Complete June /2021    Order Specific Question:   Preferred imaging location?    Answer:   [place]    Order Specific Question:   Radiology Contrast Protocol - do NOT remove file path    Answer:   [email or web address]  . Rheumatoid Factor    Standing Status:   Future    Standing Expiration Date:   09/21/2020  . Cyclic citrul peptide antibody, IgG    Standing Status:   Future    Standing Expiration Date:   09/21/2020  . Myositis Assessr Plus Jo-1 Autoabs    Standing Status:   Future    Standing Expiration Date:   09/21/2020  . ANA,IFA RA Diag Pnl w/rflx Tit/Patn    Standing Status:   Future    Standing Expiration Date:   09/21/2020  . ANA+ENA+DNA/DS+Scl 70+SjoSSA/B    Standing Status:   Future    Standing Expiration Date:   09/21/2020   Orders Placed This Encounter  Procedures  . CT Chest High Resolution  . Rheumatoid Factor  . Cyclic citrul peptide antibody, IgG  . Myositis Assessr Plus Jo-1 Autoabs  . ANA,IFA RA Diag Pnl w/rflx Tit/Patn  .  ANA+ENA+DNA/DS+Scl 70+SjoSSA/B   No orders of the defined types were placed in this encounter.   Follow Up:    Return in about 6 weeks (around 11/03/2019), or if symptoms worsen or fail to improve, for Follow up with Dr. Loanne Drilling.   Please do your part to reduce the spread of COVID-19:      Reduce your risk of any infection  and COVID19 by using the similar precautions used for avoiding the common cold or flu:  Marland Kitchen Wash your hands often with soap and warm water for at least 20 seconds.  If soap and water are not readily available, use an alcohol-based hand sanitizer with at least 60% alcohol.  . If coughing or sneezing, cover your mouth and nose by coughing or sneezing into the elbow areas of your shirt or coat, into a tissue or into your sleeve (not your hands). Langley Gauss A MASK when in public  . Avoid shaking hands with others and consider head nods or verbal greetings only. . Avoid touching your eyes, nose, or mouth with unwashed hands.  . Avoid close contact with people who are sick. . Avoid places or events with large numbers of people in one location, like concerts or sporting events. . If you have some  symptoms but not all symptoms, continue to monitor at home and seek medical attention if your symptoms worsen. . If you are having a medical emergency, call 911.   [place] / e-Visit: [email or web address]         [facility]: [facility]: [id number]                   [facility]: [id number]     It is flu season:   >>> Best ways to protect herself from the flu: Receive the yearly flu vaccine, practice good hand hygiene washing with soap and also using hand sanitizer when available, eat a nutritious meals, get adequate rest, hydrate appropriately   Please contact the office if your symptoms worsen or you have concerns that  you are not improving.   Thank you for choosing [facility] for your healthcare, and for allowing [place] to partner with you on your healthcare journey. I am thankful to be able to provide care to you today.   [name] FNP-C

## 2019-09-24 LAB — ANA+ENA+DNA/DS+SCL 70+SJOSSA/B
ANA Titer 1: POSITIVE — AB
ENA RNP Ab: <0.2 AI (ref 0.0–0.9)
ENA SM Ab Ser-aCnc: <0.2 AI (ref 0.0–0.9)
ENA SSA (RO) Ab: <0.2 AI (ref 0.0–0.9)
ENA SSB (LA) Ab: <0.2 AI (ref 0.0–0.9)
Scleroderma (Scl-70) (ENA) Antibody, IgG: <0.2 AI (ref 0.0–0.9)
dsDNA Ab: <1 IU/mL (ref 0–9)

## 2019-09-24 LAB — FANA STAINING PATTERNS: Homogeneous Pattern: 1:160 {titer} — ABNORMAL HIGH

## 2019-09-26 ENCOUNTER — Other Ambulatory Visit: Payer: Self-pay

## 2019-09-26 DIAGNOSIS — R768 Other specified abnormal immunological findings in serum: Secondary | ICD-10-CM

## 2019-10-01 DIAGNOSIS — N390 Urinary tract infection, site not specified: Secondary | ICD-10-CM | POA: Diagnosis not present

## 2019-10-08 DIAGNOSIS — M6281 Muscle weakness (generalized): Secondary | ICD-10-CM | POA: Diagnosis not present

## 2019-10-08 DIAGNOSIS — R768 Other specified abnormal immunological findings in serum: Secondary | ICD-10-CM | POA: Diagnosis not present

## 2019-10-08 DIAGNOSIS — L539 Erythematous condition, unspecified: Secondary | ICD-10-CM | POA: Diagnosis not present

## 2019-10-08 DIAGNOSIS — M359 Systemic involvement of connective tissue, unspecified: Secondary | ICD-10-CM | POA: Diagnosis not present

## 2019-10-08 DIAGNOSIS — R5383 Other fatigue: Secondary | ICD-10-CM | POA: Diagnosis not present

## 2019-10-08 DIAGNOSIS — J42 Unspecified chronic bronchitis: Secondary | ICD-10-CM | POA: Diagnosis not present

## 2019-10-08 DIAGNOSIS — J849 Interstitial pulmonary disease, unspecified: Secondary | ICD-10-CM | POA: Diagnosis not present

## 2019-10-08 DIAGNOSIS — R682 Dry mouth, unspecified: Secondary | ICD-10-CM | POA: Diagnosis not present

## 2019-10-08 DIAGNOSIS — M79603 Pain in arm, unspecified: Secondary | ICD-10-CM | POA: Diagnosis not present

## 2019-10-08 DIAGNOSIS — D8989 Other specified disorders involving the immune mechanism, not elsewhere classified: Secondary | ICD-10-CM | POA: Diagnosis not present

## 2019-10-09 LAB — MYOSITIS ASSESSR PLUS JO-1 AUTOABS
EJ Autoabs: NOT DETECTED
Jo-1 Autoabs: <1 AI
Ku Autoabs: NOT DETECTED
Mi-2 Autoabs: NOT DETECTED
OJ Autoabs: NOT DETECTED
PL-12 Autoabs: NOT DETECTED
PL-7 Autoabs: NOT DETECTED
SRP Autoabs: NOT DETECTED

## 2019-10-09 LAB — ANA,IFA RA DIAG PNL W/RFLX TIT/PATN
Anti Nuclear Antibody (ANA): POSITIVE — AB
Cyclic Citrullin Peptide Ab: <16 UNITS
Rheumatoid fact SerPl-aCnc: <14 IU/mL (ref <14)

## 2019-10-09 LAB — ANTI-NUCLEAR AB-TITER (ANA TITER): ANA Titer 1: 1:160 {titer} — ABNORMAL HIGH

## 2019-11-10 ENCOUNTER — Other Ambulatory Visit: Payer: Self-pay

## 2019-11-10 ENCOUNTER — Encounter: Payer: Self-pay | Admitting: Pulmonary Disease

## 2019-11-10 ENCOUNTER — Ambulatory Visit (INDEPENDENT_AMBULATORY_CARE_PROVIDER_SITE_OTHER): Payer: Medicare Other | Admitting: Pulmonary Disease

## 2019-11-10 VITALS — BP 126/62 | HR 95 | Temp 97.1°F | Ht 64.0 in | Wt 171.6 lb

## 2019-11-10 DIAGNOSIS — D7219 Other eosinophilia: Secondary | ICD-10-CM

## 2019-11-10 DIAGNOSIS — J849 Interstitial pulmonary disease, unspecified: Secondary | ICD-10-CM | POA: Diagnosis not present

## 2019-11-10 DIAGNOSIS — J40 Bronchitis, not specified as acute or chronic: Secondary | ICD-10-CM | POA: Diagnosis not present

## 2019-11-10 LAB — CBC WITH DIFFERENTIAL/PLATELET
Basophils Absolute: 0.1 10*3/uL (ref 0.0–0.1)
Basophils Relative: 1.1 % (ref 0.0–3.0)
Eosinophils Absolute: 0.4 10*3/uL (ref 0.0–0.7)
Eosinophils Relative: 4.3 % (ref 0.0–5.0)
HCT: 38.7 % (ref 36.0–46.0)
Hemoglobin: 13.3 g/dL (ref 12.0–15.0)
Lymphocytes Relative: 19.4 % (ref 12.0–46.0)
Lymphs Abs: 1.6 10*3/uL (ref 0.7–4.0)
MCHC: 34.4 g/dL (ref 30.0–36.0)
MCV: 91.1 fl (ref 78.0–100.0)
Monocytes Absolute: 0.6 10*3/uL (ref 0.1–1.0)
Monocytes Relative: 7.1 % (ref 3.0–12.0)
Neutro Abs: 5.7 10*3/uL (ref 1.4–7.7)
Neutrophils Relative %: 68.1 % (ref 43.0–77.0)
Platelets: 259 10*3/uL (ref 150.0–400.0)
RBC: 4.25 Mil/uL (ref 3.87–5.11)
RDW: 13.3 % (ref 11.5–15.5)
WBC: 8.4 10*3/uL (ref 4.0–10.5)

## 2019-11-10 MED ORDER — ALBUTEROL SULFATE HFA 108 (90 BASE) MCG/ACT IN AERS: 1.0000 | INHALATION_SPRAY | Freq: Four times a day (QID) | RESPIRATORY_TRACT | 3 refills | Status: DC | PRN

## 2019-11-10 MED ORDER — MOMETASONE FURO-FORMOTEROL FUM 200-5 MCG/ACT IN AERO: 2.0000 | INHALATION_SPRAY | Freq: Two times a day (BID) | RESPIRATORY_TRACT | 5 refills | Status: DC

## 2019-11-10 NOTE — Addendum Note (Signed)
Addended by: Suzzanne Cloud E on: 11/10/2019 10:03 AM   Modules accepted: Orders

## 2019-11-10 NOTE — Patient Instructions (Addendum)
Chronic Bronchitis Chronic cough - improved on inhaler --CONTINUE Dulera 200-5 mcg TWO puffs TWICE a day --CONTINUE Albuterol as needed --Consider holding lisinopril if cough worsens however she reports this is improved on bronchodilators. --Repeat CBC with diff today for eosinophilia  Mild Interstitial Lung Disease Follow-up with CT Chest in 6 months as ordered  Borderline mediastinal and right hilar lymphadenopathy - improved on CT Follow-up with CT Chest in 6 months   Follow-up with me in 6 months or NP [facility] as needed

## 2019-11-10 NOTE — Progress Notes (Signed)
Subjective:   PATIENT ID: Ann [name] GENDER: female DOB: 1946-01-04, MRN: [id number]   HPI  Chief Complaint  Patient presents with  . Follow-up    no symtpoms today 6 weeek follow up    Reason for Visit: Follow-up  [name] is a 74 year old female former smokerwith hypertension, HLD, ?hx lung nodule who presents with complaints of persistent cough.  Reviewed PCP records sent by Marilynne Drivers, [facility]: Referred to pulmonary for persistent cough and lung nodule. No clinic note. Patient's pulmonary medical history includes abnormal CT chest with documentation of left upper lobe lung nodule "rec repeat CT in 12/20." Patient was started on Dulera and low-dose prednisone (10 mg daily x 4 days) and tessalon perles and hydrocodone-homatropine cough syrup. She is also on lisinopril-HCTZ for her cough.  Since our last visit, she has had PFTs and CT Chest which suggest mild restrictive defect and persistent ground glass opacities bilaterally. Autoimmune work-up revealed +ANA 1:160 with myositis panel, sjogren's antibodies and scleroderma screen returning negative. She has been referred to Rheumatology for further evaluation. She has been compliant with [facility] which has improved her symptoms by 75%. She also started a daily generic Zyrtec daily and saline rinses. She reports her shortness of breath has improved cough resolved in general.  Social History: Former smoker. Quit 1976.  Past Medical History:  Diagnosis Date  . Bronchitis, chronic ([place])   . Hyperlipidemia   . Hypertension   . Pneumonia   . Pulmonary nodule   . Sepsis [facility])      Outpatient Medications Prior to Visit  Medication Sig Dispense Refill  . atorvastatin (LIPITOR) 20 MG tablet Take 20 mg by mouth daily.    . cetirizine (ZYRTEC) 10 MG tablet Take 10 mg by mouth daily.    . Cholecalciferol (VITAMIN D3) 50 MCG (2000 UT) TABS Take 1 tablet by mouth daily.    Marland Kitchen estradiol (ESTRACE)  0.1 MG/GM vaginal cream Place 0.5 g vaginally 2 (two) times a week.    Marland Kitchen ibuprofen (ADVIL) 200 MG tablet Take 400 mg by mouth every 6 (six) hours as needed for moderate pain.    Marland Kitchen lisinopril-hydrochlorothiazide (ZESTORETIC) 20-25 MG tablet Take 1 tablet by mouth daily.    Marland Kitchen albuterol (VENTOLIN HFA) 108 (90 Base) MCG/ACT inhaler Inhale 1-2 puffs into the lungs every 6 (six) hours as needed for wheezing or shortness of breath. 1 Inhaler 1  . mometasone-formoterol (DULERA) 200-5 MCG/ACT AERO Inhale 2 puffs into the lungs 2 (two) times daily. 13 g 5   No facility-administered medications prior to visit.    Review of Systems  Constitutional: Negative for chills, diaphoresis, fever, malaise/fatigue and weight loss.  HENT: Negative for congestion.   Respiratory: Positive for shortness of breath. Negative for cough, hemoptysis, sputum production and wheezing.   Cardiovascular: Negative for chest pain, palpitations and leg swelling.    Objective:   Vitals:   11/10/19 0915  BP: 126/62  Pulse: 95  Temp: (!) 97.1 F (36.2 C)  TempSrc: Temporal  SpO2: 96%  Weight: 171 lb 9.6 oz (77.8 kg)  Height: 5\' 4"  (1.626 m)   SpO2: 96 % O2 Device: None (Room air)  Physical Exam: General: Well-appearing, no acute distress HENT: [place], AT Eyes: EOMI, no scleral icterus Respiratory: Clear to auscultation bilaterally.  No crackles, wheezing or rales Cardiovascular: RRR, -M/R/G, no JVD GI: BS+, soft, nontender Neuro: AAO x4, CNII-XII grossly intact Skin: Intact, no rashes or bruising Psych:  Normal mood, normal affect  Data Reviewed:  Imaging: CTA 03/26/19 - No PE, borderline mediastinal (57mm) and right hilar (47mm) lymph nodes, stable compared to 02/16/19 CT Chest 09/01/19 - Minimal bibasilar interstitial thickening/scarring at the bases with minimal bronchiectasis R>L.  Mild bilateral GGO with improvement in LUL nodule/GGO  PFT: 09/22/19 FVC 2.46 (85%) FEV1 1.95 (89%) Ratio 76  TLC 76% DLCO  74% Interpretation: Mild restrictive defect with mildly reduced uncorrected DLCO  Labs: CBC 03/26/19 - 10.1 Abs eos 1500  Imaging, labs and test noted above have been reviewed independently by me.    Assessment & Plan:  74 year old female with chronic bronchitis, mild bilateral GGO and +ANA who presents for follow-up. We discussed clinical course of ILD including progression and management. She seems well-controlled on ICS/LABA at this time. Can consider trial off ACEI at a later time if her cough worsens. Of note, she has peripheral eosinophilia that can be contributing to her respiratory symptoms.  Discussion: Chronic Bronchitis Chronic cough - improved on inhaler --CONTINUE Dulera 200-5 mcg TWO puffs TWICE a day --CONTINUE Albuterol as needed --Consider holding lisinopril if cough worsens however she reports this is improved on bronchodilators. --CONTINUE daily antihistamine --Repeat CBC with diff today for eosinophilia  Mild Interstitial Lung Disease Follow-up with CT Chest in 6 months as ordered  Borderline mediastinal and right hilar lymphadenopathy - improved on CT Follow-up with CT Chest in 6 months  Health Maintenance Immunization History  Administered Date(s) Administered  . Influenza, High Dose Seasonal PF 06/18/2018, 05/28/2019  . Influenza-Unspecified 09/05/2015, 07/05/2017  . PFIZER SARS-COV-2 Vaccination 10/11/2019, 11/01/2019  . Pneumococcal Conjugate-13 07/04/2016   Orders Placed This Encounter  Procedures  . CBC with Differential/Platelet    Standing Status:   Future    Standing Expiration Date:   11/09/2020   Meds ordered this encounter  Medications  . albuterol (VENTOLIN HFA) 108 (90 Base) MCG/ACT inhaler    Sig: Inhale 1-2 puffs into the lungs every 6 (six) hours as needed for wheezing or shortness of breath.    Dispense:  8 g    Refill:  3  . mometasone-formoterol (DULERA) 200-5 MCG/ACT AERO    Sig: Inhale 2 puffs into the lungs 2 (two) times daily.     Dispense:  13 g    Refill:  5    Return in about 6 months (around 05/12/2020).  I have spent a total time of 31-minutes on the day of the appointment reviewing prior documentation, coordinating care and discussing medical diagnosis and plan with the patient/family. Imaging, labs and tests included in this note have been reviewed and interpreted independently by me.   [place], MD [place] [facility] 11/10/2019 9:48 AM  Office Number [phone]

## 2019-11-20 DIAGNOSIS — R768 Other specified abnormal immunological findings in serum: Secondary | ICD-10-CM | POA: Diagnosis not present

## 2019-11-20 DIAGNOSIS — M79603 Pain in arm, unspecified: Secondary | ICD-10-CM | POA: Diagnosis not present

## 2019-11-20 DIAGNOSIS — R5383 Other fatigue: Secondary | ICD-10-CM | POA: Diagnosis not present

## 2019-11-20 DIAGNOSIS — J42 Unspecified chronic bronchitis: Secondary | ICD-10-CM | POA: Diagnosis not present

## 2019-11-20 DIAGNOSIS — L539 Erythematous condition, unspecified: Secondary | ICD-10-CM | POA: Diagnosis not present

## 2019-11-20 DIAGNOSIS — R682 Dry mouth, unspecified: Secondary | ICD-10-CM | POA: Diagnosis not present

## 2019-11-20 DIAGNOSIS — J849 Interstitial pulmonary disease, unspecified: Secondary | ICD-10-CM | POA: Diagnosis not present

## 2019-11-20 DIAGNOSIS — M359 Systemic involvement of connective tissue, unspecified: Secondary | ICD-10-CM | POA: Diagnosis not present

## 2019-11-20 DIAGNOSIS — R29898 Other symptoms and signs involving the musculoskeletal system: Secondary | ICD-10-CM | POA: Diagnosis not present

## 2019-11-28 DIAGNOSIS — R768 Other specified abnormal immunological findings in serum: Secondary | ICD-10-CM | POA: Diagnosis not present

## 2019-11-28 DIAGNOSIS — E7800 Pure hypercholesterolemia, unspecified: Secondary | ICD-10-CM | POA: Diagnosis not present

## 2019-11-28 DIAGNOSIS — R911 Solitary pulmonary nodule: Secondary | ICD-10-CM | POA: Diagnosis not present

## 2019-11-28 DIAGNOSIS — M8580 Other specified disorders of bone density and structure, unspecified site: Secondary | ICD-10-CM | POA: Diagnosis not present

## 2019-11-28 DIAGNOSIS — N952 Postmenopausal atrophic vaginitis: Secondary | ICD-10-CM | POA: Diagnosis not present

## 2019-11-28 DIAGNOSIS — R5383 Other fatigue: Secondary | ICD-10-CM | POA: Diagnosis not present

## 2019-11-28 DIAGNOSIS — Z1389 Encounter for screening for other disorder: Secondary | ICD-10-CM | POA: Diagnosis not present

## 2019-11-28 DIAGNOSIS — Z0000 Encounter for general adult medical examination without abnormal findings: Secondary | ICD-10-CM | POA: Diagnosis not present

## 2019-11-28 DIAGNOSIS — E559 Vitamin D deficiency, unspecified: Secondary | ICD-10-CM | POA: Diagnosis not present

## 2019-11-28 DIAGNOSIS — I10 Essential (primary) hypertension: Secondary | ICD-10-CM | POA: Diagnosis not present

## 2019-11-28 DIAGNOSIS — J42 Unspecified chronic bronchitis: Secondary | ICD-10-CM | POA: Diagnosis not present

## 2019-12-09 ENCOUNTER — Telehealth: Payer: Self-pay | Admitting: Pulmonary Disease

## 2019-12-09 DIAGNOSIS — Z23 Encounter for immunization: Secondary | ICD-10-CM | POA: Diagnosis not present

## 2019-12-09 NOTE — Telephone Encounter (Signed)
Attempted to call [facility] to get a direct number for Dr. Kathlene November but he was not available at the time of call. Was advised to call the main number at [phone] and press option 1 when calling back.

## 2019-12-10 NOTE — Telephone Encounter (Signed)
[place] Telephone Encounter  Discussed shared patient with Dr. Kathlene November from Rheumatology. Autoimmune work-up negative except for positive ANA. No treatment recommended from a Rheumatology standpoint. I will continue to manage patient's mild restrictive defect with no changes to her Pulmonary meds.  Rodman Pickle, M.D. [facility]/Critical [facility] 12/10/2019 10:36 AM

## 2020-01-27 DIAGNOSIS — Z780 Asymptomatic menopausal state: Secondary | ICD-10-CM | POA: Diagnosis not present

## 2020-01-27 DIAGNOSIS — M85852 Other specified disorders of bone density and structure, left thigh: Secondary | ICD-10-CM | POA: Diagnosis not present

## 2020-01-27 DIAGNOSIS — Z1231 Encounter for screening mammogram for malignant neoplasm of breast: Secondary | ICD-10-CM | POA: Diagnosis not present

## 2020-02-13 ENCOUNTER — Ambulatory Visit
Admission: RE | Admit: 2020-02-13 | Discharge: 2020-02-13 | Disposition: A | Discharge status: Home / Self Care | Payer: Medicare Other | Source: Ambulatory Visit | Attending: Pulmonary Disease | Admitting: Pulmonary Disease

## 2020-02-13 DIAGNOSIS — J849 Interstitial pulmonary disease, unspecified: Secondary | ICD-10-CM

## 2020-02-13 DIAGNOSIS — R911 Solitary pulmonary nodule: Secondary | ICD-10-CM | POA: Diagnosis not present

## 2020-02-13 DIAGNOSIS — R918 Other nonspecific abnormal finding of lung field: Secondary | ICD-10-CM

## 2020-02-13 IMAGING — CT CT CHEST HIGH RESOLUTION W/O CM
2 of 8 series · 13 of 36 positions shown, 16 images · non-contrast
Comparison: Most recent chest CT dated [DATE].

CLINICAL DATA: Follow-up pulmonary nodule and interstitial lung
disease.

EXAM:
CT CHEST WITHOUT CONTRAST
TECHNIQUE: Multidetector CT imaging of the chest was performed following the
standard protocol without intravenous contrast. High resolution
imaging of the lungs, as well as inspiratory and expiratory imaging,
was performed.

[Series 5: chest 2.00 br36 s3 cor soft · coronal · 0.56mm/px · 3 of 150 slices shown]
[im 30/150  lung]
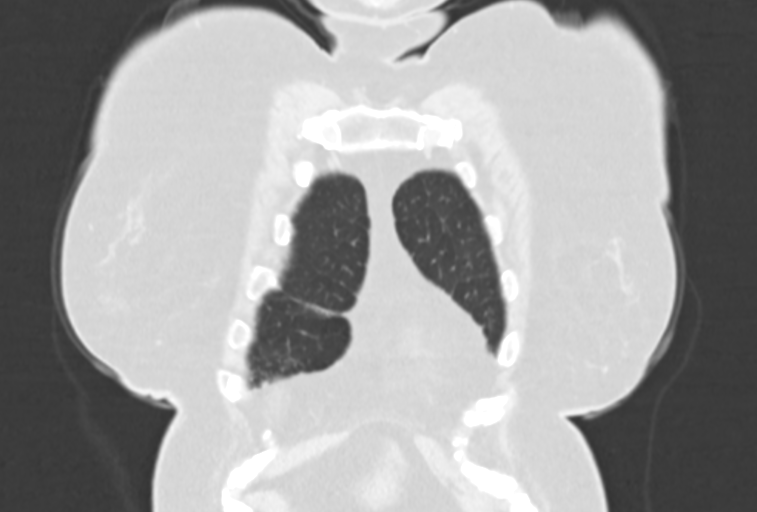
[im 60/150  lung]
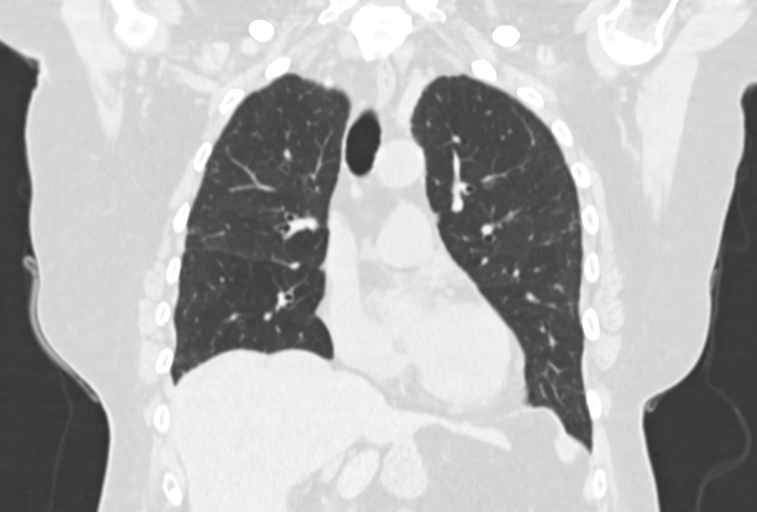
[im 90/150  lung]
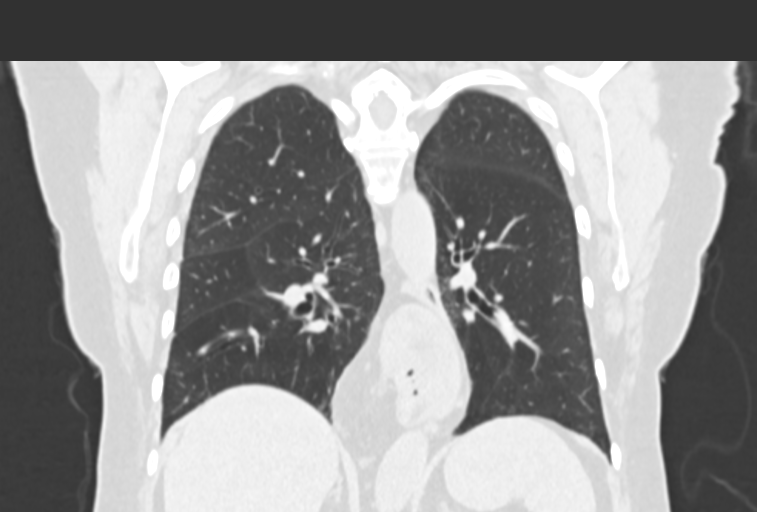

[Series 12: chest 1.00 br60 s3 high res thins 1x1 mm · axial · 0.83mm/px · z∈[+1351,+1592]mm · 10 of 285 slices shown, 13 images]
[im 22/285  mediastinal]
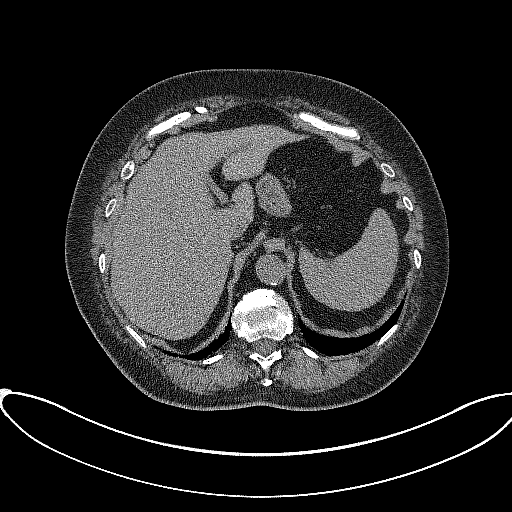
[im 22/285  lung]
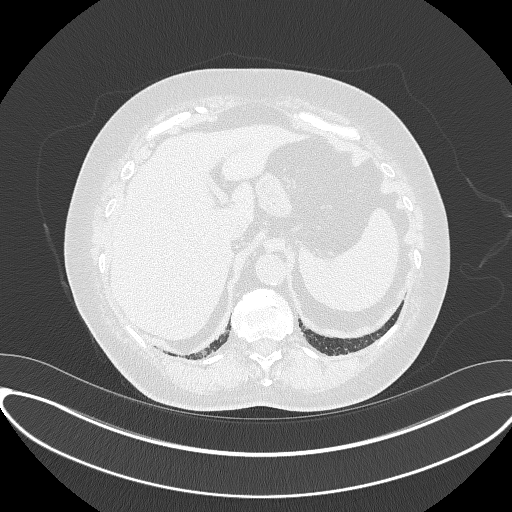
[im 44/285  lung]
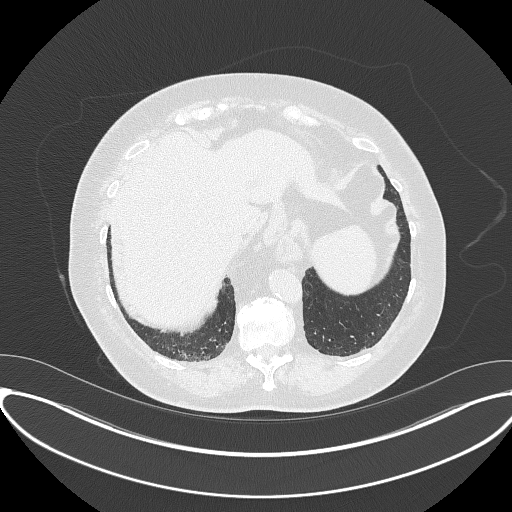
[im 88/285  lung]
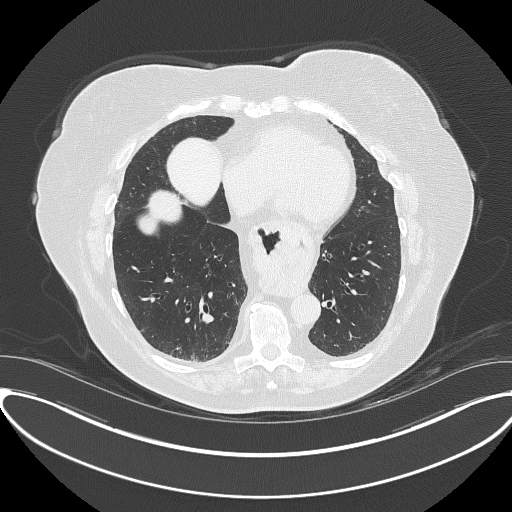
[im 110/285  lung]
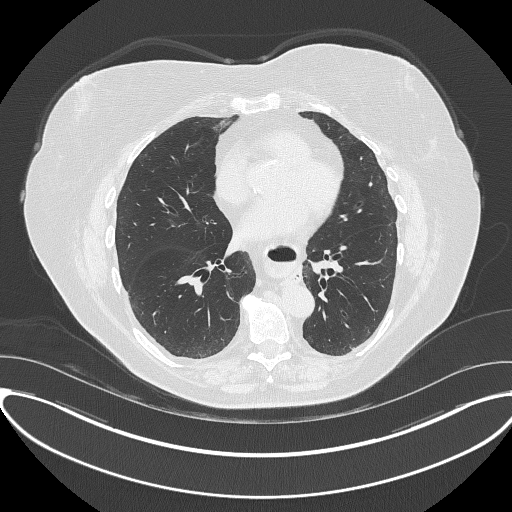
[im 132/285  mediastinal]
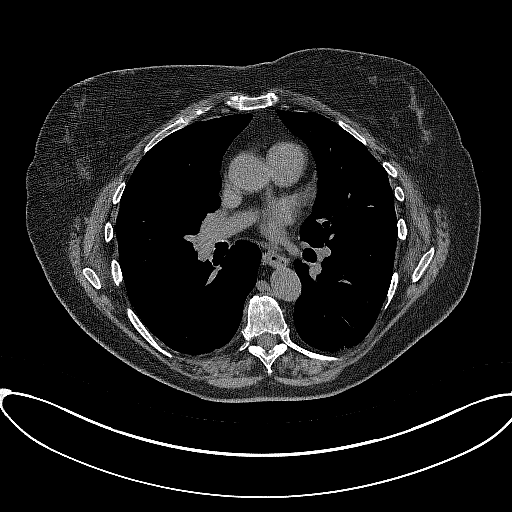
[im 132/285  lung]
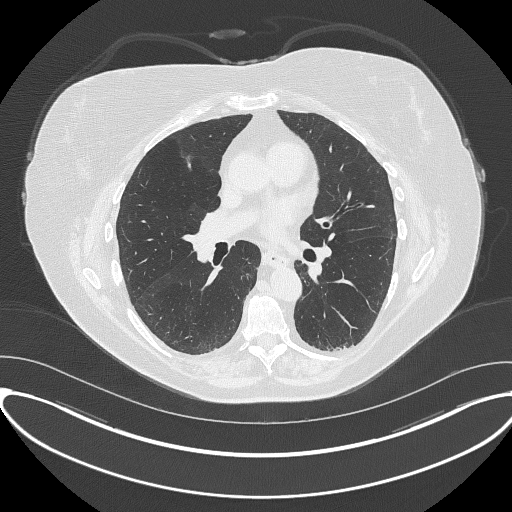
[im 153/285  lung]
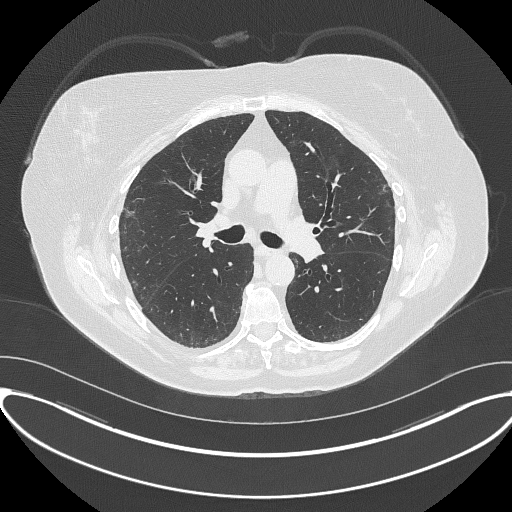
[im 175/285  lung]
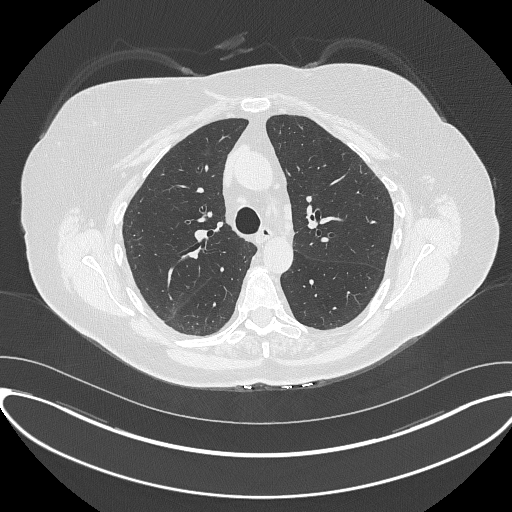
[im 219/285  lung]
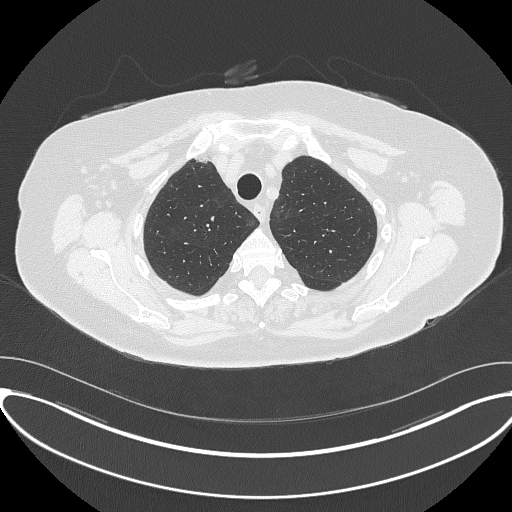
[im 241/285  mediastinal]
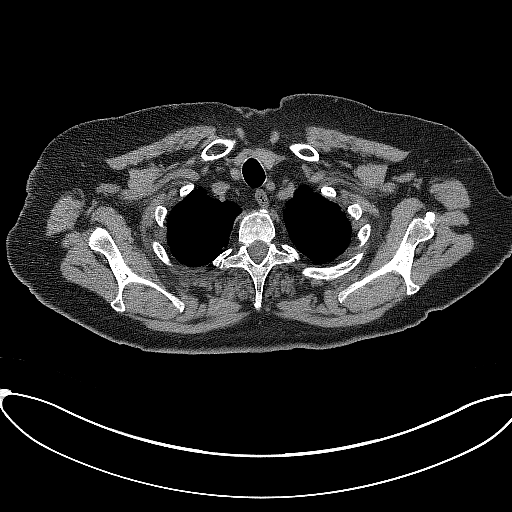
[im 241/285  lung]
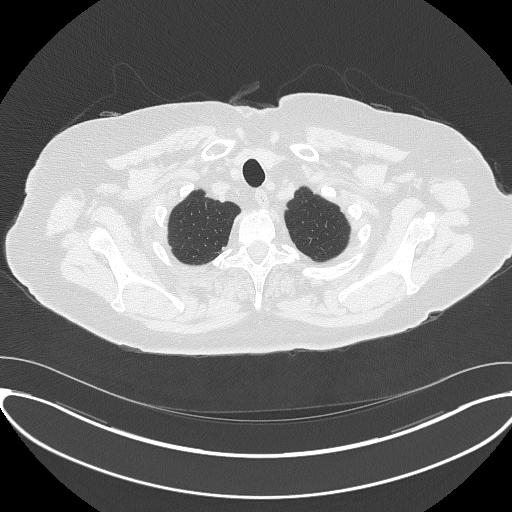
[im 263/285  lung]
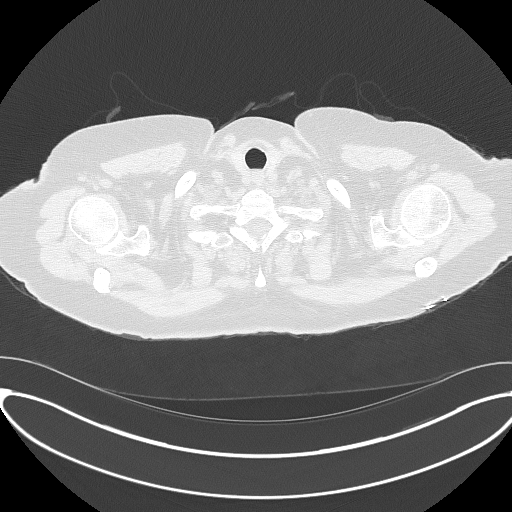

[13 of 36 positions shown; findings below may reference images not displayed]

FINDINGS: Cardiovascular: Normal heart size. No significant pericardial
effusion/thickening. Atherosclerotic nonaneurysmal thoracic aorta.
Normal caliber pulmonary arteries.

Mediastinum/Nodes: No discrete thyroid nodules. Unremarkable
esophagus. No pathologically enlarged axillary, mediastinal or hilar
lymph nodes, noting limited sensitivity for the detection of hilar
adenopathy on this noncontrast study.

Lungs/Pleura: No pneumothorax. No pleural effusion. No acute
consolidative airspace disease or lung masses. Previously described
subsolid 0.6 cm left upper lobe pulmonary nodule on [DATE] chest
CT is now a pure ground-glass 0.6 cm nodule (series 9/image 51),
stable in size. No new significant pulmonary nodules. No significant
air trapping or evidence of tracheobronchomalacia on the expiration
sequence. There is mild patchy subpleural reticulation and
ground-glass opacity in both lungs with associated mild traction
bronchiolectasis, which persists on prone sequence. Basilar
predominance to these findings. No frank honeycombing. The
ground-glass opacities have decreased since the baseline [DATE]
chest CT study, with no appreciable interval change since [DATE]
chest CT.

Upper abdomen: Stable large hiatal hernia. Subcentimeter hypodense
lateral segment left liver lobe lesion is too small to characterize
and not definitely changed.

Musculoskeletal: No aggressive appearing focal osseous lesions.
Marked thoracic spondylosis.
IMPRESSION: 1. Spectrum of findings suggestive of a basilar predominant fibrotic
interstitial lung disease without frank honeycombing. Ground-glass
opacities have improved since the baseline [DATE] chest CT. No
significant interval progression since [DATE] chest CT. Fibrotic
phase nonspecific interstitial pneumonia (NSIP) favored, with
differential including UIP. Findings are categorized as probable UIP
per consensus guidelines: Diagnosis of Idiopathic Pulmonary
Fibrosis: An Official ATS/ERS/JRS/ALAT Clinical Practice Guideline.
Am J Respir Crit Care Med Vol 198, YASOURA 5, [JI]-e[DATE].
2. Previously described 0.6 cm left upper lobe pulmonary nodule on
[DATE] chest CT is now a pure ground-glass 0.6 cm nodule, stable
in size. Follow-up chest CT is recommended every 2 years until 5
years of stability has been established. This recommendation follows
the consensus statement: Guidelines for Management of Incidental
Pulmonary Nodules Detected on CT Images: From the [HOSPITAL]
3. Stable large hiatal hernia.
4. Aortic Atherosclerosis ([JI]-[JI]).

## 2020-02-13 MED ORDER — IOPAMIDOL (ISOVUE-300) INJECTION 61%: 100.0000 mL | Freq: Once | INTRAVENOUS | Status: DC | PRN

## 2020-04-13 DIAGNOSIS — J849 Interstitial pulmonary disease, unspecified: Secondary | ICD-10-CM | POA: Diagnosis not present

## 2020-04-13 DIAGNOSIS — R768 Other specified abnormal immunological findings in serum: Secondary | ICD-10-CM | POA: Diagnosis not present

## 2020-04-13 DIAGNOSIS — R682 Dry mouth, unspecified: Secondary | ICD-10-CM | POA: Diagnosis not present

## 2020-04-13 DIAGNOSIS — J42 Unspecified chronic bronchitis: Secondary | ICD-10-CM | POA: Diagnosis not present

## 2020-04-13 DIAGNOSIS — R5383 Other fatigue: Secondary | ICD-10-CM | POA: Diagnosis not present

## 2020-04-13 DIAGNOSIS — E119 Type 2 diabetes mellitus without complications: Secondary | ICD-10-CM | POA: Diagnosis not present

## 2020-04-13 DIAGNOSIS — M791 Myalgia, unspecified site: Secondary | ICD-10-CM | POA: Diagnosis not present

## 2020-05-03 DIAGNOSIS — L570 Actinic keratosis: Secondary | ICD-10-CM | POA: Diagnosis not present

## 2020-05-03 DIAGNOSIS — L814 Other melanin hyperpigmentation: Secondary | ICD-10-CM | POA: Diagnosis not present

## 2020-05-03 DIAGNOSIS — L821 Other seborrheic keratosis: Secondary | ICD-10-CM | POA: Diagnosis not present

## 2020-05-03 DIAGNOSIS — L820 Inflamed seborrheic keratosis: Secondary | ICD-10-CM | POA: Diagnosis not present

## 2020-05-03 DIAGNOSIS — D1801 Hemangioma of skin and subcutaneous tissue: Secondary | ICD-10-CM | POA: Diagnosis not present

## 2020-06-01 DIAGNOSIS — M791 Myalgia, unspecified site: Secondary | ICD-10-CM | POA: Diagnosis not present

## 2020-06-01 DIAGNOSIS — E7800 Pure hypercholesterolemia, unspecified: Secondary | ICD-10-CM | POA: Diagnosis not present

## 2020-06-01 DIAGNOSIS — R768 Other specified abnormal immunological findings in serum: Secondary | ICD-10-CM | POA: Diagnosis not present

## 2020-06-01 DIAGNOSIS — J42 Unspecified chronic bronchitis: Secondary | ICD-10-CM | POA: Diagnosis not present

## 2020-06-01 DIAGNOSIS — I10 Essential (primary) hypertension: Secondary | ICD-10-CM | POA: Diagnosis not present

## 2020-06-01 DIAGNOSIS — M8580 Other specified disorders of bone density and structure, unspecified site: Secondary | ICD-10-CM | POA: Diagnosis not present

## 2020-06-01 DIAGNOSIS — N952 Postmenopausal atrophic vaginitis: Secondary | ICD-10-CM | POA: Diagnosis not present

## 2020-06-01 DIAGNOSIS — R911 Solitary pulmonary nodule: Secondary | ICD-10-CM | POA: Diagnosis not present

## 2020-06-01 DIAGNOSIS — R350 Frequency of micturition: Secondary | ICD-10-CM | POA: Diagnosis not present

## 2020-06-04 ENCOUNTER — Ambulatory Visit (INDEPENDENT_AMBULATORY_CARE_PROVIDER_SITE_OTHER): Payer: Medicare Other | Admitting: Pulmonary Disease

## 2020-06-04 ENCOUNTER — Other Ambulatory Visit: Payer: Self-pay

## 2020-06-04 ENCOUNTER — Encounter: Payer: Self-pay | Admitting: Pulmonary Disease

## 2020-06-04 VITALS — BP 122/68 | HR 70 | Temp 98.6°F | Ht 64.0 in | Wt 173.0 lb

## 2020-06-04 DIAGNOSIS — J42 Unspecified chronic bronchitis: Secondary | ICD-10-CM

## 2020-06-04 DIAGNOSIS — J849 Interstitial pulmonary disease, unspecified: Secondary | ICD-10-CM

## 2020-06-04 NOTE — Patient Instructions (Signed)
Chronic Bronchitis Chronic cough - improved on inhaler --CONTINUE Dulera 200-5 mcg TWO puffs TWICE a day --CONTINUE zyrtec  Mild Interstitial Lung Disease Reviewed CT which demonstrated mild ILD. PFTs with mild restrictive process --Obtain PFT in 09/2020

## 2020-06-04 NOTE — Progress Notes (Signed)
Subjective:   PATIENT ID: Ann [name] GENDER: female DOB: 09/12/1945, MRN: [id number]   HPI  Chief Complaint  Patient presents with  . Follow-up    started meloxicam recently due to arm and leg pain.  breathing has been good recently.  feels that she is not able to walk like she used to be able to due to the stamina    Reason for Visit: Follow-up  [name] is a 74 year old female former smoker who presents for follow-up of ILD  Synopsis: Initially referred for chronic cough and diagnosed with mild ILD. Autoimmune work-up revealed +ANA 1:160 with myositis panel, sjogren's antibodies and scleroderma screen returning negative. Rheumatology with no further work-up indicated.  Interval She overall reports her breathing is improved. She is compliant Dulera and tolerating well. She is also taking zyrtec. Her cough has nearly resolved with 90% improvement. When she does have breakthrough cough, it is dry and intermittently. Denies coughing spasms. No recent illness requiring antibiotics or steroids. Denies chest pain, productive cough, wheezing or lower extremity edema.  Social History: Former smoker. Quit 1976.  Past Medical History:  Diagnosis Date  . Bronchitis, chronic ([place])   . Hyperlipidemia   . Hypertension   . Pneumonia   . Pulmonary nodule   . Sepsis [facility])      Outpatient Medications Prior to Visit  Medication Sig Dispense Refill  . albuterol (VENTOLIN HFA) 108 (90 Base) MCG/ACT inhaler Inhale 1-2 puffs into the lungs every 6 (six) hours as needed for wheezing or shortness of breath. 8 g 3  . atorvastatin (LIPITOR) 20 MG tablet Take 20 mg by mouth daily.    . cetirizine (ZYRTEC) 10 MG tablet Take 10 mg by mouth daily.    . Cholecalciferol (VITAMIN D3) 50 MCG (2000 UT) TABS Take 1 tablet by mouth daily.    Marland Kitchen estradiol (ESTRACE) 0.1 MG/GM vaginal cream Place 0.5 g vaginally 2 (two) times a week.    Marland Kitchen ibuprofen (ADVIL) 200 MG tablet Take 400 mg by mouth  every 6 (six) hours as needed for moderate pain.    Marland Kitchen lisinopril-hydrochlorothiazide (ZESTORETIC) 20-25 MG tablet Take 1 tablet by mouth daily.    . meloxicam (MOBIC) 15 MG tablet Take 15 mg by mouth daily.    . mometasone-formoterol (DULERA) 200-5 MCG/ACT AERO Inhale 2 puffs into the lungs 2 (two) times daily. 13 g 5   No facility-administered medications prior to visit.    Review of Systems  Constitutional: Negative for chills, diaphoresis, fever, malaise/fatigue and weight loss.  HENT: Negative for congestion.   Respiratory: Positive for cough. Negative for hemoptysis, sputum production, shortness of breath and wheezing.   Cardiovascular: Negative for chest pain, palpitations and leg swelling.    Objective:   Vitals:   06/04/20 0958  BP: 122/68  Pulse: 70  Temp: 98.6 F (37 C)  TempSrc: Oral  SpO2: 96%  Weight: 173 lb (78.5 kg)  Height: 5\' 4"  (1.626 m)   SpO2: 96 %  Physical Exam: General: Well-appearing, no acute distress HENT: [place], AT Eyes: EOMI, no scleral icterus Respiratory: Clear to auscultation bilaterally.  No crackles, wheezing or rales Cardiovascular: RRR, -M/R/G, no JVD Extremities:-Edema,-tenderness Neuro: AAO x4, CNII-XII grossly intact Skin: Intact, no rashes or bruising Psych: Normal mood, normal affect  Data Reviewed:  Imaging: CTA 03/26/19 - No PE, borderline mediastinal (58mm) and right hilar (1mm) lymph nodes, stable compared to 02/16/19 CT Chest 09/01/19 - Minimal bibasilar interstitial thickening/scarring at the bases  with minimal bronchiectasis R>L.  Mild bilateral GGO with improvement in LUL nodule/GGO HRCT Chest 02/13/20 - Mild subpleural GGO with mild traction bronchiectasis, basilar predominance. No honeycombing. Stable lung nodules  PFT: 09/22/19 FVC 2.46 (85%) FEV1 1.95 (89%) Ratio 76  TLC 76% DLCO 74% Interpretation: Mild restrictive defect with mildly reduced uncorrected DLCO  Labs: CBC 03/26/19 - 10.1 Abs eos 1500  Imaging, labs and  test noted above have been reviewed independently by me.   Assessment & Plan:  Discussion: 74 year old female with mild ILD. Auto-immune work-up demonstrated +ANA however no further work-up per Rheumatology. Symptoms well-controlled on ICS/LABA. Chest imaging reviewed which demonstrated no interval progression of ILD. Discussed clinical course of ILD and management.  Mild Interstitial Lung Disease Reviewed CT which demonstrated mild ILD. PFTs with mild restrictive process. No indication for bronchoscopy as symptoms are stable and disease without progression on chest imaging. --Obtain PFT in 09/2020  Chronic Bronchitis - controlled Chronic cough - controlled --CONTINUE Dulera 200-5 mcg TWO puffs TWICE a day --CONTINUE zyrtec   Health Maintenance Immunization History  Administered Date(s) Administered  . Influenza, High Dose Seasonal PF 06/18/2018, 05/28/2019  . Influenza-Unspecified 09/05/2015, 07/05/2017  . PFIZER SARS-COV-2 Vaccination 10/11/2019, 11/01/2019  . Pneumococcal Conjugate-13 07/04/2016   Orders Placed This Encounter  Procedures  . Pulmonary function test    Standing Status:   Future    Standing Expiration Date:   06/04/2021    Order Specific Question:   Where should this test be performed?    Answer:   [facility]    Order Specific Question:   Full PFT: includes the following: basic spirometry, spirometry pre & post bronchodilator, diffusion capacity (DLCO), lung volumes    Answer:   Full PFT    Order Specific Question:   MIP/MEP    Answer:   Yes    Order Specific Question:   6 minute walk    Answer:   No    Order Specific Question:   ABG    Answer:   No    Order Specific Question:   Diffusion capacity (DLCO)    Answer:   Yes    Order Specific Question:   Lung volumes    Answer:   Yes    Order Specific Question:   Methacholine challenge    Answer:   No   No orders of the defined types were placed in this encounter.   Return in about 3 months (around  09/04/2020).  I have spent a total time of 32-minutes on the day of the appointment reviewing prior documentation, coordinating care and discussing medical diagnosis and plan with the patient/family. Imaging, labs and tests included in this note have been reviewed and interpreted independently by me.  [place], MD [facility] 06/04/2020 10:43 AM  Office Number [phone]

## 2020-06-07 DIAGNOSIS — H18523 Epithelial (juvenile) corneal dystrophy, bilateral: Secondary | ICD-10-CM | POA: Diagnosis not present

## 2020-06-07 DIAGNOSIS — H26493 Other secondary cataract, bilateral: Secondary | ICD-10-CM | POA: Diagnosis not present

## 2020-06-19 ENCOUNTER — Ambulatory Visit: Payer: Medicare Other | Attending: Internal Medicine

## 2020-06-19 ENCOUNTER — Other Ambulatory Visit: Payer: Self-pay

## 2020-06-19 DIAGNOSIS — Z23 Encounter for immunization: Secondary | ICD-10-CM

## 2020-06-19 NOTE — Progress Notes (Signed)
   Covid-19 Vaccination Clinic  Name:  Ann [name]    MRN: [id number] DOB: [birth year]  06/19/2020  Ann [name] was observed post Covid-19 immunization for 15 minutes without incident. She was provided with Vaccine Information Sheet and instruction to access the V-Safe system.   Ann [name] was instructed to call 911 with any severe reactions post vaccine: Marland Kitchen Difficulty breathing  . Swelling of face and throat  . A fast heartbeat  . A bad rash all over body  . Dizziness and weakness

## 2020-08-18 DIAGNOSIS — L259 Unspecified contact dermatitis, unspecified cause: Secondary | ICD-10-CM | POA: Diagnosis not present

## 2020-08-30 DIAGNOSIS — R1032 Left lower quadrant pain: Secondary | ICD-10-CM | POA: Diagnosis not present

## 2020-09-06 ENCOUNTER — Ambulatory Visit (INDEPENDENT_AMBULATORY_CARE_PROVIDER_SITE_OTHER): Payer: Medicare Other | Admitting: Pulmonary Disease

## 2020-09-06 ENCOUNTER — Other Ambulatory Visit: Payer: Self-pay

## 2020-09-06 DIAGNOSIS — J42 Unspecified chronic bronchitis: Secondary | ICD-10-CM

## 2020-09-06 DIAGNOSIS — J849 Interstitial pulmonary disease, unspecified: Secondary | ICD-10-CM

## 2020-09-06 NOTE — Progress Notes (Signed)
PFT completed today.  

## 2020-09-14 ENCOUNTER — Ambulatory Visit: Payer: Medicare Other | Admitting: Pulmonary Disease

## 2020-09-17 ENCOUNTER — Telehealth: Payer: Self-pay | Admitting: Pulmonary Disease

## 2020-09-17 NOTE — Telephone Encounter (Signed)
Called and spoke with pt who stated she was told by pharmacist at her pharmacy that manufacturer for [facility] is no longer going to be making that medication. Pt stated that her insurance will cover Breo and wanted to know if we could switch her to that instead.  Dr. Loanne Drilling, please advise.

## 2020-09-17 NOTE — Telephone Encounter (Signed)
lmtcb for pt.  

## 2020-09-21 MED ORDER — BREO ELLIPTA 200-25 MCG/INH IN AEPB: 1.0000 | INHALATION_SPRAY | Freq: Every day | RESPIRATORY_TRACT | 5 refills | Status: DC

## 2020-09-21 NOTE — Telephone Encounter (Signed)
Ok to switch to [employer] 200-25 mcg ONE puff ONCE a day. This is a different schedule than Dulera. Advise patient that she can have inhaler teaching at the pharmacy if they offer it or at our office if staffing available.  Rodman Pickle, M.D. [facility] Pulmonary/Critical Care Medicine 09/21/2020 10:43 AM

## 2020-09-21 NOTE — Telephone Encounter (Signed)
Spoke with pt, aware of recs.  rx sent to preferred pharmacy.  Med list updated to reflect rx change.  Nothing further needed at this time- will close encounter.

## 2020-09-27 ENCOUNTER — Other Ambulatory Visit: Payer: Self-pay

## 2020-09-27 ENCOUNTER — Encounter: Payer: Self-pay | Admitting: Pulmonary Disease

## 2020-09-27 ENCOUNTER — Ambulatory Visit (INDEPENDENT_AMBULATORY_CARE_PROVIDER_SITE_OTHER): Payer: Medicare Other | Admitting: Pulmonary Disease

## 2020-09-27 VITALS — BP 124/64 | HR 89 | Temp 97.1°F | Ht 65.0 in | Wt 173.1 lb

## 2020-09-27 DIAGNOSIS — J849 Interstitial pulmonary disease, unspecified: Secondary | ICD-10-CM | POA: Diagnosis not present

## 2020-09-27 LAB — PULMONARY FUNCTION TEST
DL/VA % pred: 88 %
DL/VA: 3.62 ml/min/mmHg/L
DLCO cor % pred: 70 %
DLCO cor: 13.63 ml/min/mmHg
DLCO unc % pred: 70 %
DLCO unc: 13.63 ml/min/mmHg
FEF 25-75 Post: 1.63 L/sec
FEF 25-75 Pre: 1.54 L/sec
FEF2575-%Change-Post: 5 %
FEF2575-%Pred-Post: 97 %
FEF2575-%Pred-Pre: 92 %
FEV1-%Change-Post: 2 %
FEV1-%Pred-Post: 88 %
FEV1-%Pred-Pre: 86 %
FEV1-Post: 1.88 L
FEV1-Pre: 1.84 L
FEV1FVC-%Change-Post: 5 %
FEV1FVC-%Pred-Pre: 102 %
FEV6-%Change-Post: -2 %
FEV6-%Pred-Post: 85 %
FEV6-%Pred-Pre: 88 %
FEV6-Post: 2.32 L
FEV6-Pre: 2.38 L
FEV6FVC-%Pred-Post: 105 %
FEV6FVC-%Pred-Pre: 105 %
FVC-%Change-Post: -3 %
FVC-%Pred-Post: 81 %
FVC-%Pred-Pre: 84 %
FVC-Post: 2.32 L
FVC-Pre: 2.4 L
Post FEV1/FVC ratio: 81 %
Post FEV6/FVC ratio: 100 %
Pre FEV1/FVC ratio: 77 %
Pre FEV6/FVC Ratio: 100 %
RV % pred: 48 %
RV: 1.12 L
TLC % pred: 98 %
TLC: 5.03 L

## 2020-09-27 NOTE — Patient Instructions (Signed)
Mild Interstitial Lung Disease Reviewed PFTs. No significant change  Chronic Bronchitis - controlled Chronic cough - controlled --Changed from Dulera to Breo 200-25 mcg ONE puff ONCE a day --CONTINUE zyrtec  Follow-up in 6 months

## 2020-09-27 NOTE — Progress Notes (Signed)
Subjective:   PATIENT ID: Ann [name] GENDER: female DOB: February 03, 1946, MRN: [id number]   HPI  Chief Complaint  Patient presents with  . Follow-up    PFT done earlier this month.  Wanted to review this.  Pt started on Breo today    Reason for Visit: Follow-up  [name] is a 75 year old female former smoker with ILD who presents for follow-up with PFTs.  Synopsis: Initially referred for chronic cough and diagnosed with mild ILD. Autoimmune work-up revealed +ANA 1:160 with myositis panel, sjogren's antibodies and scleroderma screen returning negative. Rheumatology with no further work-up indicated.  Interval  She overall reports her breathing is improved. She is compliant Dulera and tolerating well. She is also taking zyrtec. Her cough has nearly resolved with 90% improvement. When she does have breakthrough cough, it is dry and intermittently. Denies coughing spasms. No recent illness requiring antibiotics or steroids. Denies chest pain, productive cough, wheezing or lower extremity edema.  Social History: Former smoker. Quit 1976.  Past Medical History:  Diagnosis Date  . Bronchitis, chronic ([place])   . Hyperlipidemia   . Hypertension   . Pneumonia   . Pulmonary nodule   . Sepsis [facility])      Outpatient Medications Prior to Visit  Medication Sig Dispense Refill  . albuterol (VENTOLIN HFA) 108 (90 Base) MCG/ACT inhaler Inhale 1-2 puffs into the lungs every 6 (six) hours as needed for wheezing or shortness of breath. 8 g 3  . atorvastatin (LIPITOR) 20 MG tablet Take 20 mg by mouth daily.    . cetirizine (ZYRTEC) 10 MG tablet Take 10 mg by mouth daily.    . Cholecalciferol (VITAMIN D3) 50 MCG (2000 UT) TABS Take 1 tablet by mouth daily.    Marland Kitchen estradiol (ESTRACE) 0.1 MG/GM vaginal cream Place 0.5 g vaginally 2 (two) times a week.    . fluticasone furoate-vilanterol (BREO ELLIPTA) 200-25 MCG/INH AEPB Inhale 1 puff into the lungs daily. 60 each 5  . ibuprofen  (ADVIL) 200 MG tablet Take 400 mg by mouth every 6 (six) hours as needed for moderate pain.    Marland Kitchen lisinopril-hydrochlorothiazide (ZESTORETIC) 20-25 MG tablet Take 1 tablet by mouth daily.    . meloxicam (MOBIC) 15 MG tablet Take 15 mg by mouth daily.     No facility-administered medications prior to visit.    Review of Systems  Constitutional: Negative for chills, diaphoresis, fever, malaise/fatigue and weight loss.  HENT: Negative for congestion.   Respiratory: Positive for cough. Negative for hemoptysis, sputum production, shortness of breath and wheezing.   Cardiovascular: Negative for chest pain, palpitations and leg swelling.    Objective:   Vitals:   09/27/20 1523  BP: 124/64  Pulse: 89  Temp: (!) 97.1 F (36.2 C)  TempSrc: Tympanic  SpO2: 95%  Weight: 173 lb 2 oz (78.5 kg)  Height: 5\' 5"  (1.651 m)   SpO2: 95 %  Physical Exam: General: Well-appearing, no acute distress HENT: [place], AT Eyes: EOMI, no scleral icterus Respiratory: Clear to auscultation bilaterally.  No crackles, wheezing or rales Cardiovascular: RRR, -M/R/G, no JVD Extremities:-Edema,-tenderness Neuro: AAO x4, CNII-XII grossly intact Skin: Intact, no rashes or bruising Psych: Normal mood, normal affect  Data Reviewed:  Imaging: CTA 03/26/19 - No PE, borderline mediastinal (75mm) and right hilar (54mm) lymph nodes, stable compared to 02/16/19 CT Chest 09/01/19 - Minimal bibasilar interstitial thickening/scarring at the bases with minimal bronchiectasis R>L.  Mild bilateral GGO with improvement in LUL nodule/GGO HRCT  Chest 02/13/20 - Mild subpleural GGO with mild traction bronchiectasis, basilar predominance. No honeycombing. Stable lung nodules  PFT: 09/22/19 FVC 2.46 (85%) FEV1 1.95 (89%) Ratio 76  TLC 76% DLCO 74% Interpretation: Mild restrictive defect with mildly reduced uncorrected DLCO  09/06/20 FVC 2.32 (81%) FEV1 1.88 (88%) Ratio 77  TLC 98% DLCO 70% Interpretation: Mild restrictive defect with  mildly reduced DLCO (corrected)  Labs: CBC 03/26/19 - 10.1 Abs eos 1500  Imaging, labs and test noted above have been reviewed independently by me.  Assessment & Plan:  Discussion: 75 year old female with mild ILD. Auto-immune work-up demonstrated +ANA however no further work-up per Rheumatology. PFTs reviewed in detail with patient and demonstrate stable lung disease. Symptoms well controlled on Dulera however due to limited availability of inhaler in the future, we discussed changing to [facility]. Discussed the difference of how medication in this inhaler is delivered and dosing of medication.   Mild Interstitial Lung Disease Reviewed PFTs. No significant change  Chronic Bronchitis - controlled Chronic cough - controlled --Changed from Dulera to Breo 200-25 mcg ONE puff ONCE a day --CONTINUE zyrtec   Health Maintenance Immunization History  Administered Date(s) Administered  . Influenza Split 05/28/2019, 05/27/2020  . Influenza, High Dose Seasonal PF 06/18/2018, 05/28/2019  . Influenza-Unspecified 09/05/2015, 07/05/2017  . PFIZER([place])SARS-COV-2 Vaccination 10/11/2019, 11/01/2019, 06/19/2020  . Pneumococcal Conjugate-13 07/04/2016, 07/17/2016  . Pneumococcal Polysaccharide-23 12/09/2019  . Tdap 02/11/2020  . Zoster 02/11/2020, 05/27/2020   No orders of the defined types were placed in this encounter.  No orders of the defined types were placed in this encounter.   Return in about 6 months (around 03/27/2021).  I have spent a total time of 31-minutes on the day of the appointment reviewing prior documentation, coordinating care and discussing medical diagnosis and plan with the patient/family. Imaging, labs and tests included in this note have been reviewed and interpreted independently by me.  [place], MD [facility] 09/27/2020 3:54 PM  Office Number [phone]

## 2020-10-11 NOTE — Telephone Encounter (Signed)
Sarah, Please see patient comment and advise.  Thank you.

## 2020-10-11 NOTE — Telephone Encounter (Signed)
SG the pt is out of the dulera and is not able to get any more.  Please advise. Thanks

## 2020-10-14 DIAGNOSIS — N390 Urinary tract infection, site not specified: Secondary | ICD-10-CM | POA: Diagnosis not present

## 2020-10-14 DIAGNOSIS — R768 Other specified abnormal immunological findings in serum: Secondary | ICD-10-CM | POA: Diagnosis not present

## 2020-10-14 DIAGNOSIS — J849 Interstitial pulmonary disease, unspecified: Secondary | ICD-10-CM | POA: Diagnosis not present

## 2020-10-14 DIAGNOSIS — M791 Myalgia, unspecified site: Secondary | ICD-10-CM | POA: Diagnosis not present

## 2020-10-14 DIAGNOSIS — R5383 Other fatigue: Secondary | ICD-10-CM | POA: Diagnosis not present

## 2020-10-14 DIAGNOSIS — R682 Dry mouth, unspecified: Secondary | ICD-10-CM | POA: Diagnosis not present

## 2020-10-18 MED ORDER — ADVAIR HFA 230-21 MCG/ACT IN AERO: 2.0000 | INHALATION_SPRAY | Freq: Two times a day (BID) | RESPIRATORY_TRACT | 12 refills | Status: DC

## 2020-10-18 NOTE — Telephone Encounter (Signed)
Dr. Loanne Drilling, What strength Symbicort do you want sent to her pharmacy?  Please advise.  Thank you.

## 2020-10-18 NOTE — Telephone Encounter (Signed)
Symbicort is not on formulary for this patient's insurance. In the future, please check this before contacting and offering to patient. -JE

## 2020-10-21 ENCOUNTER — Encounter: Payer: Self-pay | Admitting: Pulmonary Disease

## 2020-12-01 DIAGNOSIS — R911 Solitary pulmonary nodule: Secondary | ICD-10-CM | POA: Diagnosis not present

## 2020-12-01 DIAGNOSIS — Z0000 Encounter for general adult medical examination without abnormal findings: Secondary | ICD-10-CM | POA: Diagnosis not present

## 2020-12-01 DIAGNOSIS — Z1211 Encounter for screening for malignant neoplasm of colon: Secondary | ICD-10-CM | POA: Diagnosis not present

## 2020-12-01 DIAGNOSIS — E7800 Pure hypercholesterolemia, unspecified: Secondary | ICD-10-CM | POA: Diagnosis not present

## 2020-12-01 DIAGNOSIS — N952 Postmenopausal atrophic vaginitis: Secondary | ICD-10-CM | POA: Diagnosis not present

## 2020-12-01 DIAGNOSIS — M8580 Other specified disorders of bone density and structure, unspecified site: Secondary | ICD-10-CM | POA: Diagnosis not present

## 2020-12-01 DIAGNOSIS — I10 Essential (primary) hypertension: Secondary | ICD-10-CM | POA: Diagnosis not present

## 2020-12-01 DIAGNOSIS — I700 Atherosclerosis of aorta: Secondary | ICD-10-CM | POA: Diagnosis not present

## 2020-12-01 DIAGNOSIS — M791 Myalgia, unspecified site: Secondary | ICD-10-CM | POA: Diagnosis not present

## 2020-12-01 DIAGNOSIS — J42 Unspecified chronic bronchitis: Secondary | ICD-10-CM | POA: Diagnosis not present

## 2020-12-01 DIAGNOSIS — Z1389 Encounter for screening for other disorder: Secondary | ICD-10-CM | POA: Diagnosis not present

## 2020-12-01 DIAGNOSIS — R768 Other specified abnormal immunological findings in serum: Secondary | ICD-10-CM | POA: Diagnosis not present

## 2020-12-08 DIAGNOSIS — Z1211 Encounter for screening for malignant neoplasm of colon: Secondary | ICD-10-CM | POA: Diagnosis not present

## 2021-01-27 DIAGNOSIS — Z1231 Encounter for screening mammogram for malignant neoplasm of breast: Secondary | ICD-10-CM | POA: Diagnosis not present

## 2021-02-22 DIAGNOSIS — N898 Other specified noninflammatory disorders of vagina: Secondary | ICD-10-CM | POA: Diagnosis not present

## 2021-03-21 ENCOUNTER — Ambulatory Visit (INDEPENDENT_AMBULATORY_CARE_PROVIDER_SITE_OTHER): Payer: Medicare Other | Admitting: Pulmonary Disease

## 2021-03-21 ENCOUNTER — Encounter: Payer: Self-pay | Admitting: Pulmonary Disease

## 2021-03-21 ENCOUNTER — Other Ambulatory Visit: Payer: Self-pay

## 2021-03-21 VITALS — BP 122/72 | HR 82 | Ht 65.0 in | Wt 167.0 lb

## 2021-03-21 DIAGNOSIS — J069 Acute upper respiratory infection, unspecified: Secondary | ICD-10-CM

## 2021-03-21 DIAGNOSIS — J42 Unspecified chronic bronchitis: Secondary | ICD-10-CM

## 2021-03-21 DIAGNOSIS — J849 Interstitial pulmonary disease, unspecified: Secondary | ICD-10-CM | POA: Diagnosis not present

## 2021-03-21 DIAGNOSIS — J209 Acute bronchitis, unspecified: Secondary | ICD-10-CM | POA: Insufficient documentation

## 2021-03-21 NOTE — Progress Notes (Signed)
Subjective:   PATIENT ID: Ann [name] GENDER: female DOB: 06-27-1946, MRN: [id number]   HPI  Chief Complaint  Patient presents with   Interstitial Lung Disease    Reason for Visit: Follow-up  [name] is a 75 year old female former smoker with ILD who presents for follow-up with PFTs.  Synopsis: Initially referred for chronic cough and diagnosed with mild ILD. Autoimmune work-up revealed +ANA 1:160 with myositis panel, sjogren's antibodies and scleroderma screen returning negative. Rheumatology with no further work-up indicated.  Since our last visit six months ago, she reports her respiratory symptoms are overall stable on Advair. She was previously on [facility] but switched to available inhaler without any worsening in symptoms. Over a week ago she did develop a recent cold (COVID-19 neg) that has improved. No shortness of breath or wheezing but has a residual cough with greenish sputum. Otherwise no complaints. Denies exacerbations or ED visits requiring steroids or antibiotics for respiratory infections.  Social History: Former smoker. Quit 1976.  I have personally reviewed patient's past medical/family/social history/allergies/current medications.  Past Medical History:  Diagnosis Date   Bronchitis, chronic (HCC)    Hyperlipidemia    Hypertension    Pneumonia    Pulmonary nodule    Sepsis ([place])      Outpatient Medications Prior to Visit  Medication Sig Dispense Refill   albuterol (VENTOLIN HFA) 108 (90 Base) MCG/ACT inhaler Inhale 1-2 puffs into the lungs every 6 (six) hours as needed for wheezing or shortness of breath. 8 g 3   atorvastatin (LIPITOR) 20 MG tablet Take 20 mg by mouth daily.     cetirizine (ZYRTEC) 10 MG tablet Take 10 mg by mouth daily.     Cholecalciferol (VITAMIN D3) 50 MCG (2000 UT) TABS Take 1 tablet by mouth daily.     estradiol (ESTRACE) 0.1 MG/GM vaginal cream Place 0.5 g vaginally 2 (two) times a week.     fluticasone  furoate-vilanterol (BREO ELLIPTA) 200-25 MCG/INH AEPB Inhale 1 puff into the lungs daily. 60 each 5   fluticasone-salmeterol (ADVAIR HFA) 230-21 MCG/ACT inhaler Inhale 2 puffs into the lungs 2 (two) times daily. 1 each 12   ibuprofen (ADVIL) 200 MG tablet Take 400 mg by mouth every 6 (six) hours as needed for moderate pain.     lisinopril-hydrochlorothiazide (ZESTORETIC) 20-25 MG tablet Take 1 tablet by mouth daily.     meloxicam (MOBIC) 15 MG tablet Take 15 mg by mouth daily.     No facility-administered medications prior to visit.    Review of Systems  Constitutional:  Negative for chills, diaphoresis, fever, malaise/fatigue and weight loss.  HENT:  Negative for congestion.   Respiratory:  Positive for cough and sputum production. Negative for hemoptysis, shortness of breath and wheezing.   Cardiovascular:  Negative for chest pain, palpitations and leg swelling.   Objective:   Vitals:   03/21/21 1113  BP: 122/72  Pulse: 82  SpO2: 99%  Weight: 167 lb (75.8 kg)  Height: 5\' 5"  (1.651 m)   SpO2: 99 %  Physical Exam: General: Well-appearing, no acute distress HENT: [place], AT Eyes: EOMI, no scleral icterus Respiratory: Clear to auscultation bilaterally.  No crackles, wheezing or rales Cardiovascular: RRR, -M/R/G, no JVD Extremities:-Edema,-tenderness Neuro: AAO x4, CNII-XII grossly intact Psych: Normal mood, normal affect   Data Reviewed:  Imaging: CTA 03/26/19 - No PE, borderline mediastinal (55mm) and right hilar (10mm) lymph nodes, stable compared to 02/16/19 CT Chest 09/01/19 - Minimal bibasilar interstitial thickening/scarring  at the bases with minimal bronchiectasis R>L.  Mild bilateral GGO with improvement in LUL nodule/GGO HRCT Chest 02/13/20 - Mild subpleural GGO with mild traction bronchiectasis, basilar predominance. No honeycombing. Stable lung nodules  PFT: 09/22/19 FVC 2.46 (85%) FEV1 1.95 (89%) Ratio 76  TLC 76% DLCO 74% Interpretation: Mild restrictive defect with  mildly reduced uncorrected DLCO  09/06/20 FVC 2.32 (81%) FEV1 1.88 (88%) Ratio 77  TLC 98% DLCO 70% Interpretation: Mild restrictive defect with mildly reduced DLCO (corrected)  Labs: CBC 03/26/19 - 10.1 Abs eos 1500  Imaging, labs and test noted above have been reviewed independently by me.  Assessment & Plan:  Discussion: 75 year old female with mild ILD. Auto-immune work-up ANA however no further work-up per Rheumatology. PFTs stable >1 year. Advised to contact office if worsening respiratory symptoms that are persistent beyond standard illnesses to evaluate for PFTs sooner, otherwise will re-evaluate lung function 18 months from last test.  Chronic bronchitis symptoms well controlled on ICS/LABA. Recent flare-up with viral URI which is improving with supportive care.  Viral URI --Recent sick contacts with residual productive cough --CONTINUE home inhalers --No indication for antibiotics  Mild ILD --Schedule PFTs 03/2022  Chronic Bronchitis --CONTINUE Advair 230-21 mcg TWO puffs TWICE a day --OK to use THREE times a day for shortness of breath or wheezing as needed --CONTINUE zyrtec   Health Maintenance Immunization History  Administered Date(s) Administered   Influenza Split 05/28/2019, 05/27/2020   Influenza, High Dose Seasonal PF 06/18/2018, 05/28/2019   Influenza-Unspecified 09/05/2015, 07/05/2017   PFIZER(Purple Top)SARS-COV-2 Vaccination 10/11/2019, 11/01/2019, 06/19/2020   Pneumococcal Conjugate-13 07/04/2016, 07/17/2016   Pneumococcal Polysaccharide-23 12/09/2019   Tdap 02/11/2020   Zoster, Live 02/11/2020, 05/27/2020   No orders of the defined types were placed in this encounter.  No orders of the defined types were placed in this encounter.   No follow-ups on file.  I have spent a total time of 31-minutes on the day of the appointment reviewing prior documentation, coordinating care and discussing medical diagnosis and plan with the patient/family. Imaging,  labs and tests included in this note have been reviewed and interpreted independently by me.  [place], MD [facility] 03/21/2021 7:33 PM  Office Number [phone]

## 2021-03-21 NOTE — Patient Instructions (Addendum)
Viral URI --Recent sick contacts with residual productive cough --CONTINUE home inhalers --No indication for antibiotics  Mild ILD --Schedule PFTs 03/2022  Chronic Bronchitis --CONTINUE Advair 230-21 mcg TWO puffs TWICE a day --OK to use THREE times a day for shortness of breath or wheezing as needed  Follow-up with me in 6 months

## 2021-03-24 ENCOUNTER — Encounter (HOSPITAL_COMMUNITY): Payer: Self-pay

## 2021-03-24 ENCOUNTER — Observation Stay (HOSPITAL_COMMUNITY)
Admission: EM | Admit: 2021-03-24 | Discharge: 2021-03-25 | Disposition: A | Discharge status: Home / Self Care | Payer: Medicare Other | Attending: Emergency Medicine | Admitting: Emergency Medicine

## 2021-03-24 ENCOUNTER — Other Ambulatory Visit: Payer: Self-pay

## 2021-03-24 ENCOUNTER — Emergency Department (HOSPITAL_COMMUNITY): Payer: Medicare Other

## 2021-03-24 DIAGNOSIS — R9389 Abnormal findings on diagnostic imaging of other specified body structures: Secondary | ICD-10-CM | POA: Insufficient documentation

## 2021-03-24 DIAGNOSIS — Z87891 Personal history of nicotine dependence: Secondary | ICD-10-CM | POA: Insufficient documentation

## 2021-03-24 DIAGNOSIS — R2681 Unsteadiness on feet: Secondary | ICD-10-CM | POA: Diagnosis not present

## 2021-03-24 DIAGNOSIS — R531 Weakness: Secondary | ICD-10-CM

## 2021-03-24 DIAGNOSIS — I10 Essential (primary) hypertension: Secondary | ICD-10-CM | POA: Diagnosis not present

## 2021-03-24 DIAGNOSIS — Z79899 Other long term (current) drug therapy: Secondary | ICD-10-CM | POA: Insufficient documentation

## 2021-03-24 DIAGNOSIS — R41841 Cognitive communication deficit: Secondary | ICD-10-CM | POA: Diagnosis not present

## 2021-03-24 DIAGNOSIS — R4182 Altered mental status, unspecified: Secondary | ICD-10-CM

## 2021-03-24 DIAGNOSIS — R410 Disorientation, unspecified: Secondary | ICD-10-CM | POA: Diagnosis not present

## 2021-03-24 DIAGNOSIS — N390 Urinary tract infection, site not specified: Secondary | ICD-10-CM | POA: Diagnosis not present

## 2021-03-24 DIAGNOSIS — K449 Diaphragmatic hernia without obstruction or gangrene: Secondary | ICD-10-CM | POA: Diagnosis not present

## 2021-03-24 DIAGNOSIS — G454 Transient global amnesia: Principal | ICD-10-CM | POA: Insufficient documentation

## 2021-03-24 DIAGNOSIS — I6782 Cerebral ischemia: Secondary | ICD-10-CM | POA: Diagnosis not present

## 2021-03-24 DIAGNOSIS — I671 Cerebral aneurysm, nonruptured: Secondary | ICD-10-CM

## 2021-03-24 DIAGNOSIS — Z20822 Contact with and (suspected) exposure to covid-19: Secondary | ICD-10-CM | POA: Insufficient documentation

## 2021-03-24 LAB — COMPREHENSIVE METABOLIC PANEL
ALT: 30 U/L (ref 0–44)
AST: 22 U/L (ref 15–41)
Albumin: 4.2 g/dL (ref 3.5–5.0)
Alkaline Phosphatase: 45 U/L (ref 38–126)
Anion gap: 9 (ref 5–15)
BUN: 23 mg/dL (ref 8–23)
CO2: 27 mmol/L (ref 22–32)
Calcium: 9.5 mg/dL (ref 8.9–10.3)
Chloride: 102 mmol/L (ref 98–111)
Creatinine, Ser: 0.82 mg/dL (ref 0.44–1.00)
GFR, Estimated: >60 mL/min (ref >60)
Glucose, Bld: 112 mg/dL — ABNORMAL HIGH (ref 70–99)
Potassium: 3.9 mmol/L (ref 3.5–5.1)
Sodium: 138 mmol/L (ref 135–145)
Total Bilirubin: 1 mg/dL (ref 0.3–1.2)
Total Protein: 6.9 g/dL (ref 6.5–8.1)

## 2021-03-24 LAB — CBC WITH DIFFERENTIAL/PLATELET
Abs Immature Granulocytes: 0.04 10*3/uL (ref 0.00–0.07)
Basophils Absolute: 0.1 10*3/uL (ref 0.0–0.1)
Basophils Relative: 1 %
Eosinophils Absolute: 0.2 10*3/uL (ref 0.0–0.5)
Eosinophils Relative: 2 %
HCT: 41.6 % (ref 36.0–46.0)
Hemoglobin: 14.1 g/dL (ref 12.0–15.0)
Immature Granulocytes: 0 %
Lymphocytes Relative: 18 %
Lymphs Abs: 1.6 10*3/uL (ref 0.7–4.0)
MCH: 30.1 pg (ref 26.0–34.0)
MCHC: 33.9 g/dL (ref 30.0–36.0)
MCV: 88.9 fL (ref 80.0–100.0)
Monocytes Absolute: 0.6 10*3/uL (ref 0.1–1.0)
Monocytes Relative: 7 %
Neutro Abs: 6.6 10*3/uL (ref 1.7–7.7)
Neutrophils Relative %: 72 %
Platelets: 283 10*3/uL (ref 150–400)
RBC: 4.68 MIL/uL (ref 3.87–5.11)
RDW: 13.8 % (ref 11.5–15.5)
WBC: 9.1 10*3/uL (ref 4.0–10.5)
nRBC: 0 % (ref 0.0–0.2)

## 2021-03-24 IMAGING — CT CT HEAD W/O CM
3 series · 15 of 47 positions shown, 18 images · non-contrast
Comparison: None.

CLINICAL DATA: Delirium/confusion

EXAM:
CT HEAD WITHOUT CONTRAST
TECHNIQUE: Contiguous axial images were obtained from the base of the skull
through the vertex without intravenous contrast.

[Series 2: head wo · axial · 0.47mm/px · z∈[-289,-159]mm · 9 of 32 slices shown, 12 images]
[im 3/32  brain]
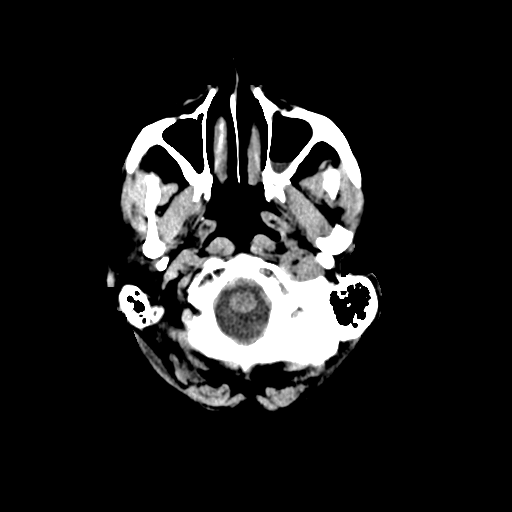
[im 3/32  bone]
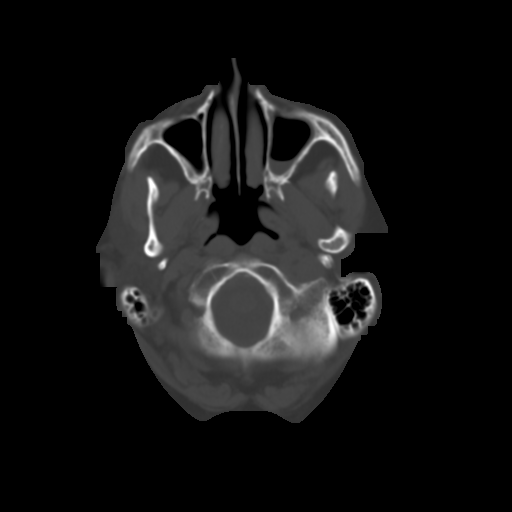
[im 6/32  brain]
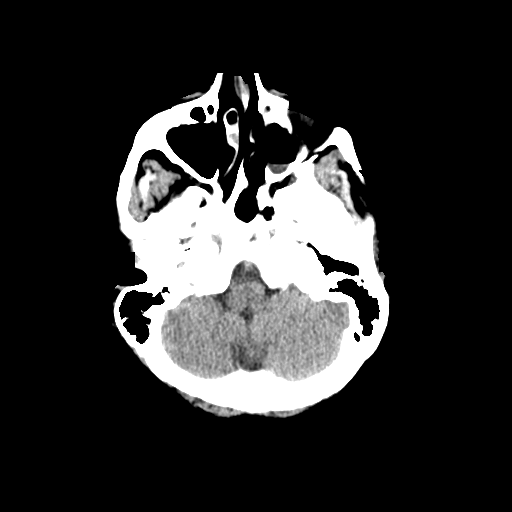
[im 9/32  brain]
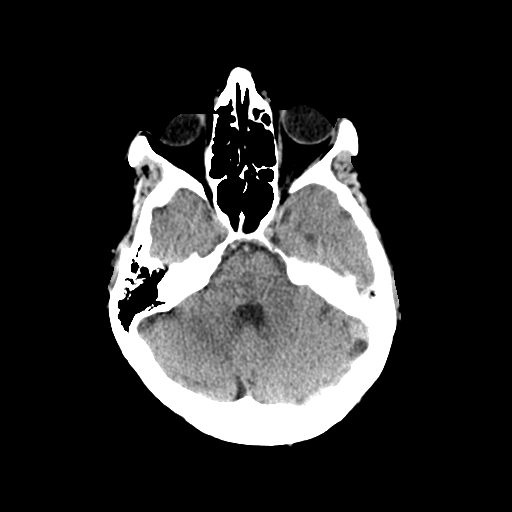
[im 12/32  brain]
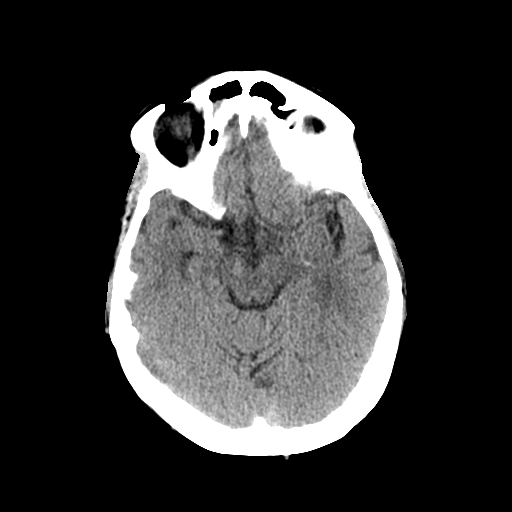
[im 17/32  brain]
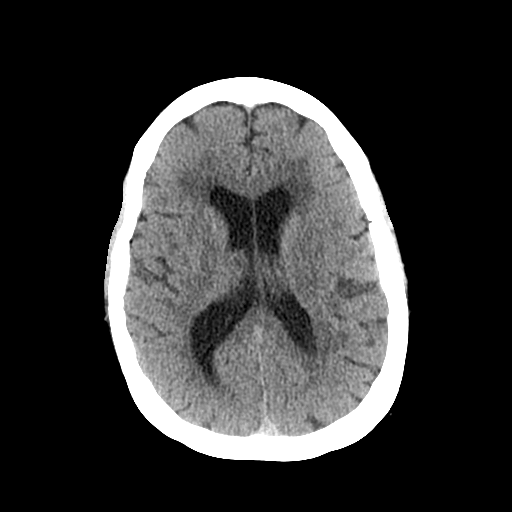
[im 17/32  bone]
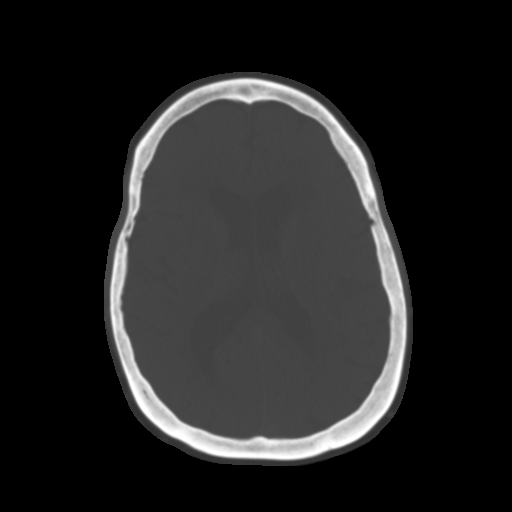
[im 20/32  brain]
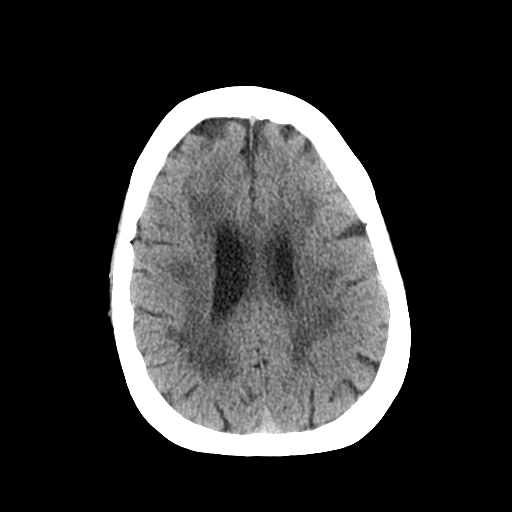
[im 23/32  brain]
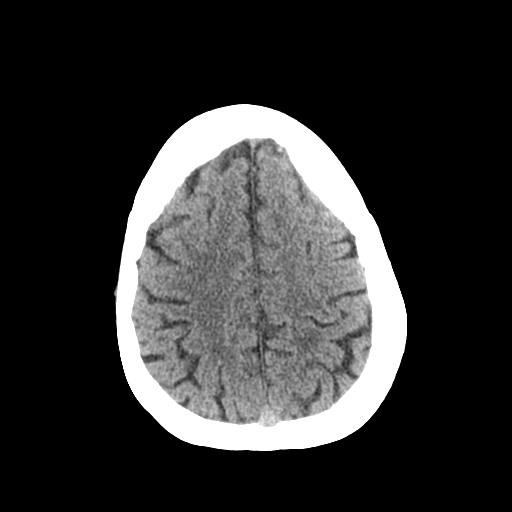
[im 26/32  brain]
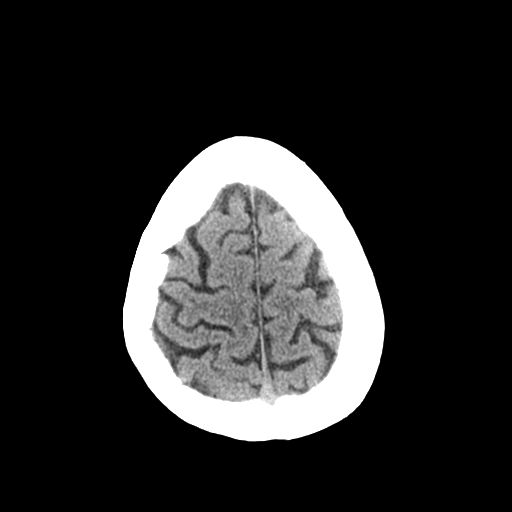
[im 29/32  brain]
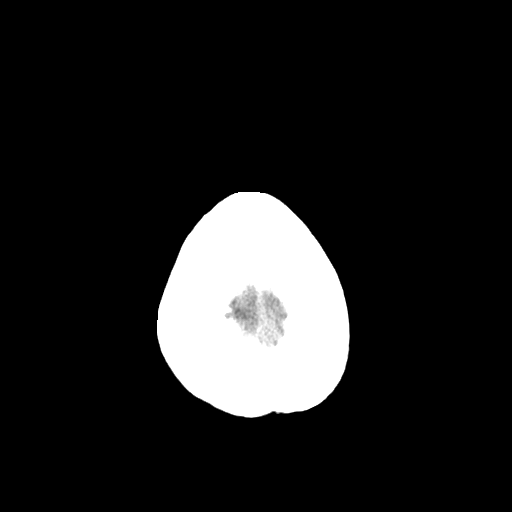
[im 29/32  bone]
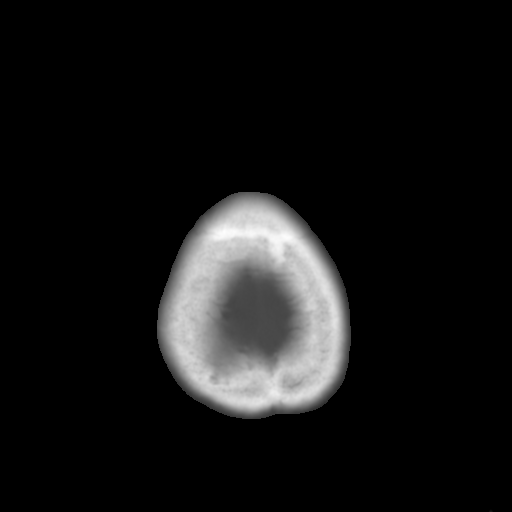

[Series 4: coronal soft tissue · coronal · 0.34mm/px · 3 of 63 slices shown]
[im 21/63  brain]
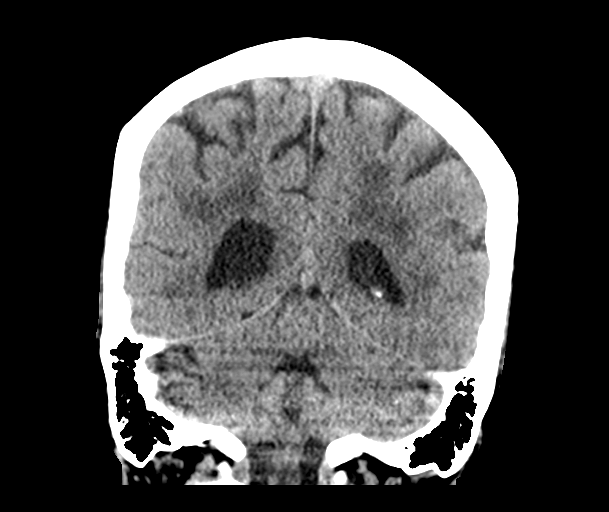
[im 28/63  brain]
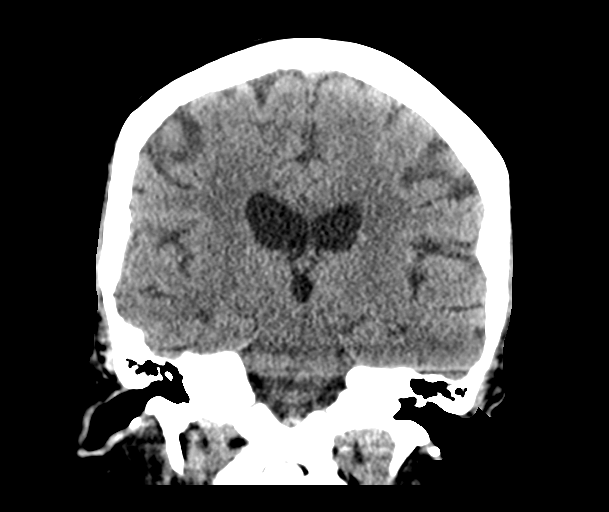
[im 35/63  brain]
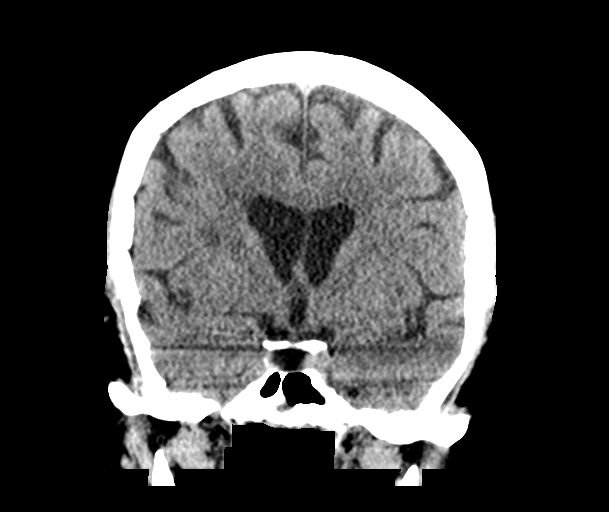

[Series 5: sagittal soft tissue · sagittal · 0.36mm/px · 3 of 51 slices shown]
[im 17/51  brain]
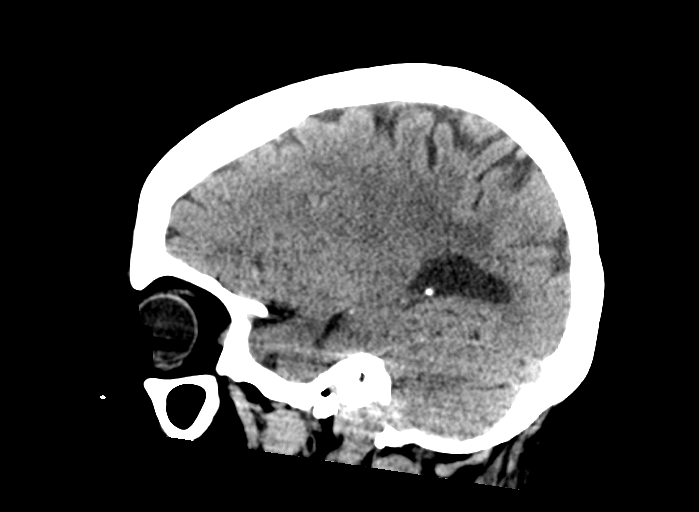
[im 26/51  brain]
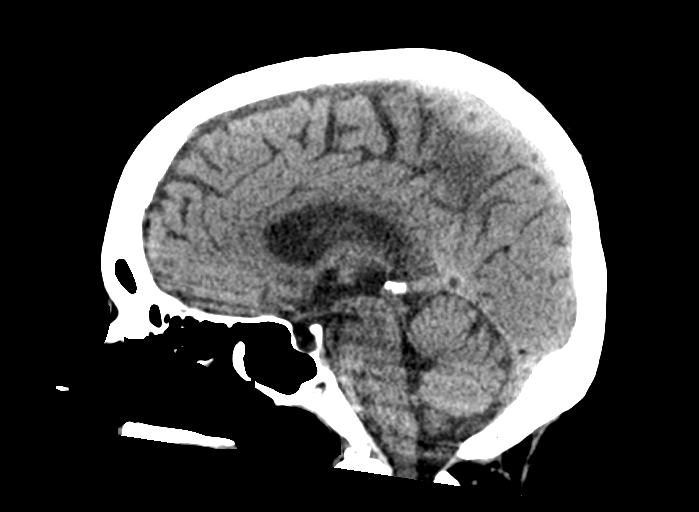
[im 34/51  brain]
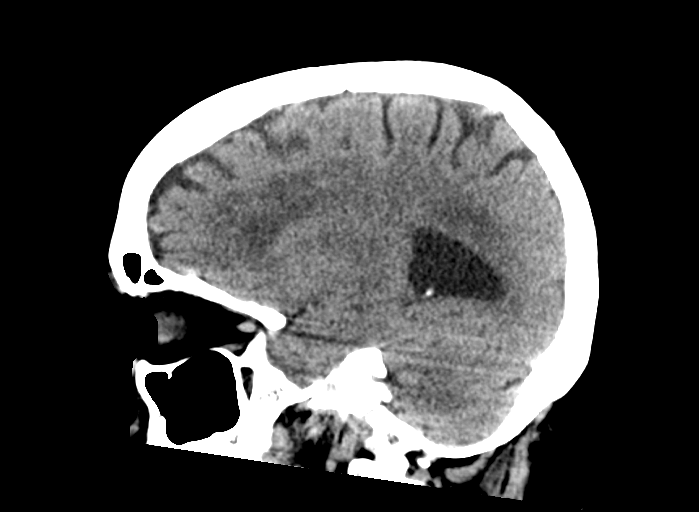

[15 of 47 positions shown; findings below may reference images not displayed]

FINDINGS: Brain: No evidence of acute infarction, hemorrhage, hydrocephalus,
extra-axial collection or mass lesion/mass effect.

Subcortical white matter and periventricular small vessel ischemic
changes.

Vascular: No hyperdense vessel or unexpected calcification.

Skull: Normal. Negative for fracture or focal lesion.

Sinuses/Orbits: Partial opacification of the right frontal and left
maxillary sinus. Mastoid air cells are clear.

Other: None.
IMPRESSION: No evidence of acute intracranial abnormality. Small vessel ischemic
changes.

## 2021-03-24 NOTE — ED Provider Notes (Signed)
Emergency Medicine Provider Triage Evaluation Note  Ann Ann [name] , Ann [name] 75 y.o. female  was evaluated in triage.  Pt complains of altered mental status.  Apparently patient was upset that the dog had gotten his head stuck in the fence.  Husband left bring the dog to the vet.  He called the patient while he was at the vet Ann [name] few hours ago and patient did not know the anything it happened to the dog.  When husband arrived home patient was confused.  She is unsure of the year.  No prior history of delirium, AMS.  No fever, chills, emesis, abdominal pain, dysuria, urinary frequency.  No paresthesias, weakness, facial droop, difficulty with word finding  Review of Systems  Positive: AMS Negative: Slurred speech, weakness, numbness, facial droop, head trauma, dysuria, hematuria  Physical Exam  BP (!) 147/87 (BP Location: Right Arm)   Pulse 97   Temp 98.3 F (36.8 C) (Oral)   Resp 15   Ht 5\' 5"  (1.651 m)   Wt 70.3 kg   SpO2 94%   BMI 25.79 kg/m  Gen:   Awake, no distress   Resp:  Normal effort  MSK:   Moves extremities without difficulty  Neuro:  Cranial nerves II through 12 grossly intact, equal handgrip bilaterally, intact sensation Other:  Unsure of year, how old she is  Medical Decision Making  Medically screening exam initiated at 7:25 PM.  Appropriate orders placed.  Ann Ann [name] was informed that the remainder of the evaluation will be completed by another provider, this initial triage assessment does not replace that evaluation, and the importance of remaining in the ED until their evaluation is complete.  Altered mental status  Patient is not Ann [name] code stroke.  No deficits on exam.   Ann Scalia A, PA-C 03/24/21 1927    Drenda Freeze, MD 03/28/21 1455

## 2021-03-24 NOTE — ED Triage Notes (Signed)
Pts husband reports sudden onset confusion earlier today. Pt is A&O x3 at this time. Pt denies numbness, weakness, and tingling.

## 2021-03-25 ENCOUNTER — Observation Stay (HOSPITAL_COMMUNITY): Payer: Medicare Other

## 2021-03-25 ENCOUNTER — Encounter (HOSPITAL_COMMUNITY): Payer: Self-pay | Admitting: Emergency Medicine

## 2021-03-25 ENCOUNTER — Emergency Department (HOSPITAL_COMMUNITY): Payer: Medicare Other

## 2021-03-25 DIAGNOSIS — Z79899 Other long term (current) drug therapy: Secondary | ICD-10-CM | POA: Diagnosis not present

## 2021-03-25 DIAGNOSIS — N390 Urinary tract infection, site not specified: Secondary | ICD-10-CM | POA: Diagnosis not present

## 2021-03-25 DIAGNOSIS — I671 Cerebral aneurysm, nonruptured: Secondary | ICD-10-CM

## 2021-03-25 DIAGNOSIS — R410 Disorientation, unspecified: Secondary | ICD-10-CM | POA: Diagnosis not present

## 2021-03-25 DIAGNOSIS — R4182 Altered mental status, unspecified: Secondary | ICD-10-CM | POA: Diagnosis not present

## 2021-03-25 DIAGNOSIS — I6782 Cerebral ischemia: Secondary | ICD-10-CM | POA: Diagnosis not present

## 2021-03-25 DIAGNOSIS — R9389 Abnormal findings on diagnostic imaging of other specified body structures: Secondary | ICD-10-CM | POA: Diagnosis not present

## 2021-03-25 DIAGNOSIS — G454 Transient global amnesia: Secondary | ICD-10-CM

## 2021-03-25 DIAGNOSIS — Z87891 Personal history of nicotine dependence: Secondary | ICD-10-CM | POA: Diagnosis not present

## 2021-03-25 DIAGNOSIS — K449 Diaphragmatic hernia without obstruction or gangrene: Secondary | ICD-10-CM | POA: Diagnosis not present

## 2021-03-25 DIAGNOSIS — I639 Cerebral infarction, unspecified: Secondary | ICD-10-CM | POA: Diagnosis not present

## 2021-03-25 DIAGNOSIS — Z20822 Contact with and (suspected) exposure to covid-19: Secondary | ICD-10-CM | POA: Diagnosis not present

## 2021-03-25 DIAGNOSIS — I10 Essential (primary) hypertension: Secondary | ICD-10-CM | POA: Diagnosis not present

## 2021-03-25 DIAGNOSIS — G459 Transient cerebral ischemic attack, unspecified: Secondary | ICD-10-CM | POA: Diagnosis not present

## 2021-03-25 DIAGNOSIS — R2681 Unsteadiness on feet: Secondary | ICD-10-CM | POA: Diagnosis not present

## 2021-03-25 DIAGNOSIS — R41841 Cognitive communication deficit: Secondary | ICD-10-CM | POA: Diagnosis not present

## 2021-03-25 LAB — URINALYSIS, ROUTINE W REFLEX MICROSCOPIC
Bilirubin Urine: NEGATIVE
Glucose, UA: NEGATIVE mg/dL
Hgb urine dipstick: NEGATIVE
Ketones, ur: NEGATIVE mg/dL
Nitrite: NEGATIVE
Protein, ur: NEGATIVE mg/dL
Specific Gravity, Urine: 1.005 (ref 1.005–1.030)
pH: 7 (ref 5.0–8.0)

## 2021-03-25 LAB — CBG MONITORING, ED: Glucose-Capillary: 106 mg/dL — ABNORMAL HIGH (ref 70–99)

## 2021-03-25 LAB — HEMOGLOBIN A1C
Hgb A1c MFr Bld: 6.1 % — ABNORMAL HIGH (ref 4.8–5.6)
Mean Plasma Glucose: 128.37 mg/dL

## 2021-03-25 LAB — RESP PANEL BY RT-PCR (FLU A&B, COVID) ARPGX2
Influenza A by PCR: NEGATIVE
Influenza B by PCR: NEGATIVE
SARS Coronavirus 2 by RT PCR: NEGATIVE

## 2021-03-25 LAB — LIPID PANEL
Cholesterol: 136 mg/dL (ref 0–200)
HDL: 38 mg/dL — ABNORMAL LOW (ref >40)
LDL Cholesterol: 84 mg/dL (ref 0–99)
Total CHOL/HDL Ratio: 3.6 RATIO
Triglycerides: 71 mg/dL (ref <150)
VLDL: 14 mg/dL (ref 0–40)

## 2021-03-25 IMAGING — MR MR HEAD W/O CM
20 of 28 series · 36 of 48 positions shown · IV contrast (Contrast agent)
Comparison: CT head [DATE].

CLINICAL DATA: TIA versus acute stroke.  Memory loss.

EXAM:
MRI HEAD WITHOUT CONTRAST
MRA HEAD WITHOUT CONTRAST
MRA NECK WITHOUT AND WITH CONTRAST
TECHNIQUE: Multiplanar, multi-echo pulse sequences of the brain and surrounding
structures were acquired without intravenous contrast. Angiographic
images of the Circle of Willis were acquired using MRA technique
without intravenous contrast. Angiographic images of the neck were
acquired using MRA technique without and with intravenous contrast.
Carotid stenosis measurements (when applicable) are obtained
utilizing NASCET criteria, using the distal internal carotid
diameter as the denominator.
CONTRAST:  8mL GADAVIST GADOBUTROL 1 MMOL/ML IV SOLN

[Series 5: DWI · axial · 3.0mm · 0.88mm/px · z∈[-65,+76]mm · 2 of 96 slices shown (1 of 4)]
[im 1/96]
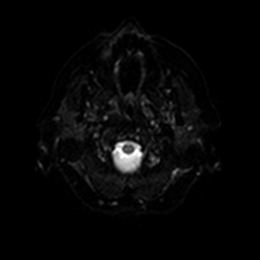
[im 96/96]
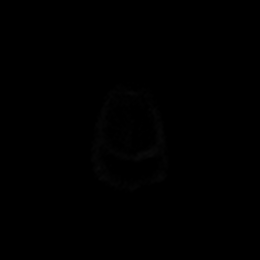

[Series 6: DWI · axial · 3.0mm · 0.88mm/px · 1 of 47 slices shown (2 of 4)]
[im 1/47]
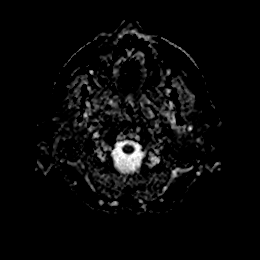

[Series 7: DWI · coronal · 4.0mm · 0.88mm/px · 1 of 64 slices shown (3 of 4)]
[im 1/64]
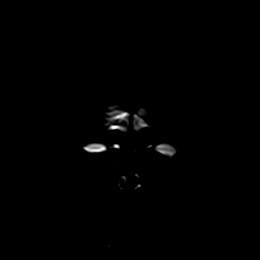

[Series 8: DWI · coronal · 4.0mm · 0.88mm/px · 1 of 32 slices shown (4 of 4)]
[im 1/32]
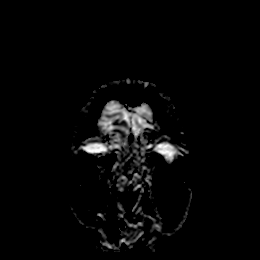

[Series 13: T1 · sagittal · 5.0mm · 0.75mm/px · 1 of 23 slices shown]
[im 1/23]
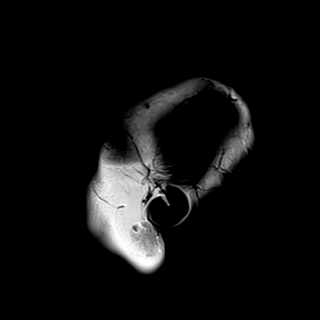

[Series 14: T2 · axial · 5.0mm · 0.72mm/px · 1 of 25 slices shown]
[im 1/25]
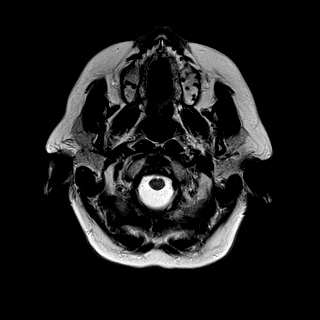

[Series 15: FLAIR · axial · 5.0mm · 0.45mm/px · 1 of 25 slices shown]
[im 1/25]
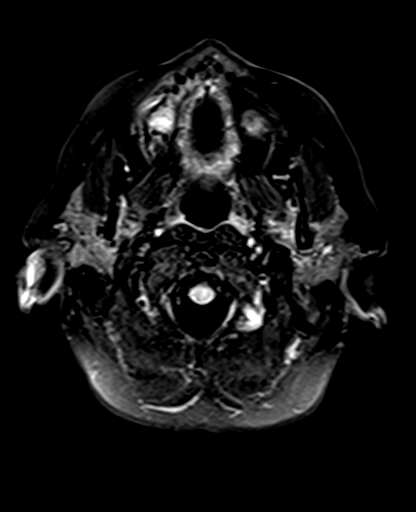

[Series 16: mag_images · axial · 3.0mm · 0.90mm/px · 1 of 60 slices shown]
[im 1/60]
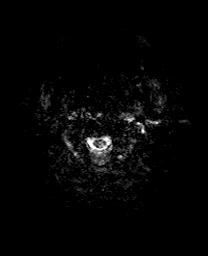

[Series 17: pha_images · axial · 3.0mm · 0.90mm/px · z∈[-80,+88]mm · 2 of 57 slices shown]
[im 1/57]
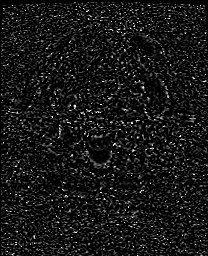
[im 57/57]
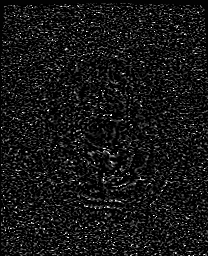

[Series 18: swi_images · axial · 3.0mm · 0.90mm/px · z∈[-80,+97]mm · 2 of 60 slices shown]
[im 1/60]
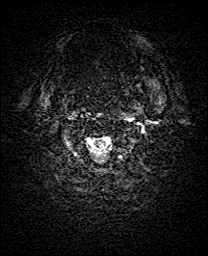
[im 60/60]
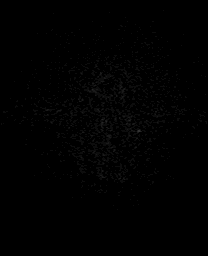

[Series 19: mip_images(sw) · axial · 24.0mm · 0.90mm/px · z∈[-70,+86]mm · 2 of 53 slices shown]
[im 1/53]
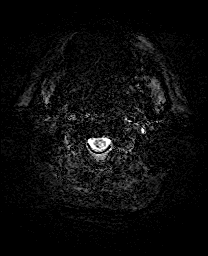
[im 53/53]
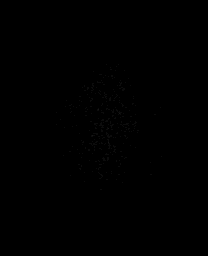

[Series 21: T2 post-contrast · coronal · 5.0mm · 0.72mm/px · 1 of 28 slices shown]
[im 1/28]
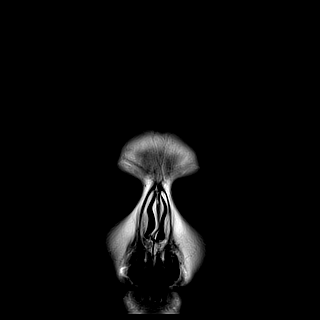

[Series 27: tof_fl3d_tra_iso · axial · 0.6mm · 0.52mm/px · z∈[-178,-60]mm · 6 of 198 slices shown]
[im 1/198]
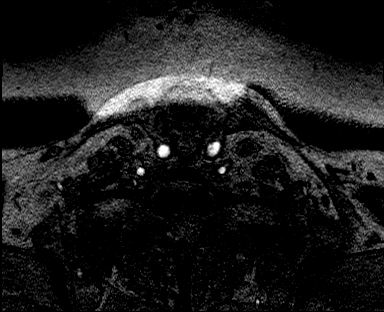
[im 40/198]
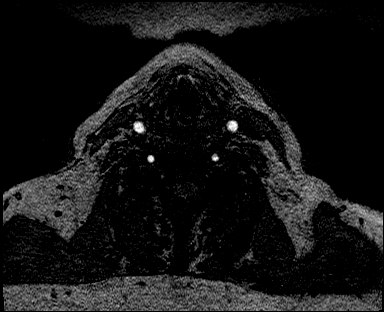
[im 79/198]
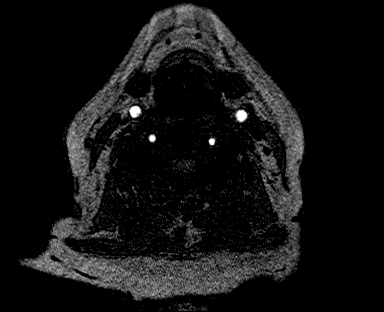
[im 119/198]
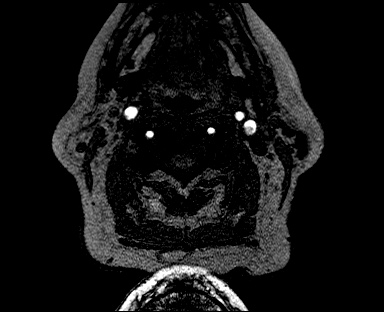
[im 158/198]
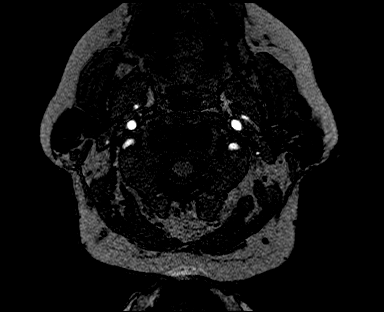
[im 198/198]
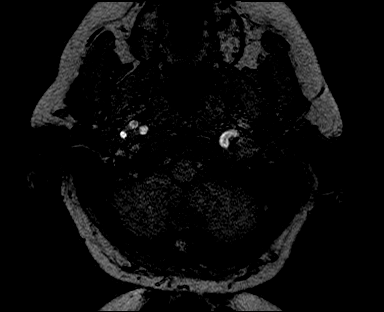

[Series 30: angio_fl3d_cor_pre_ttc=3.0s · coronal · 0.9mm · 0.85mm/px · 2 of 80 slices shown]
[im 1/80]
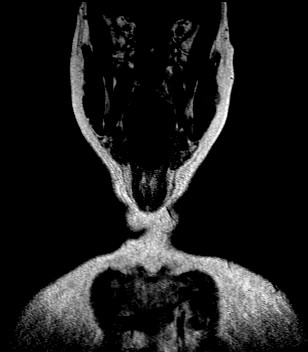
[im 80/80]
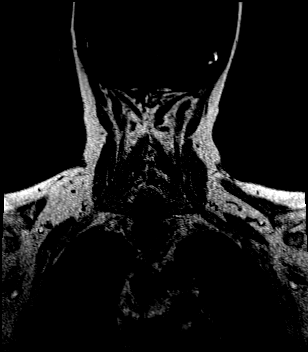

[Series 34: angio_fl3d_cor_post_ttc=3.0s · coronal · 0.9mm · 0.85mm/px · 2 of 80 slices shown (1 of 2)]
[im 1/80]
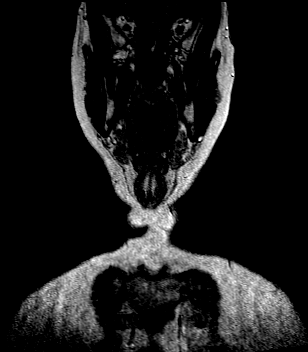
[im 80/80]
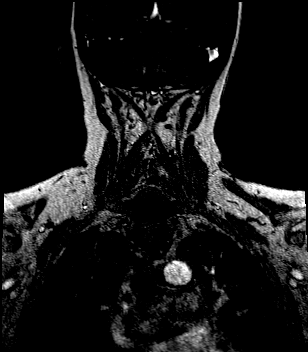

[Series 35: angio_fl3d_cor_post_ttc=3.0s_moco-adv · coronal · 0.9mm · 0.85mm/px · 2 of 80 slices shown (1 of 2)]
[im 1/80]
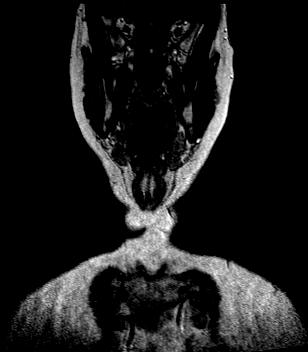
[im 80/80]
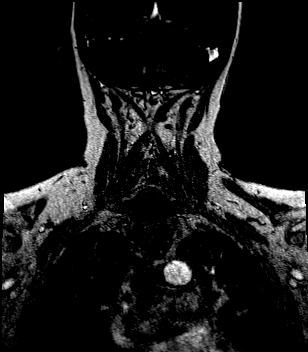

[Series 36: angio_fl3d_cor_post_ttc=3.0s_moco-adv_sub · coronal · 0.9mm · 0.85mm/px · 2 of 80 slices shown (1 of 2)]
[im 1/80]
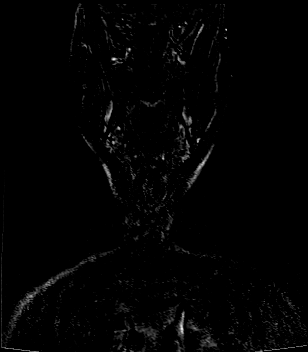
[im 80/80]
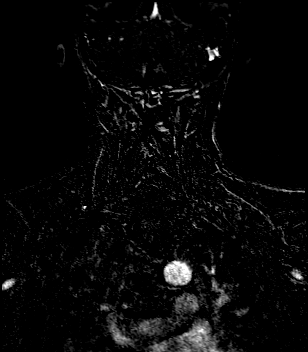

[Series 38: angio_fl3d_cor_post_ttc=3.0s · coronal · 0.9mm · 0.85mm/px · 2 of 80 slices shown (2 of 2)]
[im 1/80]
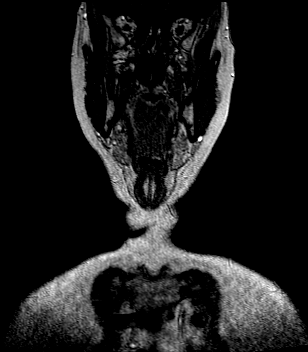
[im 80/80]
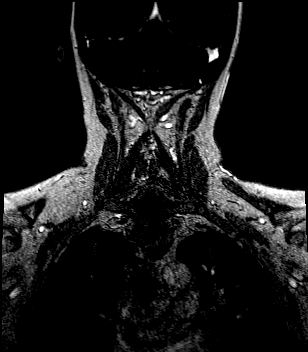

[Series 39: angio_fl3d_cor_post_ttc=3.0s_moco-adv · coronal · 0.9mm · 0.85mm/px · 2 of 80 slices shown (2 of 2)]
[im 1/80]
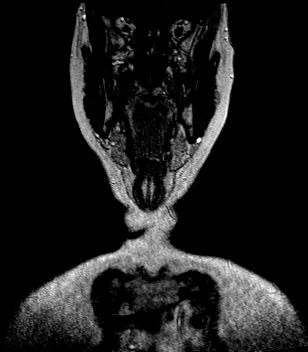
[im 80/80]
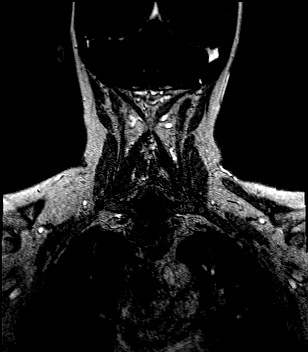

[Series 40: angio_fl3d_cor_post_ttc=3.0s_moco-adv_sub · coronal · 0.9mm · 0.85mm/px · 2 of 80 slices shown (2 of 2)]
[im 1/80]
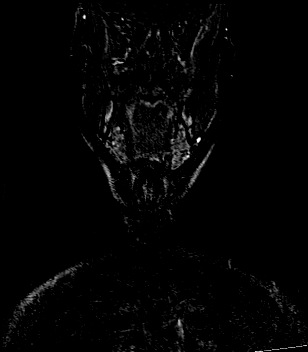
[im 80/80]
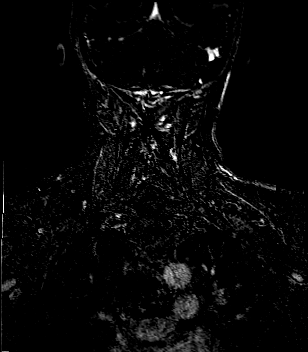

[36 of 48 positions shown; findings below may reference images not displayed]

FINDINGS: MRI HEAD FINDINGS

Brain: Punctate focus of restricted diffusion in the lateral
hippocampal body (series 5, image 66). No acute hemorrhage,
hydrocephalus, mass lesion, abnormal mass effect, or extra-axial
fluid collection. Moderate patchy T2/FLAIR hyperintensities within
the supratentorial and pontine white matter, which are nonspecific
but most likely related to chronic microvascular ischemic disease
given patient age and risk factors (including hypertension and
hyperlipidemia). Mild atrophy with ex vacuo ventricular dilation.
Partially empty sella.

Vascular: See below.

Skull and upper cervical spine: Ill-defined FLAIR hyperintensity and
T1 hypointensity within the right frontal calvarium.

Sinuses/Orbits: Right greater than left frontal sinus mucosal
thickening. Patchy ethmoid air cell mucosal thickening. Left
maxillary sinus and bilateral sphenoid sinus mucosal thickening with
air-fluid levels.

Other: Trace right mastoid fluid.

MRA HEAD FINDINGS

Anterior circulation: Bilateral intracranial ICAs, MCAs, and ACAs
are patent without proximal flow limiting stenosis. There is a large
(approximately 5-6 mm) saccular aneurysm arising from the left MCA
bifurcation.

Posterior circulation: The visualized intradural vertebral arteries,
basilar artery and posterior cerebral arteries are patent without
evidence of a proximal hemodynamically significant stenosis.

MRA NECK FINDINGS

Aortic arch: Great vessel origins are patent.

Carotid system: Patent. Mild bilateral carotid bifurcation narrowing
without greater than 50% stenosis.

Vertebral arteries: Codominant. Patent. No evidence of greater than
50% stenosis.
IMPRESSION: MRI:

1. Punctate focus of restricted diffusion in the lateral hippocampal
body, which may represent transient global amnesia in this patient
with reported memory loss. A small acute infarct is a differential
consideration.
2. Ill-defined FLAIR hyperintensity and T1 hypointensity within the
right frontal calvarium. While nonspecific, osseous metastatic
disease can have this appearance. Postcontrast MRI is recommended to
assess for associated enhancement.
3. Moderate chronic microvascular ischemic disease and mild atrophy.
4. Paranasal sinus mucosal thickening with air-fluid levels, as
described above.

MRA Head:

1. Large (approximately 5-6 mm) left MCA bifurcation saccular
aneurysm. Recommend neurosurgical consultation.
2. No large vessel occlusion or proximal hemodynamically significant
stenosis.

MRA Neck:

No evidence of greater than 50% stenosis.

Findings and recommendations discussed with Dr. MALILO Via
telephone at [DATE].

## 2021-03-25 IMAGING — MR MR MRA HEAD W/O CM
20 of 28 series · 36 of 48 positions shown · IV contrast (gadavist)
Comparison: CT head [DATE].

CLINICAL DATA: TIA versus acute stroke.  Memory loss.

EXAM:
MRI HEAD WITHOUT CONTRAST
MRA HEAD WITHOUT CONTRAST
MRA NECK WITHOUT AND WITH CONTRAST
TECHNIQUE: Multiplanar, multi-echo pulse sequences of the brain and surrounding
structures were acquired without intravenous contrast. Angiographic
images of the Circle of Willis were acquired using MRA technique
without intravenous contrast. Angiographic images of the neck were
acquired using MRA technique without and with intravenous contrast.
Carotid stenosis measurements (when applicable) are obtained
utilizing NASCET criteria, using the distal internal carotid
diameter as the denominator.
CONTRAST:  8mL GADAVIST GADOBUTROL 1 MMOL/ML IV SOLN

[Series 5: DWI · axial · 3.0mm · 0.88mm/px · z∈[-65,+76]mm · 2 of 96 slices shown (1 of 4)]
[im 1/96]
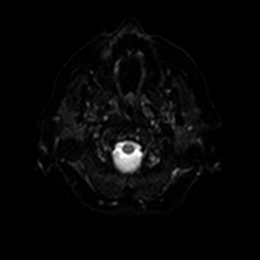
[im 96/96]
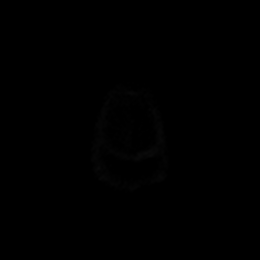

[Series 6: DWI · axial · 3.0mm · 0.88mm/px · 1 of 47 slices shown (2 of 4)]
[im 1/47]
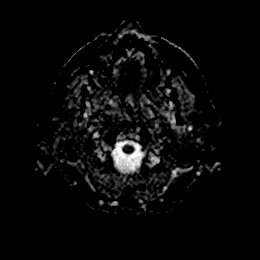

[Series 7: DWI · coronal · 4.0mm · 0.88mm/px · 1 of 64 slices shown (3 of 4)]
[im 1/64]
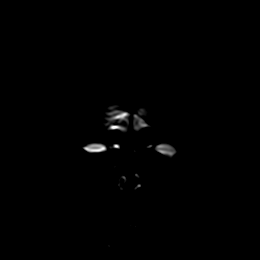

[Series 8: DWI · coronal · 4.0mm · 0.88mm/px · 1 of 32 slices shown (4 of 4)]
[im 1/32]
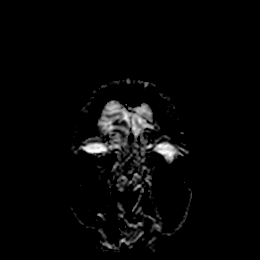

[Series 13: T1 · sagittal · 5.0mm · 0.75mm/px · 1 of 23 slices shown]
[im 1/23]
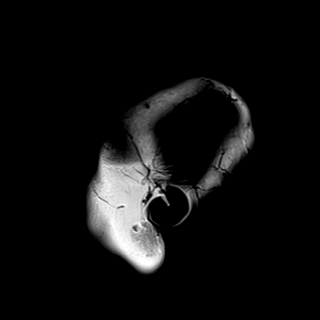

[Series 14: T2 · axial · 5.0mm · 0.72mm/px · 1 of 25 slices shown]
[im 1/25]
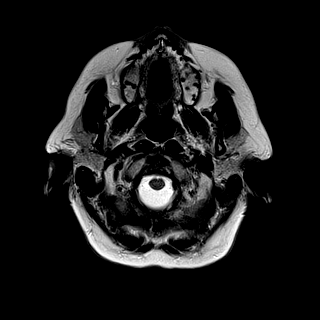

[Series 15: FLAIR · axial · 5.0mm · 0.45mm/px · 1 of 25 slices shown]
[im 1/25]
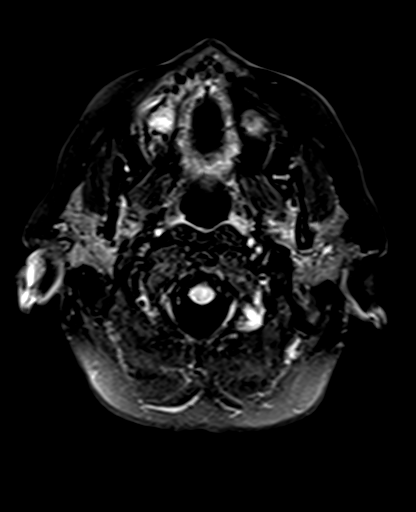

[Series 16: mag_images · axial · 3.0mm · 0.90mm/px · 1 of 60 slices shown]
[im 1/60]
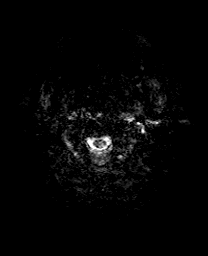

[Series 17: pha_images · axial · 3.0mm · 0.90mm/px · z∈[-80,+88]mm · 2 of 57 slices shown]
[im 1/57]
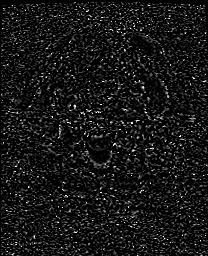
[im 57/57]
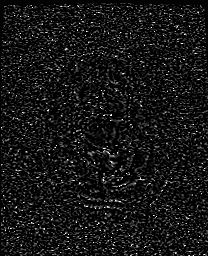

[Series 18: swi_images · axial · 3.0mm · 0.90mm/px · z∈[-80,+97]mm · 2 of 60 slices shown]
[im 1/60]
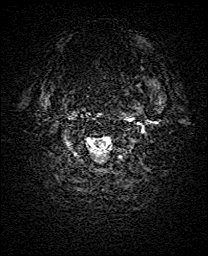
[im 60/60]
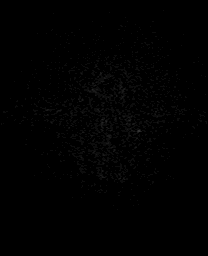

[Series 19: mip_images(sw) · axial · 24.0mm · 0.90mm/px · z∈[-70,+86]mm · 2 of 53 slices shown]
[im 1/53]
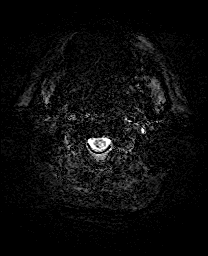
[im 53/53]
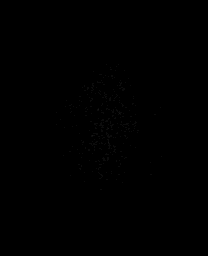

[Series 21: T2 post-contrast · coronal · 5.0mm · 0.72mm/px · 1 of 28 slices shown]
[im 1/28]
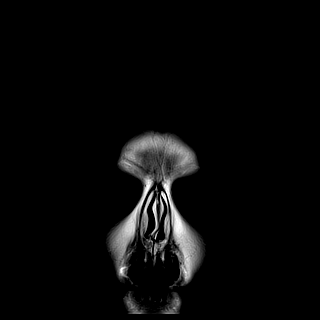

[Series 27: tof_fl3d_tra_iso · axial · 0.6mm · 0.52mm/px · z∈[-178,-60]mm · 6 of 198 slices shown]
[im 1/198]
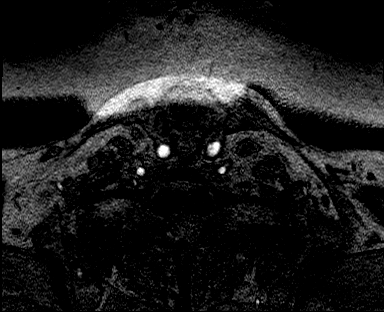
[im 40/198]
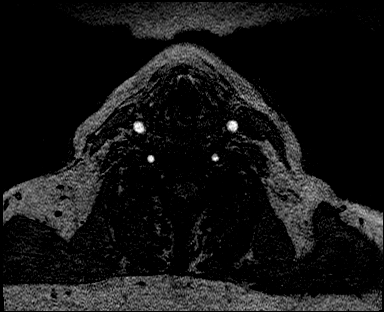
[im 79/198]
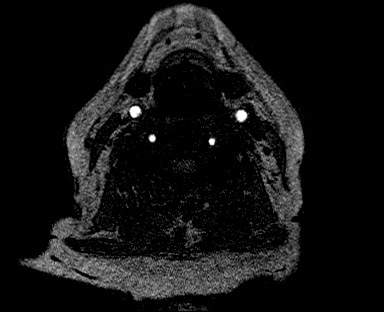
[im 119/198]
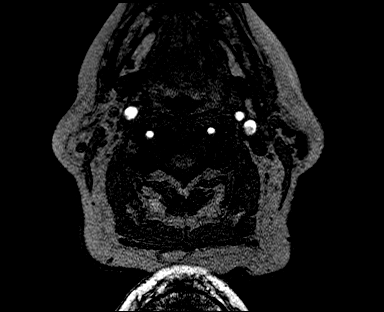
[im 158/198]
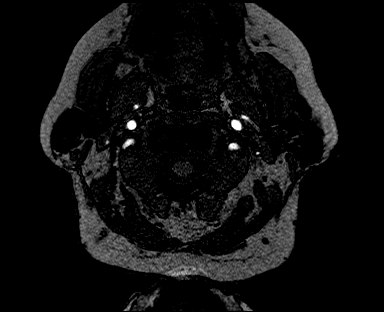
[im 198/198]
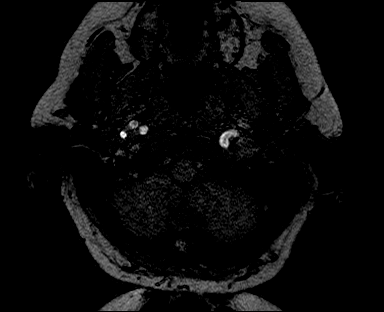

[Series 30: angio_fl3d_cor_pre_ttc=3.0s · coronal · 0.9mm · 0.85mm/px · 2 of 80 slices shown]
[im 1/80]
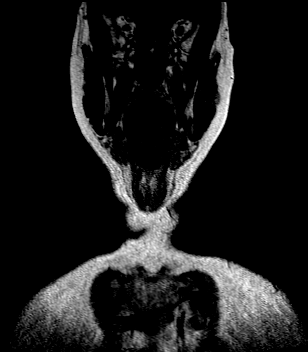
[im 80/80]
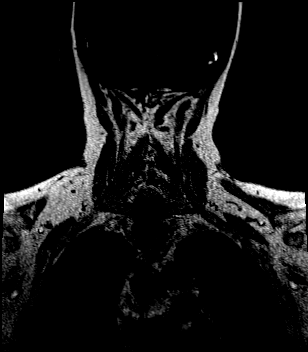

[Series 34: angio_fl3d_cor_post_ttc=3.0s · coronal · 0.9mm · 0.85mm/px · 2 of 80 slices shown (1 of 2)]
[im 1/80]
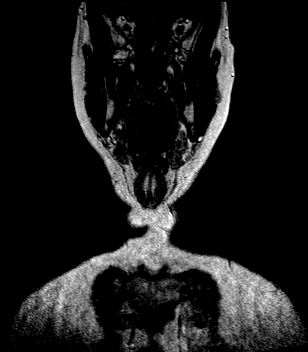
[im 80/80]
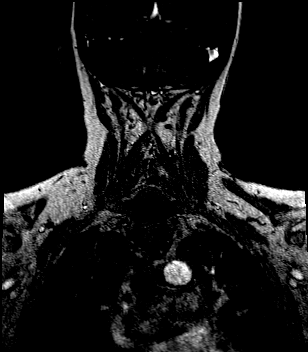

[Series 35: angio_fl3d_cor_post_ttc=3.0s_moco-adv · coronal · 0.9mm · 0.85mm/px · 2 of 80 slices shown (1 of 2)]
[im 1/80]
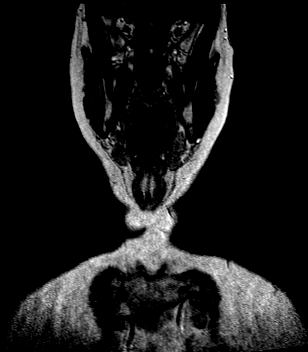
[im 80/80]
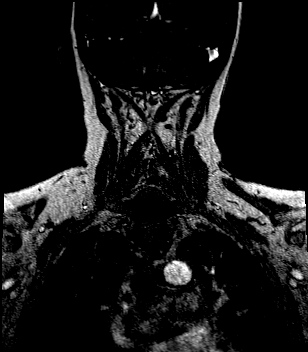

[Series 36: angio_fl3d_cor_post_ttc=3.0s_moco-adv_sub · coronal · 0.9mm · 0.85mm/px · 2 of 80 slices shown (1 of 2)]
[im 1/80]
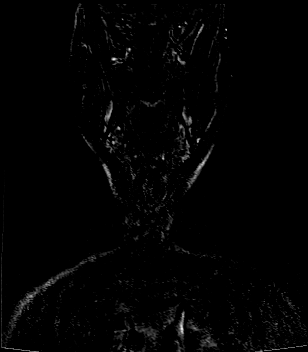
[im 80/80]
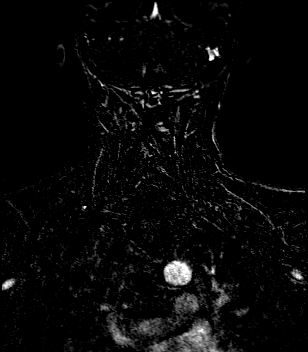

[Series 38: angio_fl3d_cor_post_ttc=3.0s · coronal · 0.9mm · 0.85mm/px · 2 of 80 slices shown (2 of 2)]
[im 1/80]
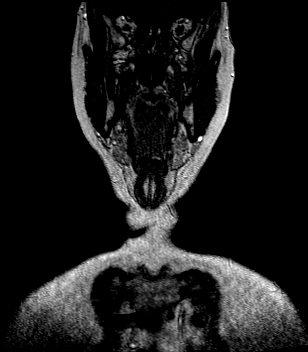
[im 80/80]
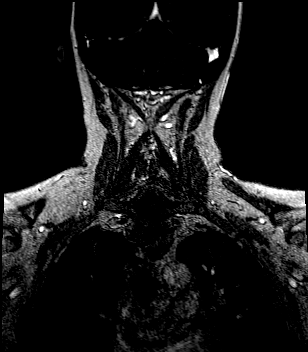

[Series 39: angio_fl3d_cor_post_ttc=3.0s_moco-adv · coronal · 0.9mm · 0.85mm/px · 2 of 80 slices shown (2 of 2)]
[im 1/80]
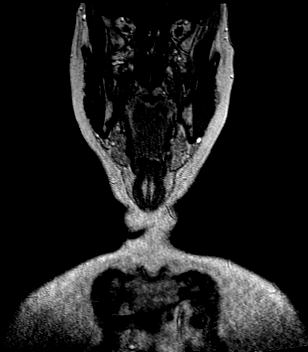
[im 80/80]
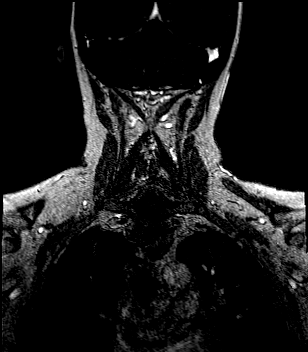

[Series 40: angio_fl3d_cor_post_ttc=3.0s_moco-adv_sub · coronal · 0.9mm · 0.85mm/px · 2 of 80 slices shown (2 of 2)]
[im 1/80]
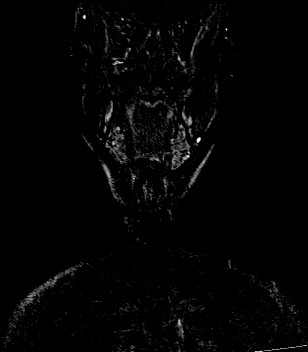
[im 80/80]
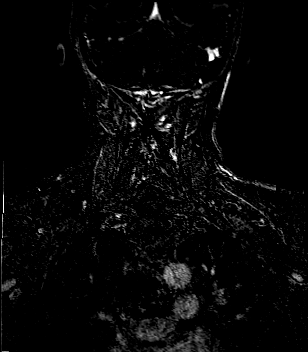

[36 of 48 positions shown; findings below may reference images not displayed]

FINDINGS: MRI HEAD FINDINGS

Brain: Punctate focus of restricted diffusion in the lateral
hippocampal body (series 5, image 66). No acute hemorrhage,
hydrocephalus, mass lesion, abnormal mass effect, or extra-axial
fluid collection. Moderate patchy T2/FLAIR hyperintensities within
the supratentorial and pontine white matter, which are nonspecific
but most likely related to chronic microvascular ischemic disease
given patient age and risk factors (including hypertension and
hyperlipidemia). Mild atrophy with ex vacuo ventricular dilation.
Partially empty sella.

Vascular: See below.

Skull and upper cervical spine: Ill-defined FLAIR hyperintensity and
T1 hypointensity within the right frontal calvarium.

Sinuses/Orbits: Right greater than left frontal sinus mucosal
thickening. Patchy ethmoid air cell mucosal thickening. Left
maxillary sinus and bilateral sphenoid sinus mucosal thickening with
air-fluid levels.

Other: Trace right mastoid fluid.

MRA HEAD FINDINGS

Anterior circulation: Bilateral intracranial ICAs, MCAs, and ACAs
are patent without proximal flow limiting stenosis. There is a large
(approximately 5-6 mm) saccular aneurysm arising from the left MCA
bifurcation.

Posterior circulation: The visualized intradural vertebral arteries,
basilar artery and posterior cerebral arteries are patent without
evidence of a proximal hemodynamically significant stenosis.

MRA NECK FINDINGS

Aortic arch: Great vessel origins are patent.

Carotid system: Patent. Mild bilateral carotid bifurcation narrowing
without greater than 50% stenosis.

Vertebral arteries: Codominant. Patent. No evidence of greater than
50% stenosis.
IMPRESSION: MRI:

1. Punctate focus of restricted diffusion in the lateral hippocampal
body, which may represent transient global amnesia in this patient
with reported memory loss. A small acute infarct is a differential
consideration.
2. Ill-defined FLAIR hyperintensity and T1 hypointensity within the
right frontal calvarium. While nonspecific, osseous metastatic
disease can have this appearance. Postcontrast MRI is recommended to
assess for associated enhancement.
3. Moderate chronic microvascular ischemic disease and mild atrophy.
4. Paranasal sinus mucosal thickening with air-fluid levels, as
described above.

MRA Head:

1. Large (approximately 5-6 mm) left MCA bifurcation saccular
aneurysm. Recommend neurosurgical consultation.
2. No large vessel occlusion or proximal hemodynamically significant
stenosis.

MRA Neck:

No evidence of greater than 50% stenosis.

Findings and recommendations discussed with Dr. MALILO Via
telephone at [DATE].

## 2021-03-25 IMAGING — DX DG CHEST 1V
1 series · 1 of 1 positions shown · non-contrast
Comparison: [DATE]

CLINICAL DATA: Sudden onset confusion

EXAM:
CHEST  1 VIEW

[chest ap]
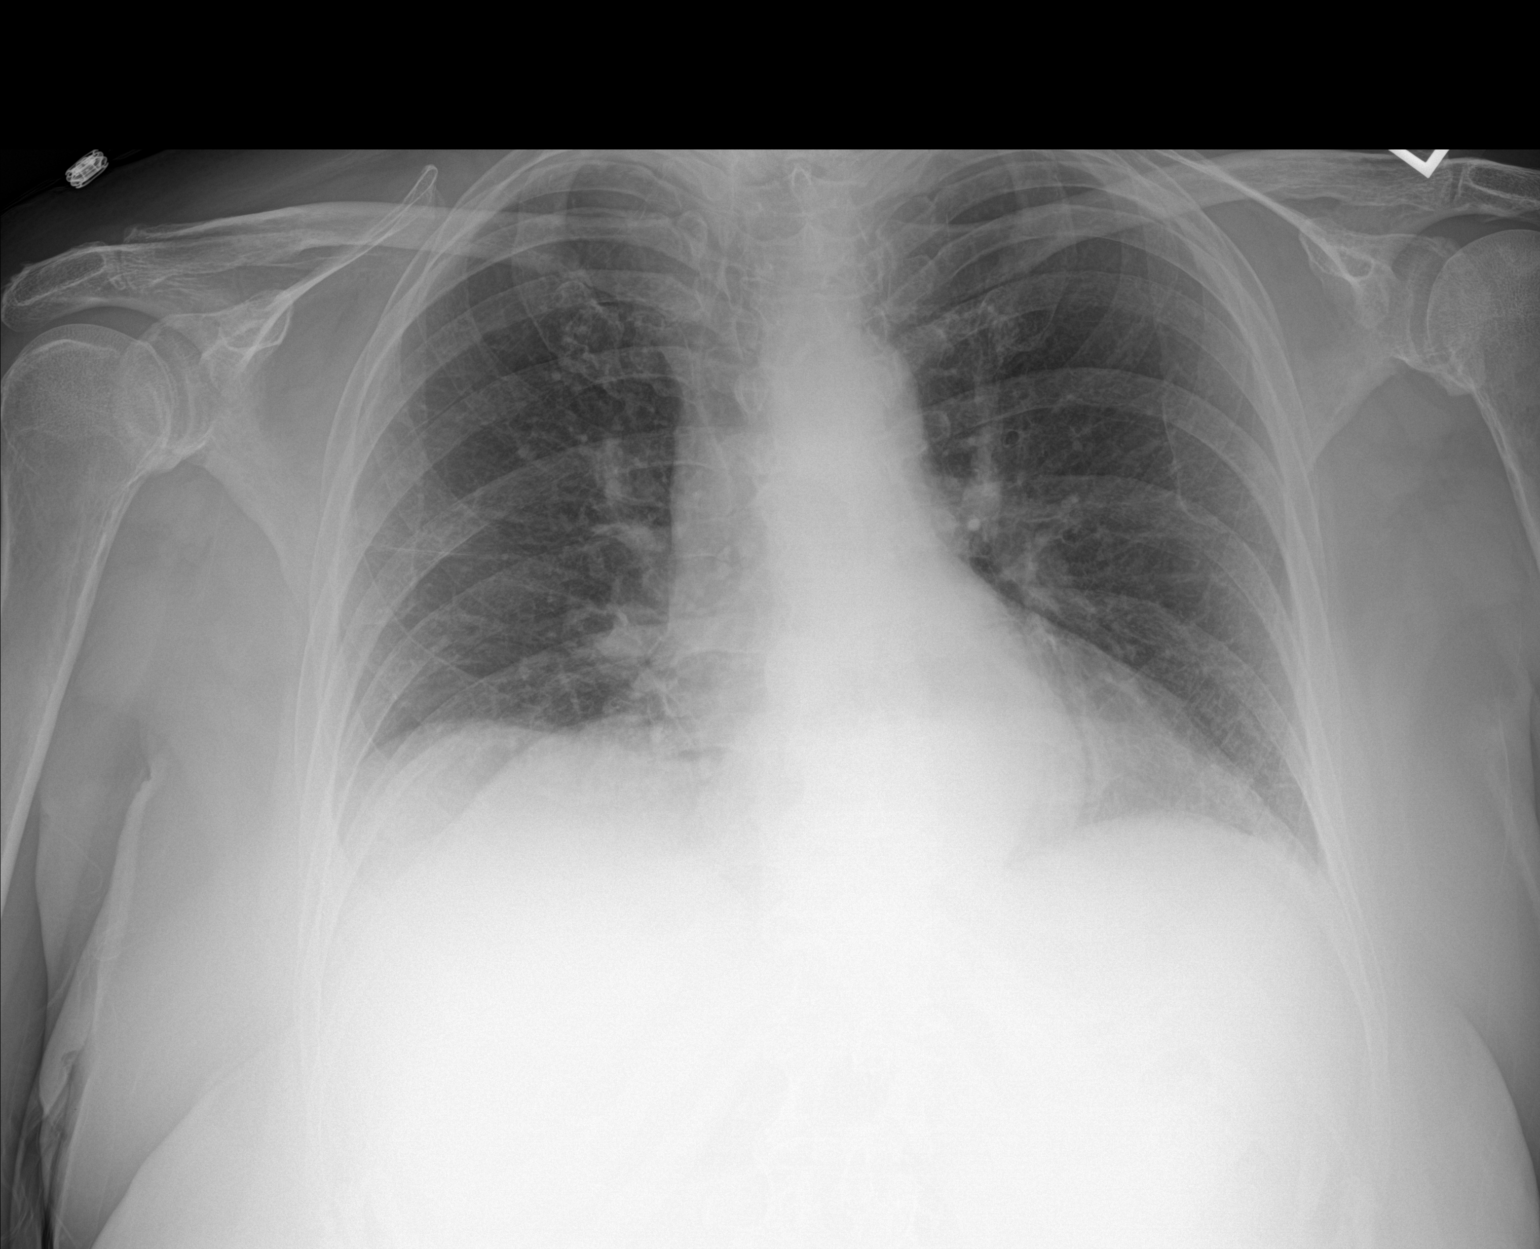

[1 of 1 positions shown; findings below may reference images not displayed]

FINDINGS: Low lung volume. No focal opacity or pleural effusion. Moderate
hiatal hernia.
IMPRESSION: No active disease.  Hiatal hernia

## 2021-03-25 IMAGING — MR MR HEAD W/ CM
2 of 4 series · 18 of 48 positions shown · IV contrast (Gadavist)
Comparison: Same day MRI

CLINICAL DATA: Follow-up from same day MRI head.

EXAM:
MRI HEAD WITH CONTRAST
TECHNIQUE: Multiplanar, multiecho pulse sequences of the brain and surrounding
structures were obtained with intravenous contrast.
CONTRAST:  7.5mL GADAVIST GADOBUTROL 1 MMOL/ML IV SOLN

[Series 6: T2 post-contrast · coronal · 5.0mm · 0.72mm/px · 9 of 29 slices shown]
[im 1/29]
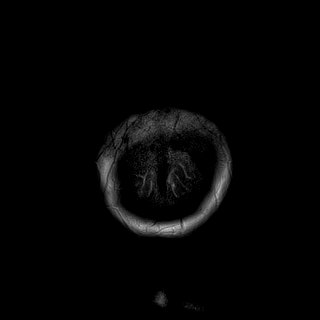
[im 4/29]
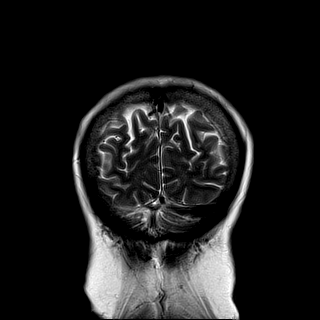
[im 8/29]
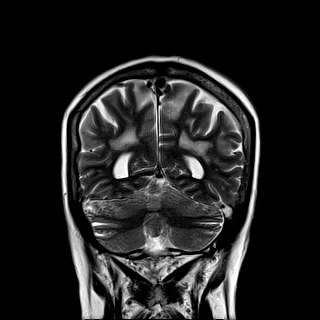
[im 11/29]
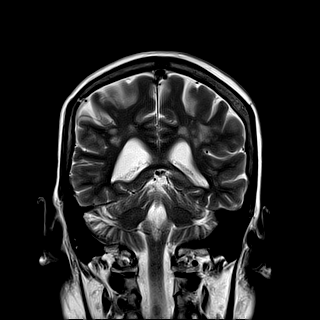
[im 15/29]
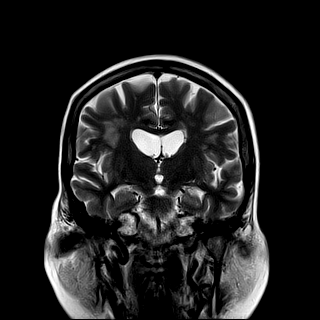
[im 18/29]
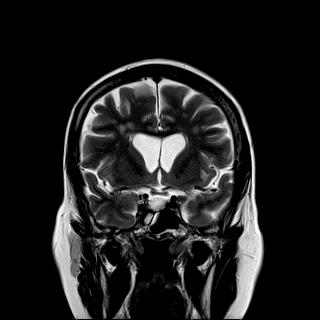
[im 22/29]
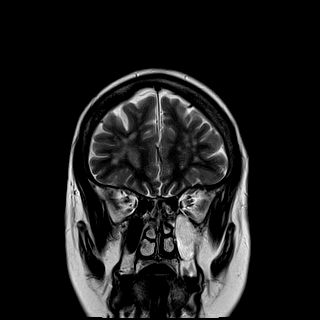
[im 25/29]
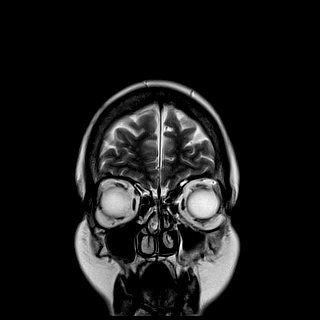
[im 29/29]
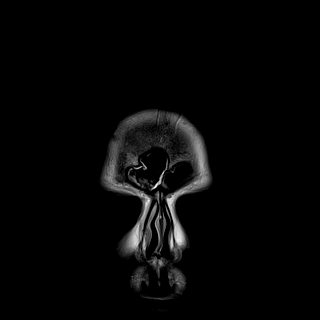

[Series 8: T1 post-contrast · coronal · 5.0mm · 0.34mm/px · 9 of 29 slices shown]
[im 1/29]
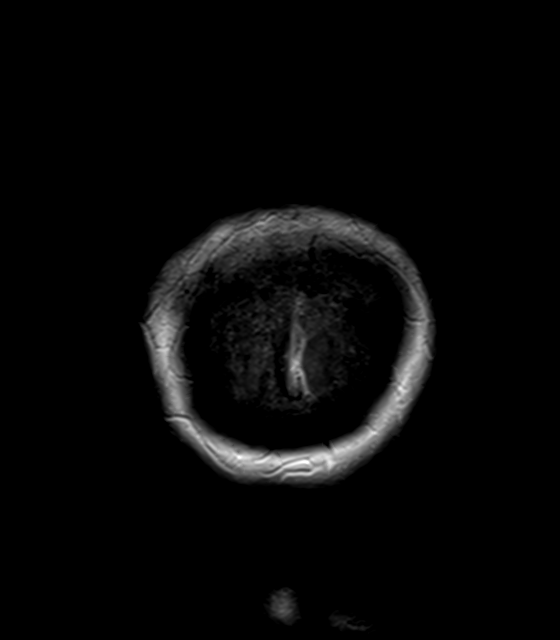
[im 4/29]
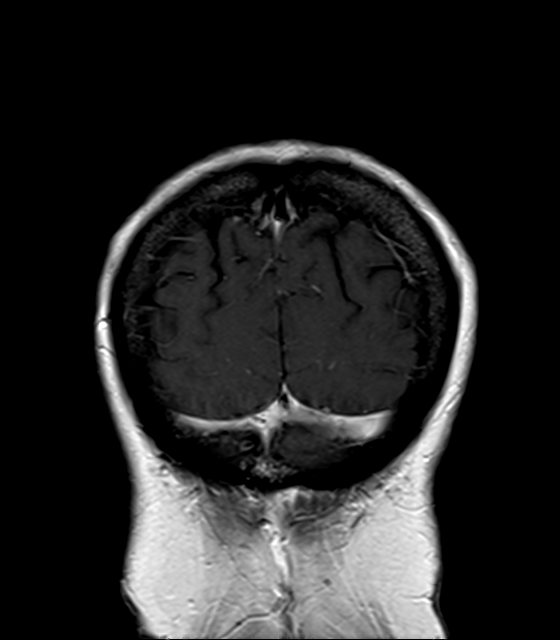
[im 8/29]
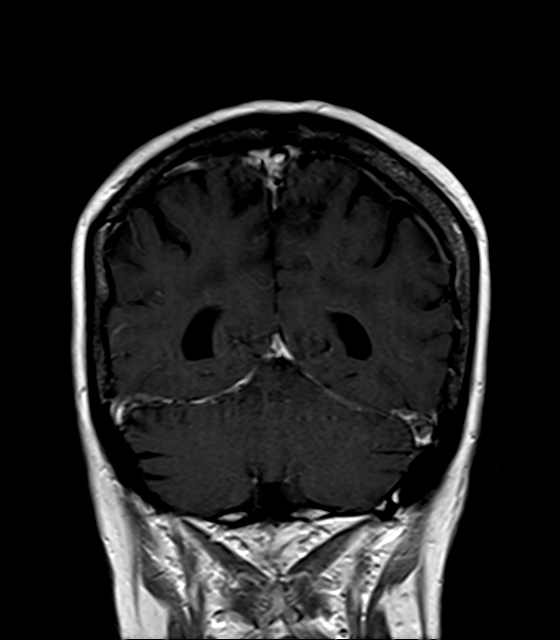
[im 11/29]
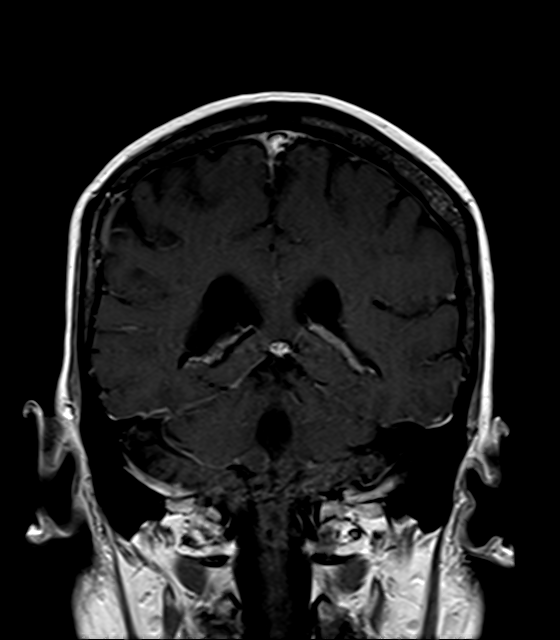
[im 15/29]
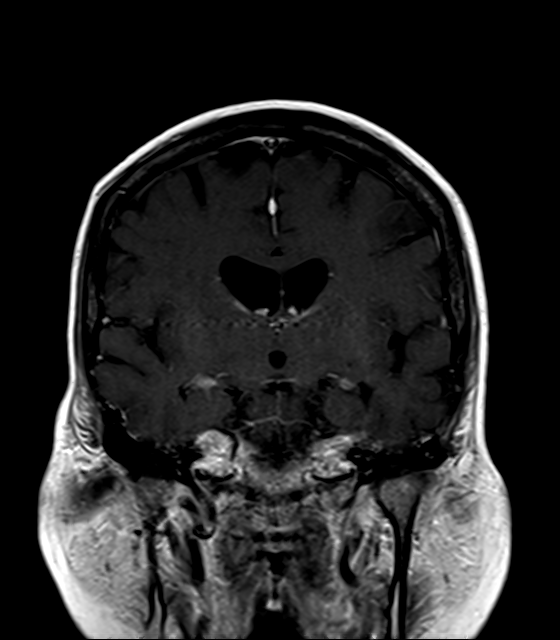
[im 18/29]
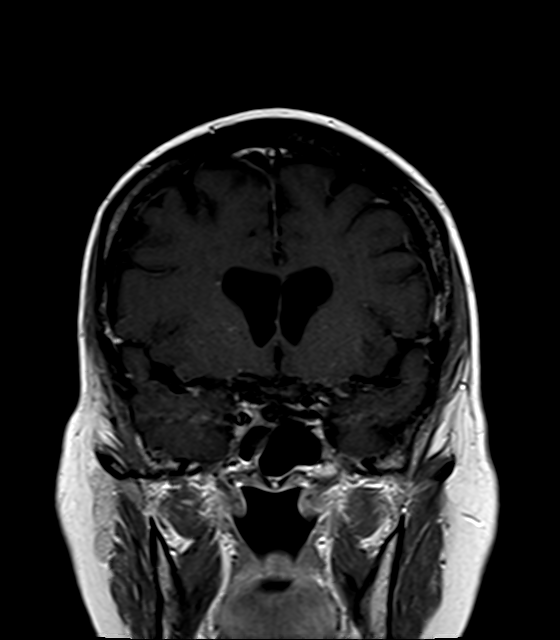
[im 22/29]
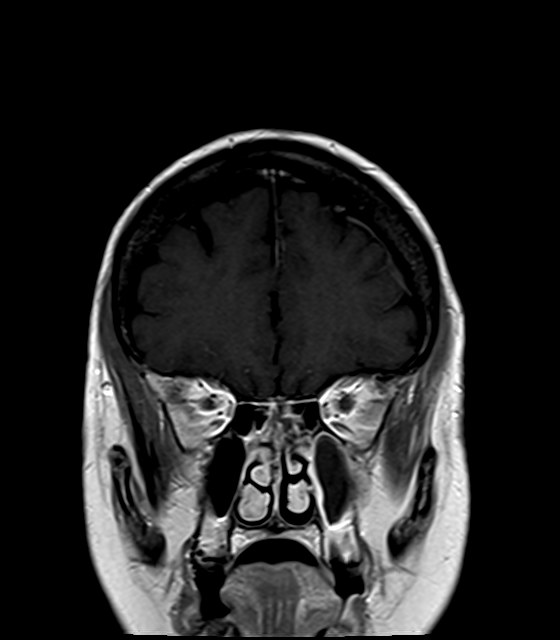
[im 25/29]
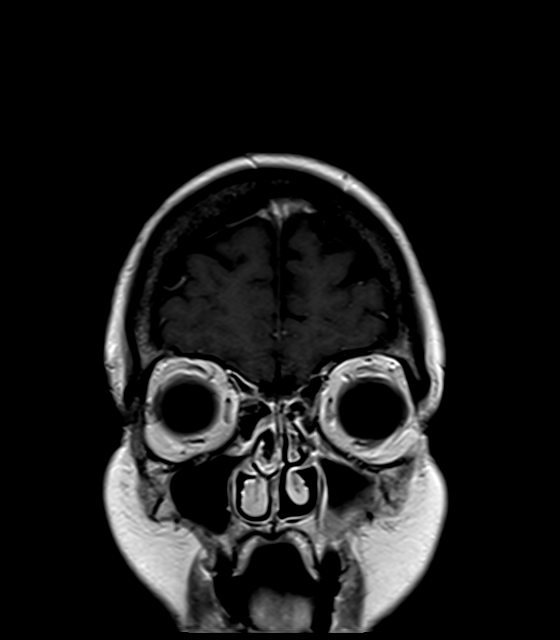
[im 29/29]
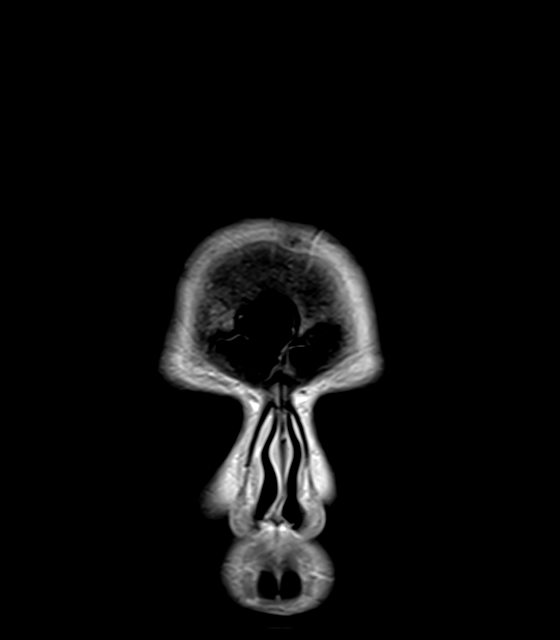

[18 of 48 positions shown; findings below may reference images not displayed]

FINDINGS: Only postcontrast imaging was performed to evaluate an area of
abnormal calvarial signal on the same day MRI. This area only
enhances slightly.

Approximately 7 x 7 mm area of dural-based extra-axial enhancement
along the lateral right temporal convexity (series 8, image 13;
series 7, image 18), which correlates with an area of mineralization
on CT head from yesterday.

No other areas of abnormal enhancement.

Left MCA bifurcation aneurysm, better characterized on same day MRA.
Please see same day MRI head for additional findings.
IMPRESSION: 1. Only post contrast imaging was performed to evaluate an area of
abnormal signal in the right frontal calvarium seen on same day MRI.
This area only enhances slightly, favoring a benign process.
2. Approximately 7 mm area of dural-based, extra-axial enhancement
along the lateral right temporal convexity, which may represent a
meningioma.

## 2021-03-25 IMAGING — MR MR MRA NECK WO/W CM
20 of 28 series · 36 of 48 positions shown · IV contrast (gadavist)
Comparison: CT head [DATE].

CLINICAL DATA: TIA versus acute stroke.  Memory loss.

EXAM:
MRI HEAD WITHOUT CONTRAST
MRA HEAD WITHOUT CONTRAST
MRA NECK WITHOUT AND WITH CONTRAST
TECHNIQUE: Multiplanar, multi-echo pulse sequences of the brain and surrounding
structures were acquired without intravenous contrast. Angiographic
images of the Circle of Willis were acquired using MRA technique
without intravenous contrast. Angiographic images of the neck were
acquired using MRA technique without and with intravenous contrast.
Carotid stenosis measurements (when applicable) are obtained
utilizing NASCET criteria, using the distal internal carotid
diameter as the denominator.
CONTRAST:  8mL GADAVIST GADOBUTROL 1 MMOL/ML IV SOLN

[Series 5: DWI · axial · 3.0mm · 0.88mm/px · z∈[-65,+76]mm · 2 of 96 slices shown (1 of 4)]
[im 1/96]
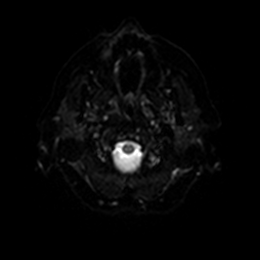
[im 96/96]
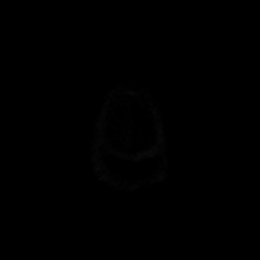

[Series 6: DWI · axial · 3.0mm · 0.88mm/px · 1 of 47 slices shown (2 of 4)]
[im 1/47]
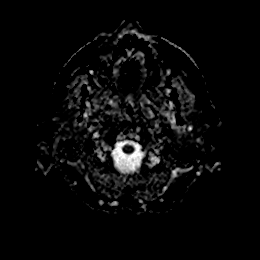

[Series 7: DWI · coronal · 4.0mm · 0.88mm/px · 1 of 64 slices shown (3 of 4)]
[im 1/64]
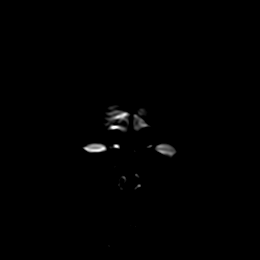

[Series 8: DWI · coronal · 4.0mm · 0.88mm/px · 1 of 32 slices shown (4 of 4)]
[im 1/32]
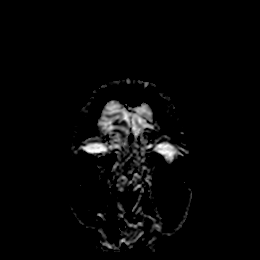

[Series 13: T1 · sagittal · 5.0mm · 0.75mm/px · 1 of 23 slices shown]
[im 1/23]
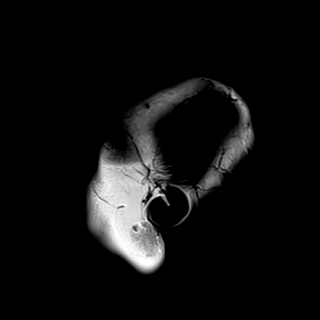

[Series 14: T2 · axial · 5.0mm · 0.72mm/px · 1 of 25 slices shown]
[im 1/25]
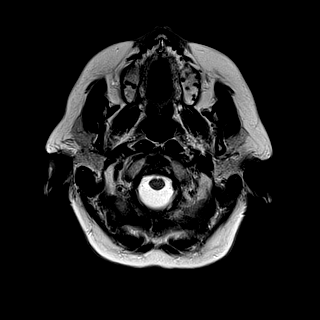

[Series 15: FLAIR · axial · 5.0mm · 0.45mm/px · 1 of 25 slices shown]
[im 1/25]
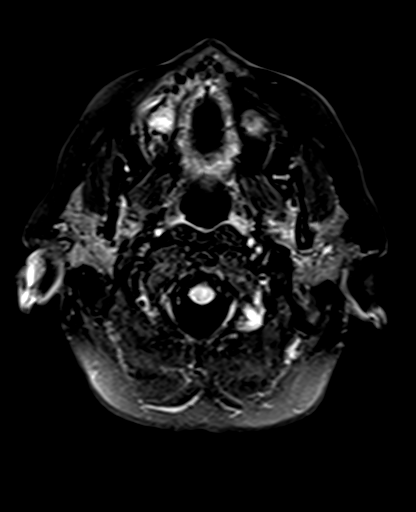

[Series 16: mag_images · axial · 3.0mm · 0.90mm/px · 1 of 60 slices shown]
[im 1/60]
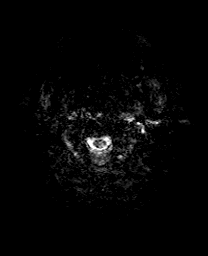

[Series 17: pha_images · axial · 3.0mm · 0.90mm/px · z∈[-80,+88]mm · 2 of 57 slices shown]
[im 1/57]
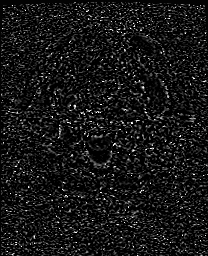
[im 57/57]
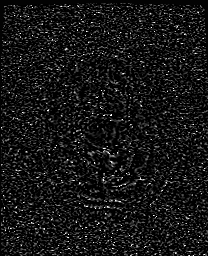

[Series 18: swi_images · axial · 3.0mm · 0.90mm/px · z∈[-80,+97]mm · 2 of 60 slices shown]
[im 1/60]
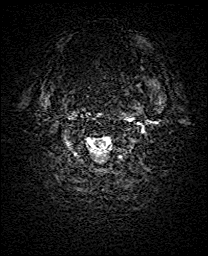
[im 60/60]
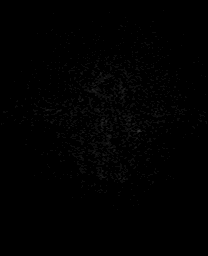

[Series 19: mip_images(sw) · axial · 24.0mm · 0.90mm/px · z∈[-70,+86]mm · 2 of 53 slices shown]
[im 1/53]
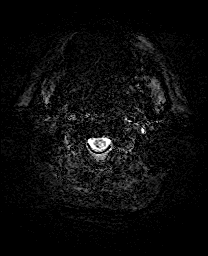
[im 53/53]
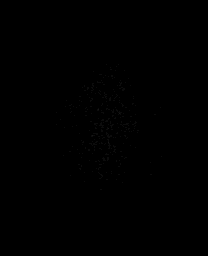

[Series 21: T2 post-contrast · coronal · 5.0mm · 0.72mm/px · 1 of 28 slices shown]
[im 1/28]
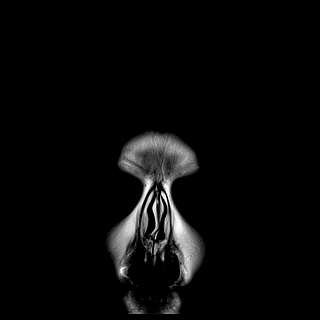

[Series 27: tof_fl3d_tra_iso · axial · 0.6mm · 0.52mm/px · z∈[-178,-60]mm · 6 of 198 slices shown]
[im 1/198]
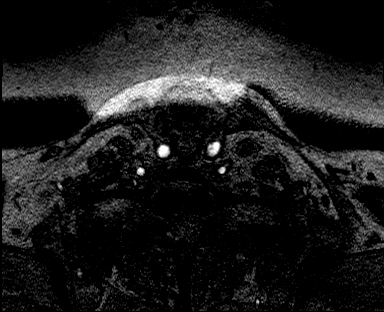
[im 40/198]
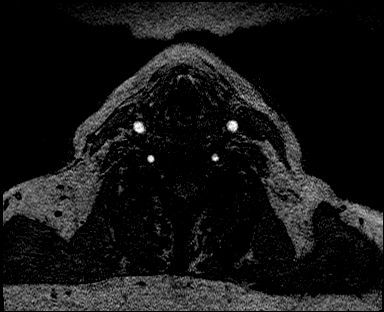
[im 79/198]
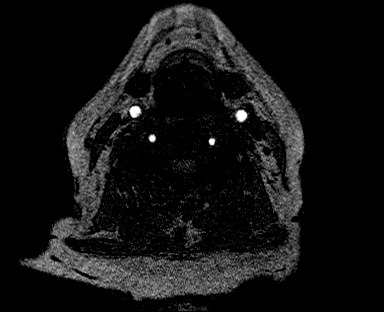
[im 119/198]
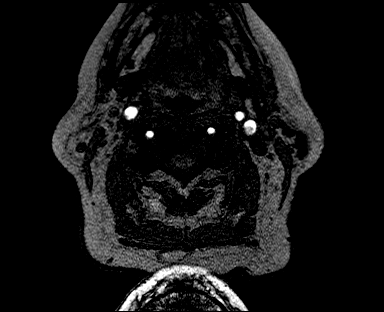
[im 158/198]
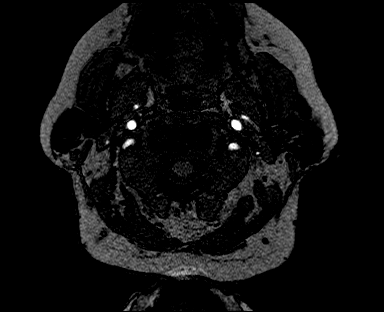
[im 198/198]
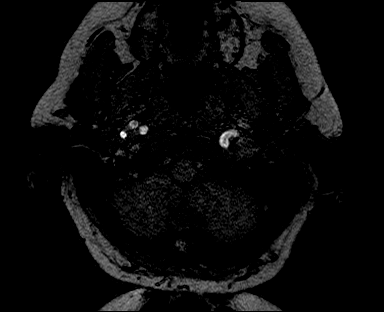

[Series 30: angio_fl3d_cor_pre_ttc=3.0s · coronal · 0.9mm · 0.85mm/px · 2 of 80 slices shown]
[im 1/80]
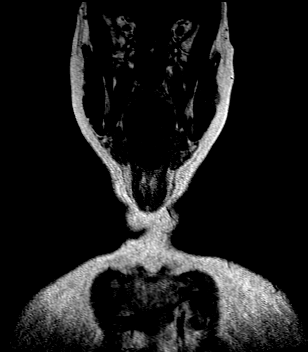
[im 80/80]
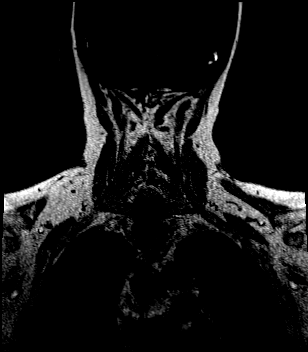

[Series 34: angio_fl3d_cor_post_ttc=3.0s · coronal · 0.9mm · 0.85mm/px · 2 of 80 slices shown (1 of 2)]
[im 1/80]
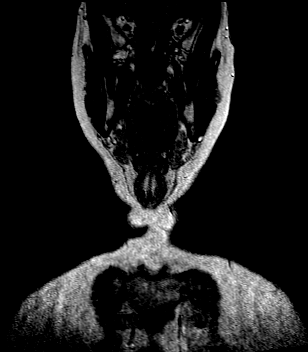
[im 80/80]
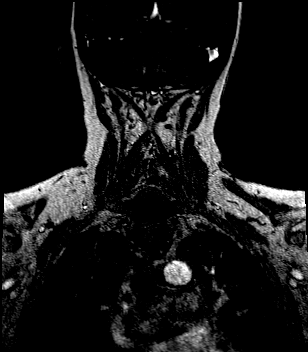

[Series 35: angio_fl3d_cor_post_ttc=3.0s_moco-adv · coronal · 0.9mm · 0.85mm/px · 2 of 80 slices shown (1 of 2)]
[im 1/80]
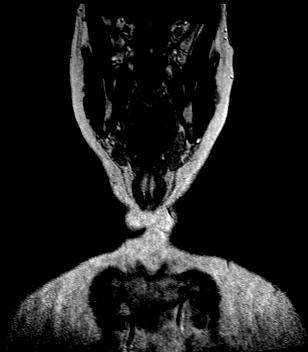
[im 80/80]
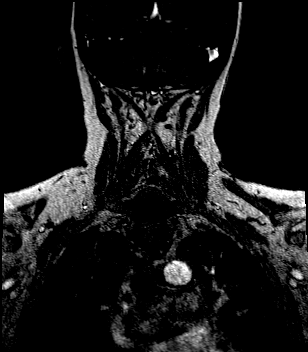

[Series 36: angio_fl3d_cor_post_ttc=3.0s_moco-adv_sub · coronal · 0.9mm · 0.85mm/px · 2 of 80 slices shown (1 of 2)]
[im 1/80]
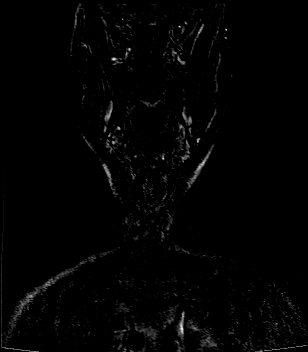
[im 80/80]
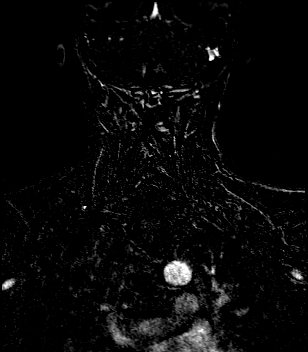

[Series 38: angio_fl3d_cor_post_ttc=3.0s · coronal · 0.9mm · 0.85mm/px · 2 of 80 slices shown (2 of 2)]
[im 1/80]
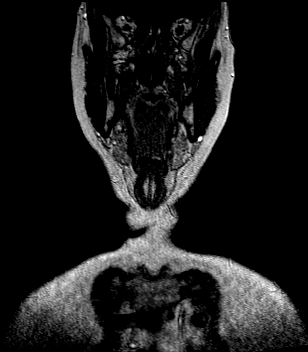
[im 80/80]
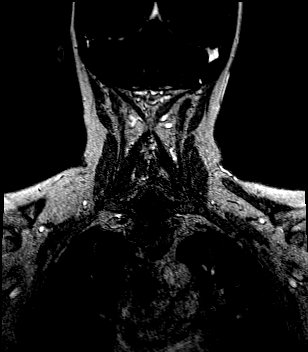

[Series 39: angio_fl3d_cor_post_ttc=3.0s_moco-adv · coronal · 0.9mm · 0.85mm/px · 2 of 80 slices shown (2 of 2)]
[im 1/80]
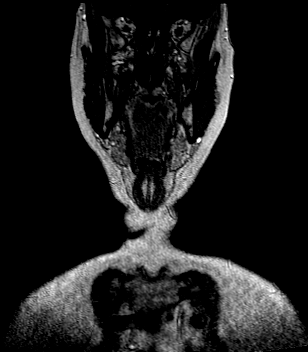
[im 80/80]
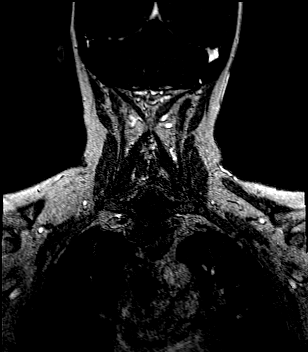

[Series 40: angio_fl3d_cor_post_ttc=3.0s_moco-adv_sub · coronal · 0.9mm · 0.85mm/px · 2 of 80 slices shown (2 of 2)]
[im 1/80]
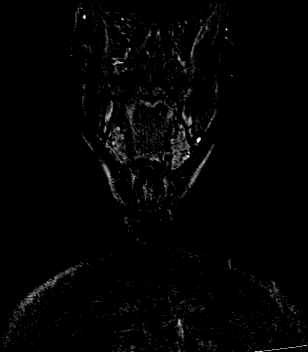
[im 80/80]
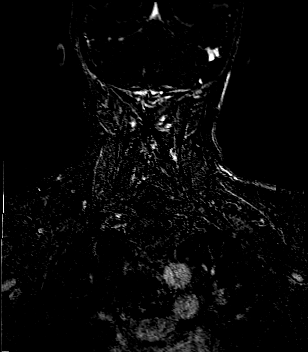

[36 of 48 positions shown; findings below may reference images not displayed]

FINDINGS: MRI HEAD FINDINGS

Brain: Punctate focus of restricted diffusion in the lateral
hippocampal body (series 5, image 66). No acute hemorrhage,
hydrocephalus, mass lesion, abnormal mass effect, or extra-axial
fluid collection. Moderate patchy T2/FLAIR hyperintensities within
the supratentorial and pontine white matter, which are nonspecific
but most likely related to chronic microvascular ischemic disease
given patient age and risk factors (including hypertension and
hyperlipidemia). Mild atrophy with ex vacuo ventricular dilation.
Partially empty sella.

Vascular: See below.

Skull and upper cervical spine: Ill-defined FLAIR hyperintensity and
T1 hypointensity within the right frontal calvarium.

Sinuses/Orbits: Right greater than left frontal sinus mucosal
thickening. Patchy ethmoid air cell mucosal thickening. Left
maxillary sinus and bilateral sphenoid sinus mucosal thickening with
air-fluid levels.

Other: Trace right mastoid fluid.

MRA HEAD FINDINGS

Anterior circulation: Bilateral intracranial ICAs, MCAs, and ACAs
are patent without proximal flow limiting stenosis. There is a large
(approximately 5-6 mm) saccular aneurysm arising from the left MCA
bifurcation.

Posterior circulation: The visualized intradural vertebral arteries,
basilar artery and posterior cerebral arteries are patent without
evidence of a proximal hemodynamically significant stenosis.

MRA NECK FINDINGS

Aortic arch: Great vessel origins are patent.

Carotid system: Patent. Mild bilateral carotid bifurcation narrowing
without greater than 50% stenosis.

Vertebral arteries: Codominant. Patent. No evidence of greater than
50% stenosis.
IMPRESSION: MRI:

1. Punctate focus of restricted diffusion in the lateral hippocampal
body, which may represent transient global amnesia in this patient
with reported memory loss. A small acute infarct is a differential
consideration.
2. Ill-defined FLAIR hyperintensity and T1 hypointensity within the
right frontal calvarium. While nonspecific, osseous metastatic
disease can have this appearance. Postcontrast MRI is recommended to
assess for associated enhancement.
3. Moderate chronic microvascular ischemic disease and mild atrophy.
4. Paranasal sinus mucosal thickening with air-fluid levels, as
described above.

MRA Head:

1. Large (approximately 5-6 mm) left MCA bifurcation saccular
aneurysm. Recommend neurosurgical consultation.
2. No large vessel occlusion or proximal hemodynamically significant
stenosis.

MRA Neck:

No evidence of greater than 50% stenosis.

Findings and recommendations discussed with Dr. MALILO Via
telephone at [DATE].

## 2021-03-25 MED ORDER — MOMETASONE FURO-FORMOTEROL FUM 200-5 MCG/ACT IN AERO
2.0000 | INHALATION_SPRAY | Freq: Two times a day (BID) | RESPIRATORY_TRACT | Status: DC
  Administered 2021-03-25: 2 via RESPIRATORY_TRACT
  Filled 2021-03-25: qty 8.8

## 2021-03-25 MED ORDER — ENOXAPARIN SODIUM 40 MG/0.4ML IJ SOSY
40.0000 mg | PREFILLED_SYRINGE | INTRAMUSCULAR | Status: DC
  Administered 2021-03-25: 40 mg via SUBCUTANEOUS
  Filled 2021-03-25: qty 0.4

## 2021-03-25 MED ORDER — SODIUM CHLORIDE 0.9 % IV SOLN
1.0000 g | INTRAVENOUS | Status: DC
  Administered 2021-03-25 (×2): 1 g via INTRAVENOUS
  Filled 2021-03-25 (×2): qty 10

## 2021-03-25 MED ORDER — ASPIRIN 300 MG RE SUPP: 300.0000 mg | Freq: Every day | RECTAL | Status: DC

## 2021-03-25 MED ORDER — STROKE: EARLY STAGES OF RECOVERY BOOK
Freq: Once | Status: DC
  Filled 2021-03-25: qty 1

## 2021-03-25 MED ORDER — ASPIRIN 325 MG PO TABS
325.0000 mg | ORAL_TABLET | Freq: Every day | ORAL | Status: DC
  Administered 2021-03-25: 325 mg via ORAL
  Filled 2021-03-25: qty 1

## 2021-03-25 MED ORDER — GADOBUTROL 1 MMOL/ML IV SOLN
7.5000 mL | Freq: Once | INTRAVENOUS | Status: AC | PRN
  Administered 2021-03-25: 7.5 mL via INTRAVENOUS

## 2021-03-25 MED ORDER — CEPHALEXIN 500 MG PO CAPS: 500.0000 mg | ORAL_CAPSULE | Freq: Four times a day (QID) | ORAL | 0 refills | Status: AC

## 2021-03-25 MED ORDER — GADOBUTROL 1 MMOL/ML IV SOLN
8.0000 mL | Freq: Once | INTRAVENOUS | Status: AC | PRN
  Administered 2021-03-25: 8 mL via INTRAVENOUS

## 2021-03-25 MED ORDER — ATORVASTATIN CALCIUM 10 MG PO TABS
20.0000 mg | ORAL_TABLET | Freq: Every day | ORAL | Status: DC
  Administered 2021-03-25: 20 mg via ORAL
  Filled 2021-03-25: qty 2

## 2021-03-25 MED ORDER — ACETAMINOPHEN 160 MG/5ML PO SOLN: 650.0000 mg | ORAL | Status: DC | PRN

## 2021-03-25 MED ORDER — ACETAMINOPHEN 325 MG PO TABS: 650.0000 mg | ORAL_TABLET | ORAL | Status: DC | PRN

## 2021-03-25 MED ORDER — ACETAMINOPHEN 650 MG RE SUPP: 650.0000 mg | RECTAL | Status: DC | PRN

## 2021-03-25 MED ORDER — ALBUTEROL SULFATE (2.5 MG/3ML) 0.083% IN NEBU: 2.5000 mg | INHALATION_SOLUTION | Freq: Four times a day (QID) | RESPIRATORY_TRACT | Status: DC | PRN

## 2021-03-25 NOTE — ED Notes (Signed)
Attempted to call report to 3W x2 with no answer

## 2021-03-25 NOTE — Plan of Care (Signed)
  Problem: Education: Goal: Knowledge of disease or condition will improve Outcome: Progressing Goal: Knowledge of secondary prevention will improve Outcome: Progressing   

## 2021-03-25 NOTE — ED Notes (Signed)
Attempted to call report to 3W on [facility] with no answer

## 2021-03-25 NOTE — Progress Notes (Signed)
PT Cancellation Note  Patient Details Name: Ann [name] MRN: [id number] DOB: September 10, 1945   Cancelled Treatment:    Reason Eval/Treat Not Completed: Patient at procedure or test/unavailable.  Pt getting hooked up to EEG.  Per OT she needs to do a path finding activity in the hallway with PT.  PT is unable to do this with EEG.  PT will check back later today or tomorrow as time allows.   Thanks,  Verdene Lennert, PT, DPT  Acute Rehabilitation Ortho Tech Supervisor [phone] pager #[phone] office      Ann [name] 03/25/2021, 6:57 PM

## 2021-03-25 NOTE — Progress Notes (Addendum)
Patient ID: Ann [name], female   DOB: [birth year], 75 y.o.   MRN: [id number] BP (!) 104/59 (BP Location: Left Arm)   Pulse 72   Temp 98.4 F (36.9 C) (Oral)   Resp 14   Ht '5\' 5"'$  (1.651 m)   Wt 75.3 kg   SpO2 96%   BMI 27.62 kg/m  Alert, oriented x4, speech is clear and fluent Symmetric facial movements, sensation Tongue and uvula midline Moving all extremities Incidental MCA aneurysm identified on MRI scan left side. Perfectly ok for discharge. Aneurysm has not ruptured nor any indication of sentinel hemorrhage. Will be evaluated as an outpatient. I have spoken with the patient and explained that this is not an emergent, nor urgent finding. That we will see her in the office. Advise she not be sent home taking both ibuprofen, and meloxicam. Instructed [name] to continue her blood pressure medication.

## 2021-03-25 NOTE — Care Management Obs Status (Signed)
[place] NOTIFICATION   Patient Details  Name: Ann [name] MRN: [id number] Date of Birth: 1946/03/10   Medicare Observation Status Notification Given:  Yes    Pollie Friar, RN 03/25/2021, 4:42 PM

## 2021-03-25 NOTE — Consult Note (Signed)
NEUROLOGY CONSULTATION NOTE   Date of service: March 25, 2021 Patient Name: Ann [name] MRN:  [id number] DOB:  [birth year] Reason for consult: "TGA" Requesting Provider: Etta Quill, DO _ _ _   _ __   _ __ _ _  __ __   _ __   __ _  History of Present Illness  Ann [name] is a 75 y.o. female with PMH significant for HTN, HLD,  chronic bronchitis, who was brought in to the ED with no memory of the events yesterday.  Apparently patient was taking care of her son's dogs. She let them out in the yard and the little dog's head was caught in the fence and patient called her husband from inside the house who had to take apart the fence to free the dog. The dog was bleeding so her husband took the dog to urgent care.He called her from the urgent care but she had no memory of the event even thou she was the one who actually saw the dog being trapped in the fence. She seemed very surprised and kept repeating herself.   The last thing that patient remembers about today is the waiting room at [facility] and then getting a bed and then being sent here.  No EtOh use, quit smoking in 1976, no recreational substances, not on opiods or benzos at home, does not take any sedating medications. No prior history of seizures.  Work up in the ED with [facility] with no acute abnormality.   ROS   Constitutional Denies weight loss, fever and chills.   HEENT Denies changes in vision and hearing.   Respiratory Denies SOB and cough.   CV Denies palpitations and CP   GI Denies abdominal pain, nausea, vomiting and diarrhea.   GU Denies dysuria and urinary frequency.   MSK Denies myalgia and joint pain.   Skin Denies rash and pruritus.   Neurological Denies headache and syncope.   Psychiatric Denies recent changes in mood. Denies anxiety and depression.    Past History   Past Medical History:  Diagnosis Date   Bronchitis, chronic (HCC)    Hyperlipidemia    Hypertension    Pneumonia     Pulmonary nodule    Sepsis ([place])    Past Surgical History:  Procedure Laterality Date   CHOLECYSTECTOMY     Family History  Problem Relation Age of Onset   Heart failure Mother    Osteoporosis Mother    COPD Father    Diabetes Father    Osteoporosis Sister    Osteoporosis Sister    Social History   Socioeconomic History   Marital status: Married    Spouse name: Not on file   Number of children: Not on file   Years of education: Not on file   Highest education level: Not on file  Occupational History   Not on file  Tobacco Use   Smoking status: Former    Packs/day: 0.10    Years: 12.00    Pack years: 1.20    Types: Cigarettes    Start date: 1964    Quit date: 1976    Years since quitting: 46.5   Smokeless tobacco: Never   Tobacco comments:    during smoking years only smoked ocassionally  Vaping Use   Vaping Use: Never used  Substance and Sexual Activity   Alcohol use: Yes   Drug use: Never   Sexual activity: Not on file  Other Topics Concern  Not on file  Social History Narrative   Not on file   Social Determinants of Health   Financial Resource Strain: Not on file  Food Insecurity: Not on file  Transportation Needs: Not on file  Physical Activity: Not on file  Stress: Not on file  Social Connections: Not on file   No Known Allergies  Medications   Medications Prior to Admission  Medication Sig Dispense Refill Last Dose   atorvastatin (LIPITOR) 20 MG tablet Take 20 mg by mouth daily.   03/24/2021   cetirizine (ZYRTEC) 10 MG tablet Take 10 mg by mouth daily.   03/24/2021   Cholecalciferol (VITAMIN D3) 50 MCG (2000 UT) TABS Take 1 tablet by mouth daily.   03/24/2021   fluticasone-salmeterol (ADVAIR HFA) 230-21 MCG/ACT inhaler Inhale 2 puffs into the lungs 2 (two) times daily. 1 each 12 Past Week   ibuprofen (ADVIL) 200 MG tablet Take 400 mg by mouth every 6 (six) hours as needed for moderate pain.   Past Week   lisinopril-hydrochlorothiazide  (ZESTORETIC) 20-25 MG tablet Take 1 tablet by mouth daily.   03/24/2021   meloxicam (MOBIC) 15 MG tablet Take 15 mg by mouth daily.   Past Month   albuterol (VENTOLIN HFA) 108 (90 Base) MCG/ACT inhaler Inhale 1-2 puffs into the lungs every 6 (six) hours as needed for wheezing or shortness of breath. 8 g 3 More than a month   estradiol (ESTRACE) 0.1 MG/GM vaginal cream Place 0.5 g vaginally 2 (two) times a week.   More than a month     Vitals   Vitals:   03/24/21 1919 03/24/21 2352 03/25/21 0448  BP: (!) 147/87 136/77 (!) 140/57  Pulse: 97 82 70  Resp: '15 13 16  '$ Temp: 98.3 F (36.8 C)  97.8 F (36.6 C)  TempSrc: Oral  Oral  SpO2: 94% 98% 96%  Weight: 70.3 kg  75.3 kg  Height: '5\' 5"'$  (1.651 m)  '5\' 5"'$  (1.651 m)     Body mass index is 27.62 kg/m.  Physical Exam   General: Laying comfortably in bed; in no acute distress.  HENT: Normal oropharynx and mucosa. Normal external appearance of ears and nose.  Neck: Supple, no pain or tenderness  CV: No JVD. No peripheral edema.  Pulmonary: Symmetric Chest rise. Normal respiratory effort.  Abdomen: Soft to touch, non-tender.  Ext: No cyanosis, edema, or deformity  Skin: No rash. Normal palpation of skin.   Musculoskeletal: Normal digits and nails by inspection. No clubbing.   Neurologic Examination  Mental status/Cognition: Alert, oriented to self, place, month and year, good attention.  Speech/language: Fluent, comprehension intact, object naming intact, repetition intact.  Cranial nerves:   CN II Pupils equal and reactive to light, no VF deficits    CN III,IV,VI EOM intact, no gaze preference or deviation, no nystagmus    CN V normal sensation in V1, V2, and V3 segments bilaterally    CN VII no asymmetry, no nasolabial fold flattening    CN VIII normal hearing to speech    CN IX & X normal palatal elevation, no uvular deviation    CN XI 5/5 head turn and 5/5 shoulder shrug bilaterally    CN XII midline tongue protrusion    Motor:   Muscle bulk: poor, tone normal, pronator drift none tremor none Mvmt Root Nerve  Muscle Right Left Comments  SA C5/6 Ax Deltoid 5 5   EF C5/6 Mc Biceps 5 5   EE C6/7/8 Rad Triceps 5  5   WF C6/7 Med FCR     WE C7/8 PIN ECU     F Ab C8/T1 U ADM/FDI 5 5   HF L1/2/3 Fem Illopsoas 5 5   KE L2/3/4 Fem Quad 5 5   DF L4/5 D Peron Tib Ant 5 5   PF S1/2 Tibial Grc/Sol 5 5    Reflexes:  Right Left Comments  Pectoralis      Biceps (C5/6) 2 2   Brachioradialis (C5/6) 2 2    Triceps (C6/7) 2 2    Patellar (L3/4) 2 2    Achilles (S1)      Hoffman      Plantar     Jaw jerk    Sensation:  Light touch intact   Pin prick    Temperature    Vibration   Proprioception    Coordination/Complex Motor:  - Finger to Nose intact - Heel to shin intact - Rapid alternating movement intact - Gait: Cautious, Stride length short. Arm swing short. Base width narrow.  Labs   CBC:  Recent Labs  Lab 03/24/21 1939  WBC 9.1  NEUTROABS 6.6  HGB 14.1  HCT 41.6  MCV 88.9  PLT [id number]    Basic Metabolic Panel:  Lab Results  Component Value Date   NA 138 03/24/2021   K 3.9 03/24/2021   CO2 27 03/24/2021   GLUCOSE 112 (H) 03/24/2021   BUN 23 03/24/2021   CREATININE 0.82 03/24/2021   CALCIUM 9.5 03/24/2021   GFRNONAA >60 03/24/2021   GFRAA >60 03/26/2019   Lipid Panel: No results found for: LDLCALC HgbA1c: No results found for: HGBA1C Urine Drug Screen: No results found for: LABOPIA, COCAINSCRNUR, LABBENZ, AMPHETMU, THCU, LABBARB  Alcohol Level No results found for: [place]  CT Head without contrast: CTH was negative for a large hypodensity concerning for a large territory infarct or hyperdensity concerning for an ICH  MR Angio head without contrast and MR angio neck: pending  MRI Brain: Pending  Impression   Siniyah Oleson is a 75 y.o. female with PMH significant for HTN, HLD,  chronic bronchitis, who presented to the ED with episode of anterograde amnesia. Her neurologic  examination is notable for no focal deficit.  The episode is highly concerning for TGA. Likely from a small vessel stroke/TIA involving either the mesial temporal lobe or the C1 area of the dominant hippocampus.  Recommendations  Plan:  - Frequent Neuro checks per stroke unit protocol - Recommend brain imaging with MRI Brain without contrast - Recommend Vascular imaging with MRA Angio Head without contrast and MR angio neck with contrast. - Recommend obtaining TTE - Recommend obtaining Lipid panel with LDL - Please start statin if LDL > 70 - Recommend HbA1c - Antithrombotic - aspirin '81mg'$  daily along with plavix '75mg'$  daily for 21 days, followed by aspirin '81mg'$  daily alone. - Recommend DVT ppx - SBP goal - permissive hypertension first 24 h < 220/110. Held home meds.  - Recommend Telemetry monitoring for arrythmia - Recommend bedside swallow screen prior to PO intake. - Stroke education booklet - Recommend PT/OT/SLP consult  ______________________________________________________________________   Thank you for the opportunity to take part in the care of this patient. If you have any further questions, please contact the neurology consultation attending.  Signed,  [place] Pager Number [phone] _ _ _   _ __   _ __ _ _  __ __   _ __   __ _

## 2021-03-25 NOTE — H&P (Signed)
History and Physical    Ann [name] [id number] DOB: [birth year] DOA: 03/24/2021  PCP: Lois Huxley, PA  Patient coming from: Home  I have personally briefly reviewed patient's old medical records in [place]  Chief Complaint: Memory loss  HPI: Ann [name] is a 75 y.o. female with medical history significant of HTN, HLD.  Pt presents to the ED with amnesia of events today.  Specifically a dog got its head caught in a fence and pt reportedly called for husband who had to take fence apart to free the dog.  Dog was rather severely injured per husband and had to go to vet.  While at the vet, the patients husband called the patient.  Patient apparently had no memory of the event at all.  When patients husband arrived home, patient was confused, unsure of year, asking repetitive questions, and difficulty with word finding.  Pt brought in to ED.  No focal weakness, numbness, facial droop, head trauma, dysuria.   ED Course: In ED, pt with slowly improving mental status though she still has no memory of the events of earlier today with regards to the dog getting head stuck in fence.  CT head neg.  UA with mod LE, 11-20 WBC.  EDP started rocephin.  COVID neg.   Review of Systems: As per HPI, otherwise all review of systems negative.  Past Medical History:  Diagnosis Date   Bronchitis, chronic (HCC)    Hyperlipidemia    Hypertension    Pneumonia    Pulmonary nodule    Sepsis ([place])     Past Surgical History:  Procedure Laterality Date   CHOLECYSTECTOMY       reports that she quit smoking about 46 years ago. Her smoking use included cigarettes. She started smoking about 58 years ago. She has a 1.20 pack-year smoking history. She has never used smokeless tobacco. She reports current alcohol use. She reports that she does not use drugs.  No Known Allergies  Family History  Problem Relation Age of Onset   Heart failure Mother    Osteoporosis  Mother    COPD Father    Diabetes Father    Osteoporosis Sister    Osteoporosis Sister      Prior to Admission medications   Medication Sig Start Date End Date Taking? Authorizing Provider  albuterol (VENTOLIN HFA) 108 (90 Base) MCG/ACT inhaler Inhale 1-2 puffs into the lungs every 6 (six) hours as needed for wheezing or shortness of breath. 11/10/19   Margaretha Seeds, MD  atorvastatin (LIPITOR) 20 MG tablet Take 20 mg by mouth daily. 12/26/18   [provider]  cetirizine (ZYRTEC) 10 MG tablet Take 10 mg by mouth daily.    [provider]  Cholecalciferol (VITAMIN D3) 50 MCG (2000 UT) TABS Take 1 tablet by mouth daily.    [provider]  estradiol (ESTRACE) 0.1 MG/GM vaginal cream Place 0.5 g vaginally 2 (two) times a week. 11/28/18   [provider]  fluticasone-salmeterol (ADVAIR HFA) 230-21 MCG/ACT inhaler Inhale 2 puffs into the lungs 2 (two) times daily. 10/18/20   Margaretha Seeds, MD  ibuprofen (ADVIL) 200 MG tablet Take 400 mg by mouth every 6 (six) hours as needed for moderate pain.    [provider]  lisinopril-hydrochlorothiazide (ZESTORETIC) 20-25 MG tablet Take 1 tablet by mouth daily. 12/26/18   [provider]  meloxicam (MOBIC) 15 MG tablet Take 15 mg by mouth daily. 06/01/20   [provider]    Physical Exam: Vitals:   03/24/21 1919 03/24/21 2352  BP: (!) 147/87 136/77  Pulse: 97 82  Resp: 15 13  Temp: 98.3 F (36.8 C)   TempSrc: Oral   SpO2: 94% 98%  Weight: 70.3 kg   Height: '5\' 5"'$  (1.651 m)     Constitutional: NAD, calm, comfortable Eyes: PERRL, lids and conjunctivae normal ENMT: Mucous membranes are moist. Posterior pharynx clear of any exudate or lesions.Normal dentition.  Neck: normal, supple, no masses, no thyromegaly Respiratory: clear to auscultation bilaterally, no wheezing, no crackles. Normal respiratory effort. No accessory muscle use.  Cardiovascular: Regular rate and rhythm, no  murmurs / rubs / gallops. No extremity edema. 2+ pedal pulses. No carotid bruits.  Abdomen: no tenderness, no masses palpated. No hepatosplenomegaly. Bowel sounds positive.  Musculoskeletal: no clubbing / cyanosis. No joint deformity upper and lower extremities. Good ROM, no contractures. Normal muscle tone.  Skin: no rashes, lesions, ulcers. No induration Neurologic: CN 2-12 grossly intact. Sensation intact, DTR normal. Strength 5/5 in all 4.  Psychiatric: Normal judgment and insight. Alert and oriented x 3. Normal mood.    Labs on Admission: I have personally reviewed following labs and imaging studies  CBC: Recent Labs  Lab 03/24/21 1939  WBC 9.1  NEUTROABS 6.6  HGB 14.1  HCT 41.6  MCV 88.9  PLT [id number]   Basic Metabolic Panel: Recent Labs  Lab 03/24/21 1939  NA 138  K 3.9  CL 102  CO2 27  GLUCOSE 112*  BUN 23  CREATININE 0.82  CALCIUM 9.5   GFR: Estimated Creatinine Clearance: 58.3 mL/min (by C-G formula based on SCr of 0.82 mg/dL). Liver Function Tests: Recent Labs  Lab 03/24/21 1939  AST 22  ALT 30  ALKPHOS 45  BILITOT 1.0  PROT 6.9  ALBUMIN 4.2   No results for input(s): LIPASE, AMYLASE in the last 168 hours. No results for input(s): AMMONIA in the last 168 hours. Coagulation Profile: No results for input(s): INR, PROTIME in the last 168 hours. Cardiac Enzymes: No results for input(s): CKTOTAL, CKMB, CKMBINDEX, TROPONINI in the last 168 hours. BNP (last 3 results) No results for input(s): PROBNP in the last 8760 hours. HbA1C: No results for input(s): HGBA1C in the last 72 hours. CBG: Recent Labs  Lab 03/25/21 0407  GLUCAP 106*   Lipid Profile: No results for input(s): CHOL, HDL, LDLCALC, TRIG, CHOLHDL, LDLDIRECT in the last 72 hours. Thyroid Function Tests: No results for input(s): TSH, T4TOTAL, FREET4, T3FREE, THYROIDAB in the last 72 hours. Anemia Panel: No results for input(s): VITAMINB12, FOLATE, FERRITIN, TIBC, IRON, RETICCTPCT in the last  72 hours. Urine analysis:    Component Value Date/Time   COLORURINE STRAW (A) 03/25/2021 0000   APPEARANCEUR CLEAR 03/25/2021 0000   LABSPEC 1.005 03/25/2021 0000   PHURINE 7.0 03/25/2021 0000   GLUCOSEU NEGATIVE 03/25/2021 0000   HGBUR NEGATIVE 03/25/2021 0000   BILIRUBINUR NEGATIVE 03/25/2021 0000   KETONESUR NEGATIVE 03/25/2021 0000   PROTEINUR NEGATIVE 03/25/2021 0000   NITRITE NEGATIVE 03/25/2021 0000   LEUKOCYTESUR MODERATE (A) 03/25/2021 0000    Radiological Exams on Admission: DG Chest 1 View  Result Date: 03/25/2021 CLINICAL DATA:  Sudden onset confusion EXAM: CHEST  1 VIEW COMPARISON:  03/26/2019 FINDINGS: Low lung volume. No focal opacity or pleural effusion. Moderate hiatal hernia. IMPRESSION: No active disease.  Hiatal hernia Electronically Signed   By: Donavan Foil M.D.   On: 03/25/2021 00:36   CT Head Wo Contrast  Result  Date: 03/24/2021 CLINICAL DATA:  Delirium/confusion EXAM: CT HEAD WITHOUT CONTRAST TECHNIQUE: Contiguous axial images were obtained from the base of the skull through the vertex without intravenous contrast. COMPARISON:  None. FINDINGS: Brain: No evidence of acute infarction, hemorrhage, hydrocephalus, extra-axial collection or mass lesion/mass effect. Subcortical white matter and periventricular small vessel ischemic changes. Vascular: No hyperdense vessel or unexpected calcification. Skull: Normal. Negative for fracture or focal lesion. Sinuses/Orbits: Partial opacification of the right frontal and left maxillary sinus. Mastoid air cells are clear. Other: None. IMPRESSION: No evidence of acute intracranial abnormality. Small vessel ischemic changes. Electronically Signed   By: Julian Hy M.D.   On: 03/24/2021 19:52    EKG: Independently reviewed.  Assessment/Plan Principal Problem:   TGA (transient global amnesia)    TGA - Pt with presentation c/w TGA Psychological traumatic event today as apparent trigger Getting TIA work up / rule out at  recommendation of Dr. Lorrin Goodell TIA pathway MRI/MRA head and neck Tele monitor 2d echo Neuro checks PT/OT/SLP ASA 325 for now Neuro requested pt be admitted over to [facility] HTN - Holding home BP meds until MRI complete HLD - Cont statin ? UTI - EDP started pt on rocephin UCx pending  DVT prophylaxis: Lovenox Code Status: Full Family Communication: Husband at bedside Disposition Plan: Home after work up for [place] / TIA Consults called: Dr. Lorrin Goodell Admission status: Place in obs    Aldean Suddeth, [place] [facility]  How to contact the [facility] Attending or Consulting provider [place] or covering provider during after hours [place], for this patient?  Check the care team in [facility] and look for a) attending/consulting [facility] provider listed and b) the [facility] team listed Log into [email or web address]  Amion Physician Scheduling and messaging for groups and whole hospitals  On call and physician scheduling software for group practices, residents, hospitalists and other medical providers for call, clinic, rotation and shift schedules. On[employer] is a hospital-wide system for scheduling doctors and paging doctors on call. EasyPlot is for scientific plotting and data analysis.  [email or web address]  and use [place]'s universal password to access. If you do not have the password, please contact the hospital operator.  Locate the [facility] provider you are looking for under [facility] and page to a number that you can be directly reached. If you still have difficulty reaching the provider, please page the [facility] (Director on Call) for the Hospitalists listed on amion for assistance.  03/25/2021, 4:22 AM

## 2021-03-25 NOTE — Discharge Summary (Signed)
Physician Discharge Summary  Ann [name] [id number] DOB: 22-Aug-1946 DOA: 03/24/2021  PCP: Lois Huxley, PA  Admit date: 03/24/2021 Discharge date: 03/25/2021  Admitted From: Home Disposition: Home  Recommendations for Outpatient Follow-up:  Follow up with PCP in 1 week Follow up with neurology as needed Follow up wit neurosurgery, Dr. Christella Noa in 2 weeks Please follow up on the following pending results: Urine culture  Home Health: None. Outpatient OT/SLP Equipment/Devices: None  Discharge Condition: Stable CODE STATUS: Full code Diet recommendation: Regular diet   Brief/Interim Summary:  Admission HPI written by Etta Quill, DO   HPI: Ann [name] is a 75 y.o. female with medical history significant of HTN, HLD.   Pt presents to the ED with amnesia of events today.   Specifically a dog got its head caught in a fence and pt reportedly called for husband who had to take fence apart to free the dog.  Dog was rather severely injured per husband and had to go to vet.   While at the vet, the patients husband called the patient.  Patient apparently had no memory of the event at all.   When patients husband arrived home, patient was confused, unsure of year, asking repetitive questions, and difficulty with word finding.   Pt brought in to ED.   No focal weakness, numbness, facial droop, head trauma, dysuria.   Hospital course:  Transient global amnesia Neurology consulted on admission. Confirmed on MRI by evidence of punctate focus of restricted diffusion in the lateral hippocampal body.  Left MCA bifurcation saccular aneurysm 5-6 mm. Neurosurgery consulted and recommended outpatient follow-up.  Likely meningioma Seen on MRI. Patient can follow-up with neurosurgery as mentioned above.  Primary hypertension Continue lisinopril-hydrochlorothiazide.  Dysuria Patient with symptoms of dysuria. Urinalysis not very suggestive of infection.  Empirically started on Ceftriaxone and discharged on Keflex.  Urine culture pending.  Abnormal MRI finding Initial finding concerning for malignancy was re-imaged as post-contrast MRI; the area in question was favored to be benign.  Discharge Diagnoses:  Principal Problem:   TGA (transient global amnesia) Active Problems:   Cerebral aneurysm   UTI (urinary tract infection), uncomplicated    Discharge Instructions  Discharge Instructions     Ambulatory referral to Occupational Therapy   Complete by: As directed    Ambulatory referral to Physical Therapy   Complete by: As directed    Ambulatory referral to Speech Therapy   Complete by: As directed       Allergies as of 03/25/2021   No Known Allergies      Medication List     TAKE these medications    acetaminophen 500 MG tablet Commonly known as: TYLENOL Take 1,000 mg by mouth every 6 (six) hours as needed for mild pain.   Advair HFA 230-21 MCG/ACT inhaler Generic drug: fluticasone-salmeterol Inhale 2 puffs into the lungs 2 (two) times daily.   albuterol 108 (90 Base) MCG/ACT inhaler Commonly known as: VENTOLIN HFA Inhale 1-2 puffs into the lungs every 6 (six) hours as needed for wheezing or shortness of breath.   atorvastatin 20 MG tablet Commonly known as: LIPITOR Take 20 mg by mouth daily.   cephALEXin 500 MG capsule Commonly known as: KEFLEX Take 1 capsule (500 mg total) by mouth 4 (four) times daily for 4 days.   cetirizine 10 MG tablet Commonly known as: ZYRTEC Take 10 mg by mouth daily.   estradiol 0.1 MG/GM vaginal cream Commonly known as: ESTRACE Place 0.5 g  vaginally 2 (two) times a week.   ibuprofen 200 MG tablet Commonly known as: ADVIL Take 400 mg by mouth every 6 (six) hours as needed for moderate pain.   lisinopril-hydrochlorothiazide 20-25 MG tablet Commonly known as: ZESTORETIC Take 1 tablet by mouth daily.   meloxicam 15 MG tablet Commonly known as: MOBIC Take 15 mg by mouth  daily.   Vitamin D3 50 MCG (2000 UT) Tabs Take 1 tablet by mouth daily.        Follow-up Information     Ashok Pall, MD Follow up in 2 week(s).   Specialty: Neurosurgery Why: please call our office to schedule an appointment. Contact information: [address] [place] [address] [phone]         [place] Follow up.   Specialty: Rehabilitation Why: Please call the Outpatient rehab on Monday 7/25 to schedule an appointment Contact information: [address] [place]               No Known Allergies  Consultations: Neurology Neurosurgery   Procedures/Studies: DG Chest 1 View  Result Date: 03/25/2021 CLINICAL DATA:  Sudden onset confusion EXAM: CHEST  1 VIEW COMPARISON:  03/26/2019 FINDINGS: Low lung volume. No focal opacity or pleural effusion. Moderate hiatal hernia. IMPRESSION: No active disease.  Hiatal hernia Electronically Signed   By: Donavan Foil M.D.   On: 03/25/2021 00:36   CT Head Wo Contrast  Result Date: 03/24/2021 CLINICAL DATA:  Delirium/confusion EXAM: CT HEAD WITHOUT CONTRAST TECHNIQUE: Contiguous axial images were obtained from the base of the skull through the vertex without intravenous contrast. COMPARISON:  None. FINDINGS: Brain: No evidence of acute infarction, hemorrhage, hydrocephalus, extra-axial collection or mass lesion/mass effect. Subcortical white matter and periventricular small vessel ischemic changes. Vascular: No hyperdense vessel or unexpected calcification. Skull: Normal. Negative for fracture or focal lesion. Sinuses/Orbits: Partial opacification of the right frontal and left maxillary sinus. Mastoid air cells are clear. Other: None. IMPRESSION: No evidence of acute intracranial abnormality. Small vessel ischemic changes. Electronically Signed   By: Julian Hy M.D.   On: 03/24/2021 19:52    MR ANGIO HEAD WO CONTRAST  Result Date: 03/25/2021 CLINICAL DATA:  TIA versus acute stroke.  Memory loss. EXAM: MRI HEAD WITHOUT CONTRAST MRA HEAD WITHOUT CONTRAST MRA NECK WITHOUT AND WITH CONTRAST TECHNIQUE: Multiplanar, multi-echo pulse sequences of the brain and surrounding structures were acquired without intravenous contrast. Angiographic images of the Circle of Willis were acquired using MRA technique without intravenous contrast. Angiographic images of the neck were acquired using MRA technique without and with intravenous contrast. Carotid stenosis measurements (when applicable) are obtained utilizing NASCET criteria, using the distal internal carotid diameter as the denominator. CONTRAST:  81m GADAVIST GADOBUTROL 1 MMOL/ML IV SOLN COMPARISON:  CT head 03/24/2021. FINDINGS: MRI HEAD FINDINGS Brain: Punctate focus of restricted diffusion in the lateral hippocampal body (series 5, image 66). No acute hemorrhage, hydrocephalus, mass lesion, abnormal mass effect, or extra-axial fluid collection. Moderate patchy T2/FLAIR hyperintensities within the supratentorial and pontine white matter, which are nonspecific but most likely related to chronic microvascular ischemic disease given patient age and risk factors (including hypertension and hyperlipidemia). Mild atrophy with ex vacuo ventricular dilation. Partially empty sella. Vascular: See below. Skull and upper cervical spine: Ill-defined FLAIR hyperintensity and T1 hypointensity within the right frontal calvarium. Sinuses/Orbits: Right greater than left frontal sinus mucosal thickening. Patchy ethmoid air cell mucosal thickening. Left maxillary sinus and bilateral  sphenoid sinus mucosal thickening with air-fluid levels. Other: Trace right mastoid fluid. MRA HEAD FINDINGS Anterior circulation: Bilateral intracranial ICAs, MCAs, and ACAs are patent without proximal flow limiting stenosis. There is a large (approximately 5-6 mm) saccular aneurysm arising from  the left MCA bifurcation. Posterior circulation: The visualized intradural vertebral arteries, basilar artery and posterior cerebral arteries are patent without evidence of a proximal hemodynamically significant stenosis. MRA NECK FINDINGS Aortic arch: Great vessel origins are patent. Carotid system: Patent. Mild bilateral carotid bifurcation narrowing without greater than 50% stenosis. Vertebral arteries: Codominant. Patent. No evidence of greater than 50% stenosis. IMPRESSION: MRI: 1. Punctate focus of restricted diffusion in the lateral hippocampal body, which may represent transient global amnesia in this patient with reported memory loss. A small acute infarct is a differential consideration. 2. Ill-defined FLAIR hyperintensity and T1 hypointensity within the right frontal calvarium. While nonspecific, osseous metastatic disease can have this appearance. Postcontrast MRI is recommended to assess for associated enhancement. 3. Moderate chronic microvascular ischemic disease and mild atrophy. 4. Paranasal sinus mucosal thickening with air-fluid levels, as described above. MRA Head: 1. Large (approximately 5-6 mm) left MCA bifurcation saccular aneurysm. Recommend neurosurgical consultation. 2. No large vessel occlusion or proximal hemodynamically significant stenosis. MRA Neck: No evidence of greater than 50% stenosis. Findings and recommendations discussed with Dr. Cordelia Poche Via telephone at 9:52 AM. Electronically Signed   By: Margaretha Sheffield MD   On: 03/25/2021 09:54   MR ANGIO NECK W WO CONTRAST  Result Date: 03/25/2021 CLINICAL DATA:  TIA versus acute stroke.  Memory loss. EXAM: MRI HEAD WITHOUT CONTRAST MRA HEAD WITHOUT CONTRAST MRA NECK WITHOUT AND WITH CONTRAST TECHNIQUE: Multiplanar, multi-echo pulse sequences of the brain and surrounding structures were acquired without intravenous contrast. Angiographic images of the Circle of Willis were acquired using MRA technique without intravenous  contrast. Angiographic images of the neck were acquired using MRA technique without and with intravenous contrast. Carotid stenosis measurements (when applicable) are obtained utilizing NASCET criteria, using the distal internal carotid diameter as the denominator. CONTRAST:  66m GADAVIST GADOBUTROL 1 MMOL/ML IV SOLN COMPARISON:  CT head 03/24/2021. FINDINGS: MRI HEAD FINDINGS Brain: Punctate focus of restricted diffusion in the lateral hippocampal body (series 5, image 66). No acute hemorrhage, hydrocephalus, mass lesion, abnormal mass effect, or extra-axial fluid collection. Moderate patchy T2/FLAIR hyperintensities within the supratentorial and pontine white matter, which are nonspecific but most likely related to chronic microvascular ischemic disease given patient age and risk factors (including hypertension and hyperlipidemia). Mild atrophy with ex vacuo ventricular dilation. Partially empty sella. Vascular: See below. Skull and upper cervical spine: Ill-defined FLAIR hyperintensity and T1 hypointensity within the right frontal calvarium. Sinuses/Orbits: Right greater than left frontal sinus mucosal thickening. Patchy ethmoid air cell mucosal thickening. Left maxillary sinus and bilateral sphenoid sinus mucosal thickening with air-fluid levels. Other: Trace right mastoid fluid. MRA HEAD FINDINGS Anterior circulation: Bilateral intracranial ICAs, MCAs, and ACAs are patent without proximal flow limiting stenosis. There is a large (approximately 5-6 mm) saccular aneurysm arising from the left MCA bifurcation. Posterior circulation: The visualized intradural vertebral arteries, basilar artery and posterior cerebral arteries are patent without evidence of a proximal hemodynamically significant stenosis. MRA NECK FINDINGS Aortic arch: Great vessel origins are patent. Carotid system: Patent. Mild bilateral carotid bifurcation narrowing without greater than 50% stenosis. Vertebral arteries: Codominant. Patent. No  evidence of greater than 50% stenosis. IMPRESSION: MRI: 1. Punctate focus of restricted diffusion in the lateral hippocampal body, which may represent transient global  amnesia in this patient with reported memory loss. A small acute infarct is a differential consideration. 2. Ill-defined FLAIR hyperintensity and T1 hypointensity within the right frontal calvarium. While nonspecific, osseous metastatic disease can have this appearance. Postcontrast MRI is recommended to assess for associated enhancement. 3. Moderate chronic microvascular ischemic disease and mild atrophy. 4. Paranasal sinus mucosal thickening with air-fluid levels, as described above. MRA Head: 1. Large (approximately 5-6 mm) left MCA bifurcation saccular aneurysm. Recommend neurosurgical consultation. 2. No large vessel occlusion or proximal hemodynamically significant stenosis. MRA Neck: No evidence of greater than 50% stenosis. Findings and recommendations discussed with Dr. Cordelia Poche Via telephone at 9:52 AM. Electronically Signed   By: Margaretha Sheffield MD   On: 03/25/2021 09:54   MR BRAIN WO CONTRAST  Result Date: 03/25/2021 CLINICAL DATA:  TIA versus acute stroke.  Memory loss. EXAM: MRI HEAD WITHOUT CONTRAST MRA HEAD WITHOUT CONTRAST MRA NECK WITHOUT AND WITH CONTRAST TECHNIQUE: Multiplanar, multi-echo pulse sequences of the brain and surrounding structures were acquired without intravenous contrast. Angiographic images of the Circle of Willis were acquired using MRA technique without intravenous contrast. Angiographic images of the neck were acquired using MRA technique without and with intravenous contrast. Carotid stenosis measurements (when applicable) are obtained utilizing NASCET criteria, using the distal internal carotid diameter as the denominator. CONTRAST:  13m GADAVIST GADOBUTROL 1 MMOL/ML IV SOLN COMPARISON:  CT head 03/24/2021. FINDINGS: MRI HEAD FINDINGS Brain: Punctate focus of restricted diffusion in the lateral  hippocampal body (series 5, image 66). No acute hemorrhage, hydrocephalus, mass lesion, abnormal mass effect, or extra-axial fluid collection. Moderate patchy T2/FLAIR hyperintensities within the supratentorial and pontine white matter, which are nonspecific but most likely related to chronic microvascular ischemic disease given patient age and risk factors (including hypertension and hyperlipidemia). Mild atrophy with ex vacuo ventricular dilation. Partially empty sella. Vascular: See below. Skull and upper cervical spine: Ill-defined FLAIR hyperintensity and T1 hypointensity within the right frontal calvarium. Sinuses/Orbits: Right greater than left frontal sinus mucosal thickening. Patchy ethmoid air cell mucosal thickening. Left maxillary sinus and bilateral sphenoid sinus mucosal thickening with air-fluid levels. Other: Trace right mastoid fluid. MRA HEAD FINDINGS Anterior circulation: Bilateral intracranial ICAs, MCAs, and ACAs are patent without proximal flow limiting stenosis. There is a large (approximately 5-6 mm) saccular aneurysm arising from the left MCA bifurcation. Posterior circulation: The visualized intradural vertebral arteries, basilar artery and posterior cerebral arteries are patent without evidence of a proximal hemodynamically significant stenosis. MRA NECK FINDINGS Aortic arch: Great vessel origins are patent. Carotid system: Patent. Mild bilateral carotid bifurcation narrowing without greater than 50% stenosis. Vertebral arteries: Codominant. Patent. No evidence of greater than 50% stenosis. IMPRESSION: MRI: 1. Punctate focus of restricted diffusion in the lateral hippocampal body, which may represent transient global amnesia in this patient with reported memory loss. A small acute infarct is a differential consideration. 2. Ill-defined FLAIR hyperintensity and T1 hypointensity within the right frontal calvarium. While nonspecific, osseous metastatic disease can have this appearance.  Postcontrast MRI is recommended to assess for associated enhancement. 3. Moderate chronic microvascular ischemic disease and mild atrophy. 4. Paranasal sinus mucosal thickening with air-fluid levels, as described above. MRA Head: 1. Large (approximately 5-6 mm) left MCA bifurcation saccular aneurysm. Recommend neurosurgical consultation. 2. No large vessel occlusion or proximal hemodynamically significant stenosis. MRA Neck: No evidence of greater than 50% stenosis. Findings and recommendations discussed with Dr. RCordelia PocheVia telephone at 9:52 AM. Electronically Signed   By: FMargaretha SheffieldMD  On: 03/25/2021 09:54   EEG adult  Result Date: 03/25/2021 Lora Havens, MD     03/25/2021 11:28 AM Patient Name: Ann [name] MRN: [id number] Epilepsy Attending: Lora Havens Referring Physician/Provider: Dr Mitzi Hansen Date: 03/25/2021 Duration: 23.37 mins Patient history: 75 y.o. female with PMH significant for HTN, HLD,  chronic bronchitis, who presented to the ED with episode of anterograde amnesia. EEG to evaluate for seizure Level of alertness: Awake, asleep AEDs during EEG study: None Technical aspects: This EEG study was done with scalp electrodes positioned according to the 10-20 International system of electrode placement. Electrical activity was acquired at a sampling rate of '500Hz'$  and reviewed with a high frequency filter of '70Hz'$  and a low frequency filter of '1Hz'$ . EEG data were recorded continuously and digitally stored. Description: The posterior dominant rhythm consists of 9-10 Hz activity of moderate voltage (25-35 uV) seen predominantly in posterior head regions, symmetric and reactive to eye opening and eye closing. Sleep was characterized by vertex waves, sleep spindles (12 to 14 Hz), maximal frontocentral region. Hyperventilation and photic stimulation were not performed.   IMPRESSION: This study is within normal limits. No seizures or epileptiform discharges were seen  throughout the recording. Priyanka Barbra Sarks     Subjective: No concerns. Feeling more like herself. Memory is intact except for the traumatic event.  Discharge Exam: Vitals:   03/25/21 1142 03/25/21 1536  BP: 125/66 (!) 122/52  Pulse: 70 74  Resp: 16 20  Temp: 98.7 F (37.1 C) 98.5 F (36.9 C)  SpO2: 95% 92%   Vitals:   03/25/21 0448 03/25/21 0837 03/25/21 1142 03/25/21 1536  BP: (!) 140/57 (!) 104/59 125/66 (!) 122/52  Pulse: 70 72 70 74  Resp: '16 14 16 20  '$ Temp: 97.8 F (36.6 C) 98.4 F (36.9 C) 98.7 F (37.1 C) 98.5 F (36.9 C)  TempSrc: Oral Oral Oral Oral  SpO2: 96% 96% 95% 92%  Weight: 75.3 kg     Height: '5\' 5"'$  (1.651 m)       General: Pt is alert, awake, not in acute distress Cardiovascular: RRR, S1/S2 +, no rubs, no gallops Respiratory: CTA bilaterally, no wheezing, no rhonchi Abdominal: Soft, NT, ND, bowel sounds + Extremities: no edema, no cyanosis    The results of significant diagnostics from this hospitalization (including imaging, microbiology, ancillary and laboratory) are listed below for reference.     Microbiology: Recent Results (from the past 240 hour(s))  Resp Panel by RT-PCR (Flu A&B, Covid) Nasopharyngeal Swab     Status: None   Collection Time: 03/25/21  1:27 AM   Specimen: Nasopharyngeal Swab; Nasopharyngeal(NP) swabs in vial transport medium  Result Value Ref Range Status   SARS Coronavirus 2 by RT PCR NEGATIVE NEGATIVE Final    Comment: (NOTE) SARS-CoV-2 target nucleic acids are NOT DETECTED.  The SARS-CoV-2 RNA is generally detectable in upper respiratory specimens during the acute phase of infection. The lowest concentration of SARS-CoV-2 viral copies this assay can detect is 138 copies/mL. A negative result does not preclude SARS-Cov-2 infection and should not be used as the sole basis for treatment or other patient management decisions. A negative result may occur with  improper specimen collection/handling, submission of  specimen other than nasopharyngeal swab, presence of viral mutation(s) within the areas targeted by this assay, and inadequate number of viral copies(<138 copies/mL). A negative result must be combined with clinical observations, patient history, and epidemiological information. The expected result is Negative.  Fact Sheet for Patients:  EntrepreneurPulse.com.au  Fact Sheet for Healthcare Providers:  IncredibleEmployment.be  This test is no t yet approved or cleared by the [place] FDA and  has been authorized for detection and/or diagnosis of SARS-CoV-2 by FDA under an Emergency Use Authorization (EUA). This EUA will remain  in effect (meaning this test can be used) for the duration of the COVID-19 declaration under Section 564(b)(1) of the Act, 21 U.S.C.section 360bbb-3(b)(1), unless the authorization is terminated  or revoked sooner.       Influenza A by PCR NEGATIVE NEGATIVE Final   Influenza B by PCR NEGATIVE NEGATIVE Final    Comment: (NOTE) The Xpert Xpress SARS-CoV-2/FLU/RSV plus assay is intended as an aid in the diagnosis of influenza from Nasopharyngeal swab specimens and should not be used as a sole basis for treatment. Nasal washings and aspirates are unacceptable for Xpert Xpress SARS-CoV-2/FLU/RSV testing.  Fact Sheet for Patients: EntrepreneurPulse.com.au  Fact Sheet for Healthcare Providers: IncredibleEmployment.be  This test is not yet approved or cleared by the [place] FDA and has been authorized for detection and/or diagnosis of SARS-CoV-2 by FDA under an Emergency Use Authorization (EUA). This EUA will remain in effect (meaning this test can be used) for the duration of the COVID-19 declaration under Section 564(b)(1) of the Act, 21 U.S.C. section 360bbb-3(b)(1), unless the authorization is terminated or revoked.  Performed at [facility], [place]  [address] [place], [place] [address]      Labs: BNP (last 3 results) No results for input(s): BNP in the last 8760 hours. Basic Metabolic Panel: Recent Labs  Lab 03/24/21 1939  NA 138  K 3.9  CL 102  CO2 27  GLUCOSE 112*  BUN 23  CREATININE 0.82  CALCIUM 9.5   Liver Function Tests: Recent Labs  Lab 03/24/21 1939  AST 22  ALT 30  ALKPHOS 45  BILITOT 1.0  PROT 6.9  ALBUMIN 4.2   No results for input(s): LIPASE, AMYLASE in the last 168 hours. No results for input(s): AMMONIA in the last 168 hours. CBC: Recent Labs  Lab 03/24/21 1939  WBC 9.1  NEUTROABS 6.6  HGB 14.1  HCT 41.6  MCV 88.9  PLT 283   Cardiac Enzymes: No results for input(s): CKTOTAL, CKMB, CKMBINDEX, TROPONINI in the last 168 hours. BNP: Invalid input(s): POCBNP CBG: Recent Labs  Lab 03/25/21 0407  GLUCAP 106*   D-Dimer No results for input(s): DDIMER in the last 72 hours. Hgb A1c Recent Labs    03/25/21 0552  HGBA1C 6.1*   Lipid Profile Recent Labs    03/25/21 0552  CHOL 136  HDL 38*  LDLCALC 84  TRIG 71  CHOLHDL 3.6   Thyroid function studies No results for input(s): TSH, T4TOTAL, T3FREE, THYROIDAB in the last 72 hours.  Invalid input(s): FREET3 Anemia work up No results for input(s): VITAMINB12, FOLATE, FERRITIN, TIBC, IRON, RETICCTPCT in the last 72 hours. Urinalysis    Component Value Date/Time   COLORURINE STRAW (A) 03/25/2021 0000   APPEARANCEUR CLEAR 03/25/2021 0000   LABSPEC 1.005 03/25/2021 0000   PHURINE 7.0 03/25/2021 0000   GLUCOSEU NEGATIVE 03/25/2021 0000   HGBUR NEGATIVE 03/25/2021 0000   BILIRUBINUR NEGATIVE 03/25/2021 0000   KETONESUR NEGATIVE 03/25/2021 0000   PROTEINUR NEGATIVE 03/25/2021 0000   NITRITE NEGATIVE 03/25/2021 0000   LEUKOCYTESUR MODERATE (A) 03/25/2021 0000   Sepsis Labs Invalid input(s): PROCALCITONIN,  WBC,  LACTICIDVEN Microbiology Recent Results (from the past 240 hour(s))  Resp Panel by RT-PCR (Flu A&B, Covid)  Nasopharyngeal Swab     Status: None   Collection Time: 03/25/21  1:27 AM   Specimen: Nasopharyngeal Swab; Nasopharyngeal(NP) swabs in vial transport medium  Result Value Ref Range Status   SARS Coronavirus 2 by RT PCR NEGATIVE NEGATIVE Final    Comment: (NOTE) SARS-CoV-2 target nucleic acids are NOT DETECTED.  The SARS-CoV-2 RNA is generally detectable in upper respiratory specimens during the acute phase of infection. The lowest concentration of SARS-CoV-2 viral copies this assay can detect is 138 copies/mL. A negative result does not preclude SARS-Cov-2 infection and should not be used as the sole basis for treatment or other patient management decisions. A negative result may occur with  improper specimen collection/handling, submission of specimen other than nasopharyngeal swab, presence of viral mutation(s) within the areas targeted by this assay, and inadequate number of viral copies(<138 copies/mL). A negative result must be combined with clinical observations, patient history, and epidemiological information. The expected result is Negative.  Fact Sheet for Patients:  EntrepreneurPulse.com.au  Fact Sheet for Healthcare Providers:  [employer]  This test is no t yet approved or cleared by the [place] FDA and  has been authorized for detection and/or diagnosis of SARS-CoV-2 by FDA under an Emergency Use Authorization (EUA). This EUA will remain  in effect (meaning this test can be used) for the duration of the COVID-19 declaration under Section 564(b)(1) of the Act, 21 U.S.C.section 360bbb-3(b)(1), unless the authorization is terminated  or revoked sooner.       Influenza A by PCR NEGATIVE NEGATIVE Final   Influenza B by PCR NEGATIVE NEGATIVE Final    Comment: (NOTE) The Xpert Xpress SARS-CoV-2/FLU/RSV plus assay is intended as an aid in the diagnosis of influenza from Nasopharyngeal swab specimens and should not be  used as a sole basis for treatment. Nasal washings and aspirates are unacceptable for Xpert Xpress SARS-CoV-2/FLU/RSV testing.  Fact Sheet for Patients: EntrepreneurPulse.com.au  Fact Sheet for Healthcare Providers: [employer]  This test is not yet approved or cleared by the [place] FDA and has been authorized for detection and/or diagnosis of SARS-CoV-2 by FDA under an Emergency Use Authorization (EUA). This EUA will remain in effect (meaning this test can be used) for the duration of the COVID-19 declaration under Section 564(b)(1) of the Act, 21 U.S.C. section 360bbb-3(b)(1), unless the authorization is terminated or revoked.  Performed at [facility], [place] [address] [place], [place] [address]     SIGNED:   Cordelia Poche, MD [facility] 03/25/2021, 5:39 PM

## 2021-03-25 NOTE — ED Provider Notes (Signed)
[facility] DEPT Provider Note   CSN: [id number] Arrival date & time: 03/24/21  1858     History Chief Complaint  Patient presents with   Altered Mental Status    Ann [name] is a 75 y.o. female.  The history is provided by the patient and the spouse.  Altered Mental Status Presenting symptoms: memory loss   Severity:  Moderate Most recent episode:  Today Episode history:  Single Duration:  9 hours Timing:  Constant Progression:  Unchanged Chronicity:  New Context: not dementia, not homeless and not nursing home resident   Associated symptoms: no abdominal pain, no agitation, no difficulty breathing, no fever, no hallucinations, no rash, no slurred speech, no visual change and no vomiting   Patient with HTN presents with memory loss.  A dog got it's head caught in fence and patient reportedcly called for husband who had to take fence apart to free the dog but patient does not remember the incident or that the dog had to go to the vet.  She does not remember what happened or the ensuing events at this time.  No new medications.  No head trauma.  No f/c/r.      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     Family History  Problem Relation Age of Onset   Heart failure Mother    Osteoporosis Mother    COPD Father    Diabetes Father     Osteoporosis Sister    Osteoporosis Sister     Social History   Tobacco Use   Smoking status: Former    Packs/day: 0.10    Years: 12.00    Pack years: 1.20    Types: Cigarettes    Start date: 1964    Quit date: 1976    Years since quitting: 46.5   Smokeless tobacco: Never   Tobacco comments:    during smoking years only smoked ocassionally  Vaping Use   Vaping Use: Never used  Substance Use Topics   Alcohol use: Yes   Drug use: Never    Home Medications Prior to Admission medications   Medication Sig Start Date End Date Taking? Authorizing Provider  albuterol (VENTOLIN HFA) 108 (90 Base) MCG/ACT inhaler Inhale 1-2 puffs into the lungs every 6 (six) hours as needed for wheezing or shortness of breath. 11/10/19   Margaretha Seeds, MD  atorvastatin (LIPITOR) 20 MG tablet Take 20 mg by mouth daily. 12/26/18   [provider]  cetirizine (ZYRTEC) 10 MG tablet Take 10 mg by mouth daily.    [provider]  Cholecalciferol (VITAMIN D3) 50 MCG (2000 UT) TABS Take 1 tablet by mouth daily.    [provider]  estradiol (ESTRACE) 0.1 MG/GM vaginal cream Place 0.5 g vaginally 2 (two) times a week. 11/28/18   [provider]  fluticasone-salmeterol (ADVAIR HFA) 230-21 MCG/ACT inhaler Inhale 2 puffs into the lungs 2 (two) times daily. 10/18/20   Margaretha Seeds, MD  ibuprofen (ADVIL) 200 MG tablet Take 400 mg by mouth every 6 (six) hours as needed for moderate pain.    [provider]  lisinopril-hydrochlorothiazide (ZESTORETIC) 20-25 MG tablet Take 1 tablet by mouth daily. 12/26/18   [provider]  meloxicam (MOBIC) 15 MG tablet Take 15 mg by mouth daily. 06/01/20   [provider]    Allergies    Patient has no known allergies.  Review of Systems   Review of Systems  Unable to perform ROS: Mental status change  Constitutional:  Negative for fever.  HENT:  Negative for facial swelling.   Eyes:  Negative for redness.   Respiratory:  Negative for wheezing.   Cardiovascular:  Negative for leg swelling.  Gastrointestinal:  Negative for abdominal pain and vomiting.  Genitourinary:  Negative for difficulty urinating.  Musculoskeletal:  Negative for neck stiffness.  Skin:  Negative for rash.  Neurological:  Negative for facial asymmetry.  Psychiatric/Behavioral:  Positive for memory loss. Negative for agitation and hallucinations.    Physical Exam Updated Vital Signs BP 136/77 (BP Location: Right Arm)   Pulse 82   Temp 98.3 F (36.8 C) (Oral)   Resp 13   Ht '5\' 5"'$  (1.651 m)   Wt 70.3 kg   SpO2 98%   BMI 25.79 kg/m   Physical Exam Vitals and nursing note reviewed.  Constitutional:      General: She is not in acute distress.    Appearance: Normal appearance.  HENT:     Head: Normocephalic and atraumatic.     Nose: Nose normal.  Eyes:     Extraocular Movements: Extraocular movements intact.     Conjunctiva/sclera: Conjunctivae normal.  Cardiovascular:     Rate and Rhythm: Normal rate and regular rhythm.     Pulses: Normal pulses.     Heart sounds: Normal heart sounds.  Pulmonary:     Effort: Pulmonary effort is normal.     Breath sounds: Normal breath sounds.  Abdominal:     General: Abdomen is flat. Bowel sounds are normal.     Palpations: Abdomen is soft.     Tenderness: There is no abdominal tenderness. There is no guarding.  Musculoskeletal:        General: Normal range of motion.     Cervical back: Normal range of motion and neck supple.  Skin:    General: Skin is warm and dry.     Capillary Refill: Capillary refill takes less than 2 seconds.  Neurological:     General: No focal deficit present.     Mental Status: She is alert.     Deep Tendon Reflexes: Reflexes normal.  Psychiatric:        Mood and Affect: Mood normal.    ED Results / Procedures / Treatments   Labs (all labs ordered are listed, but only abnormal results are displayed) Results for orders placed or performed  during the hospital encounter of 03/24/21  CBC with Differential  Result Value Ref Range   WBC 9.1 4.0 - 10.5 K/uL   RBC 4.68 3.87 - 5.11 MIL/uL   Hemoglobin 14.1 12.0 - 15.0 g/dL   HCT 41.6 36.0 - 46.0 %   MCV 88.9 80.0 - 100.0 fL   MCH 30.1 26.0 - 34.0 pg   MCHC 33.9 30.0 - 36.0 g/dL   RDW 13.8  11.5 - 15.5 %   Platelets 283 150 - 400 K/uL   nRBC 0.0 0.0 - 0.2 %   Neutrophils Relative % 72 %   Neutro Abs 6.6 1.7 - 7.7 K/uL   Lymphocytes Relative 18 %   Lymphs Abs 1.6 0.7 - 4.0 K/uL   Monocytes Relative 7 %   Monocytes Absolute 0.6 0.1 - 1.0 K/uL   Eosinophils Relative 2 %   Eosinophils Absolute 0.2 0.0 - 0.5 K/uL   Basophils Relative 1 %   Basophils Absolute 0.1 0.0 - 0.1 K/uL   Immature Granulocytes 0 %   Abs Immature Granulocytes 0.04 0.00 - 0.07 K/uL  Comprehensive metabolic panel  Result Value Ref Range   Sodium 138 135 - 145 mmol/L   Potassium 3.9 3.5 - 5.1 mmol/L   Chloride 102 98 - 111 mmol/L   CO2 27 22 - 32 mmol/L   Glucose, Bld 112 (H) 70 - 99 mg/dL   BUN 23 8 - 23 mg/dL   Creatinine, Ser 0.82 0.44 - 1.00 mg/dL   Calcium 9.5 8.9 - 10.3 mg/dL   Total Protein 6.9 6.5 - 8.1 g/dL   Albumin 4.2 3.5 - 5.0 g/dL   AST 22 15 - 41 U/L   ALT 30 0 - 44 U/L   Alkaline Phosphatase 45 38 - 126 U/L   Total Bilirubin 1.0 0.3 - 1.2 mg/dL   GFR, Estimated >60 >60 mL/min   Anion gap 9 5 - 15  Urinalysis, Routine w reflex microscopic  Result Value Ref Range   Color, Urine STRAW (A) YELLOW   APPearance CLEAR CLEAR   Specific Gravity, Urine 1.005 1.005 - 1.030   pH 7.0 5.0 - 8.0   Glucose, UA NEGATIVE NEGATIVE mg/dL   Hgb urine dipstick NEGATIVE NEGATIVE   Bilirubin Urine NEGATIVE NEGATIVE   Ketones, ur NEGATIVE NEGATIVE mg/dL   Protein, ur NEGATIVE NEGATIVE mg/dL   Nitrite NEGATIVE NEGATIVE   Leukocytes,Ua MODERATE (A) NEGATIVE   RBC / HPF 0-5 0 - 5 RBC/hpf   WBC, UA 11-20 0 - 5 WBC/hpf   Bacteria, UA RARE (A) NONE SEEN   Squamous Epithelial / LPF 0-5 0 - 5   Mucus  PRESENT    DG Chest 1 View  Result Date: 03/25/2021 CLINICAL DATA:  Sudden onset confusion EXAM: CHEST  1 VIEW COMPARISON:  03/26/2019 FINDINGS: Low lung volume. No focal opacity or pleural effusion. Moderate hiatal hernia. IMPRESSION: No active disease.  Hiatal hernia Electronically Signed   By: Donavan Foil M.D.   On: 03/25/2021 00:36   CT Head Wo Contrast  Result Date: 03/24/2021 CLINICAL DATA:  Delirium/confusion EXAM: CT HEAD WITHOUT CONTRAST TECHNIQUE: Contiguous axial images were obtained from the base of the skull through the vertex without intravenous contrast. COMPARISON:  None. FINDINGS: Brain: No evidence of acute infarction, hemorrhage, hydrocephalus, extra-axial collection or mass lesion/mass effect. Subcortical white matter and periventricular small vessel ischemic changes. Vascular: No hyperdense vessel or unexpected calcification. Skull: Normal. Negative for fracture or focal lesion. Sinuses/Orbits: Partial opacification of the right frontal and left maxillary sinus. Mastoid air cells are clear. Other: None. IMPRESSION: No evidence of acute intracranial abnormality. Small vessel ischemic changes. Electronically Signed   By: Julian Hy M.D.   On: 03/24/2021 19:52     Radiology DG Chest 1 View  Result Date: 03/25/2021 CLINICAL DATA:  Sudden onset confusion EXAM: CHEST  1 VIEW COMPARISON:  03/26/2019 FINDINGS: Low lung volume. No focal opacity or pleural effusion. Moderate hiatal  hernia. IMPRESSION: No active disease.  Hiatal hernia Electronically Signed   By: Donavan Foil M.D.   On: 03/25/2021 00:36   CT Head Wo Contrast  Result Date: 03/24/2021 CLINICAL DATA:  Delirium/confusion EXAM: CT HEAD WITHOUT CONTRAST TECHNIQUE: Contiguous axial images were obtained from the base of the skull through the vertex without intravenous contrast. COMPARISON:  None. FINDINGS: Brain: No evidence of acute infarction, hemorrhage, hydrocephalus, extra-axial collection or mass lesion/mass  effect. Subcortical white matter and periventricular small vessel ischemic changes. Vascular: No hyperdense vessel or unexpected calcification. Skull: Normal. Negative for fracture or focal lesion. Sinuses/Orbits: Partial opacification of the right frontal and left maxillary sinus. Mastoid air cells are clear. Other: None. IMPRESSION: No evidence of acute intracranial abnormality. Small vessel ischemic changes. Electronically Signed   By: Julian Hy M.D.   On: 03/24/2021 19:52    Procedures Procedures   Medications Ordered in ED Medications  cefTRIAXone (ROCEPHIN) 1 g in sodium chloride 0.9 % 100 mL IVPB (has no administration in time range)    ED Course  I have reviewed the triage vital signs and the nursing notes.  Pertinent labs & imaging results that were available during my care of the patient were reviewed by me and considered in my medical decision making (see chart for details).    MDM Rules/Calculators/A&P                          Admit to medicine for UTI and TGA.   Final Clinical Impression(s) / ED Diagnoses Final diagnoses:  None    Rx / DC Orders ED Discharge Orders     None        Tommy Goostree, MD 03/25/21 [id number]

## 2021-03-25 NOTE — Plan of Care (Signed)

## 2021-03-25 NOTE — TOC Transition Note (Signed)
Transition of Care [facility]) - CM/SW Discharge Note   Patient Details  Name: Anaclara Bufkin MRN: [id number] Date of Birth: [birth year]  [facility] CM/SW Contact:  Pollie Friar, RN Phone Number: 03/25/2021, 5:15 PM   Clinical Narrative:    Recommendations are for outpatient therapy. Pt prefers [facility] [address]. Information on the AVS. No DME needs.  Pt denies issues with home medications or transportation. Pt has transport home when discharged.   Final next level of care: OP Rehab Barriers to Discharge: No Barriers Identified   Patient Goals and CMS Choice        Discharge Placement                       Discharge Plan and Services                                     Social Determinants of Health (SDOH) Interventions     Readmission Risk Interventions No flowsheet data found.

## 2021-03-25 NOTE — ED Notes (Signed)
Report to [facility] with no further questions. [facility] called at this time.

## 2021-03-25 NOTE — Discharge Instructions (Addendum)
Ann [name],  You were the hospital for:  Transient global amnesia: this was confirmed on MRI. Symptoms should improve with time. 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 The neurosurgeon evaluated you and recommended outpatient follow-up.  Meningioma: this was seen on your MRI. This is likely benign and can be followed up by the neurosurgeon.  Urinary symptoms: you are being treated for a possible urinary infection. You received a dose of Ceftriaxone in the hospital and will continue with Keflex for another 4 days.

## 2021-03-25 NOTE — Progress Notes (Addendum)
STROKE TEAM PROGRESS NOTE   INTERVAL HISTORY  Admitted last evening for TIA workup after having an episode of amnesia. No significant overnight events.  She denies prior history of similar episodes.  Denies recent medications changes. Denies illicit drug use. She does admit to being under a high level of stress lately, although she did not go into this further.   We also reviewed her brain imaging findings. Discussed recommendation for follow up of the asymptomatic 5 to 6 mm  Lt MCA aneurysm . MRI scan of the brain shows a typical punctate right medial temporal hippocampal hyperintensity on diffusion and FLAIR which is dark on ADC map. Vitals:   03/24/21 1919 03/24/21 2352 03/25/21 0448 03/25/21 0837  BP: (!) 147/87 136/77 (!) 140/57 (!) 104/59  Pulse: 97 82 70 72  Resp: '15 13 16 14  '$ Temp: 98.3 F (36.8 C)  97.8 F (36.6 C) 98.4 F (36.9 C)  TempSrc: Oral  Oral Oral  SpO2: 94% 98% 96% 96%  Weight: 70.3 kg  75.3 kg   Height: '5\' 5"'$  (1.651 m)  '5\' 5"'$  (1.651 m)    CBC:  Recent Labs  Lab 03/24/21 1939  WBC 9.1  NEUTROABS 6.6  HGB 14.1  HCT 41.6  MCV 88.9  PLT [id number]   Basic Metabolic Panel:  Recent Labs  Lab 03/24/21 1939  NA 138  K 3.9  CL 102  CO2 27  GLUCOSE 112*  BUN 23  CREATININE 0.82  CALCIUM 9.5   Lipid Panel:  Recent Labs  Lab 03/25/21 0552  CHOL 136  TRIG 71  HDL 38*  CHOLHDL 3.6  VLDL 14  LDLCALC 84   HgbA1c:  Recent Labs  Lab 03/25/21 0552  HGBA1C 6.1*   Head CT w/o contrast:  no acute infarct. 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 rate (!) 79, height '5\' 5"'$  (1.651 m), weight 75.3 kg, SpO2 95  %.   General: elderly appearing female in NAD Neurologic exam: Mental status:  alert and oriented; normal affect.  Immediate recall intact. Recalled 2/3 words after 2 minutes.  Speech:  normal speech pattern and articulation Cranial Nerves:  EOMs intact. Peripheral vision intact. No facial droop. Tongue midline. Motor:  5/5 strength in the bilateral upper and lower extremities. No pronator drift. Fine motor movement intact Sensation:  intact in the bilateral upper and lower extremities Coordination/Complex Motor:  Rapid alternating movements intact. Finger to nose intact   ASSESSMENT/PLAN [name] a 75y.o. female with hypertension and hyperlipidemia who presented for evaluation for an episode of anterograde amnesia  Transient global amnesia Alternative diagnosis could include seizure and TIA Symptoms are resolved today.  Brain imaging negative for acute infarct although chronic microvascular disease is present.  Plan -EEG -discussed importance of reducing stress -start aspirin '81mg'$  daily  Mild cognitive impairment (SLUMS score 26/30).  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 -recommend follow up scan in 6 months -f/u with neurology  Hypertension: continue current management Hyperlipidemia: continue high intensity statin  Prediabetes: follow up with PCP for ongoing monitoring   Hospital day #  [place], MD Internal Medicine Resident PGY-3 [facility] Pager: [phone] 03/25/2021 12:54 PM    Stroke MD note: I have personally obtained history,examined this patient, reviewed notes, independently viewed imaging studies, participated in medical decision making and plan of care.ROS completed by me personally and pertinent positives fully documented  I have made any additions or clarifications directly to the  above note. Agree with note above.  Patient presented with sudden onset of amnesia for recent events for a couple of hours yesterday evening.  She is able to recall events prior to the episode and since the episode quite well.  She denies any headache.  No prior history of seizures strokes or TIAs.  MRI scan shows a typical medial right hippocampal hyperintensity on diffusion/FLAIR has been described in transient global amnesia.  I expect patient's memory issues improve gradually over the next few days.  Check EEG for any irritability or seizure activity.  Patient may be discharged home.  She may follow-up electively as an outpatient in 2 months.  Long discussion patient and answered questions.  Discussed with Dr. Lonny Prude.  Greater than 50% time during this 35-minute visit was spent on counseling and coordination of care about her amnesia and discussion about TIA and stroke and answering questions.  Antony Contras, MD Medical Director [facility] Pager: [phone] 03/25/2021 1:18 PM  To contact Stroke Continuity provider, please refer to [email or web address] After hours, contact General Neurology

## 2021-03-25 NOTE — Progress Notes (Signed)
Occupational Therapy Evaluation Patient Details Name: Ann [name] MRN: [id number] DOB: [birth year] Today's Date: 03/25/2021    History of Present Illness 75 yo admitted with amnesia. Being worked up for [employer]. MRI pending. Per neurology note, likely small vessel stroke/TIA involving either the mesial temporal lobe or the C1 area of the dominant hippocampus. PMH: HTN, HLD, pneumonia.   Clinical Impression   PTA pt lives independently with her husband and enjoys reading and spending time with her grandchildren. Pt with flat affect and demonstrates slow processing of information and apparent difficulty with visual memory. "I feel like I'm in a fog". Scored WNL on the Short Blessed Test, however recommend further cognitive testing of executive level skills. At this time recommend follow up with OT to facilitate safety and independence with IADL tasks. Will follow acutely.     Follow Up Recommendations  Outpatient OT (neuro outpt)    Equipment Recommendations  None recommended by OT    Recommendations for Other Services PT consult;Speech consult     Precautions / Restrictions Precautions Precautions: Fall      Mobility Bed Mobility Overal bed mobility: Modified Independent                  Transfers Overall transfer level: Needs assistance   Transfers: Sit to/from Stand Sit to Stand: Supervision              Balance Overall balance assessment: Needs assistance;Mild deficits observed, not formally tested   Sitting balance-Leahy Scale: Good       Standing balance-Leahy Scale: Fair                             ADL either performed or assessed with clinical judgement   ADL Overall ADL's : Needs assistance/impaired                                     Functional mobility during ADLs: Supervision/safety General ADL Comments: able to complete basic ADL tasks with S. REcommend S for IADL tasks     Vision Baseline Vision/History:  Wears glasses Wears Glasses: Reading only Patient Visual Report: No change from baseline Vision Assessment?: No apparent visual deficits     Perception Perception Perception Tested?: Yes Comments: question impariment with visual memory. Difficulty placing hands correctly on clock draw test. "I know this is not right"   Praxis Praxis Praxis tested?: Within functional limits    Pertinent Vitals/Pain Pain Assessment: No/denies pain     Hand Dominance Right   Extremity/Trunk Assessment Upper Extremity Assessment Upper Extremity Assessment: Overall WFL for tasks assessed   Lower Extremity Assessment Lower Extremity Assessment: Defer to PT evaluation   Cervical / Trunk Assessment Cervical / Trunk Assessment: Normal   Communication Communication Communication: No difficulties   Cognition Arousal/Alertness: Awake/alert Behavior During Therapy: WFL for tasks assessed/performed Overall Cognitive Status: Impaired/Different from baseline Area of Impairment: Attention;Awareness;Problem solving;Memory                   Current Attention Level: Selective Memory:  (Amnestic of event; recall 5/5 words without difficulty after 1 min delay)     Awareness: Emergent Problem Solving: Slow processing General Comments: scored 0 on the Short Blessed Test which places her in the normal category; Ablet o comlete sequential subraction without difficulty. Increased time to complete baqsic math calculations   General Comments  Exercises     Shoulder Instructions      Home Living Family/patient expects to be discharged to:: Private residence Living Arrangements: Spouse/significant other Available Help at Discharge: Available PRN/intermittently Type of Home: House Home Access: Stairs to enter [employer] of Steps: 3 Entrance Stairs-Rails: Left Home Layout: One level     Bathroom Shower/Tub: Tub/shower unit;Door   [employer] Toilet: Standard Bathroom  Accessibility: Yes How Accessible: Accessible via walker Home Equipment: None          Prior Functioning/Environment Level of Independence: Independent        Comments: drives; manages own medications, helps with managing bills/fincnaces        OT Problem List: Impaired balance (sitting and/or standing);Impaired vision/perception;Decreased cognition;Decreased safety awareness      OT Treatment/Interventions: Self-care/ADL training;DME and/or AE instruction;Therapeutic activities;Visual/perceptual remediation/compensation;Cognitive remediation/compensation;Patient/family education;Balance training    OT Goals(Current goals can be found in the care plan section) Acute Rehab OT Goals Patient Stated Goal: to find out what happened OT Goal Formulation: With patient Time For Goal Achievement: 04/08/21 Potential to Achieve Goals: Good  OT Frequency:     Barriers to D/C:            Co-evaluation              AM-PAC OT "6 Clicks" Daily Activity     Outcome Measure Help from another person eating meals?: None Help from another person taking care of personal grooming?: A Little Help from another person toileting, which includes using toliet, bedpan, or urinal?: A Little Help from another person bathing (including washing, rinsing, drying)?: A Little Help from another person to put on and taking off regular upper body clothing?: A Little Help from another person to put on and taking off regular lower body clothing?: A Little 6 Click Score: 19   End of Session Equipment Utilized During Treatment: Gait belt Nurse Communication: Mobility status  Activity Tolerance: Patient tolerated treatment well Patient left: in bed;with call bell/phone within reach;with bed alarm set (Pt requesting to try to sleep)  OT Visit Diagnosis: Unsteadiness on feet (R26.81);Other symptoms and signs involving cognitive function                Time: [id number] OT Time Calculation (min): 22  min Charges:  OT General Charges $OT Visit: 1 Visit OT Evaluation $OT Eval Moderate Complexity: [place], OT/L   Acute OT Clinical Specialist Acute Rehabilitation Services Pager [phone] Office [phone]   [facility] [place] 03/25/2021, 9:48 AM

## 2021-03-25 NOTE — Progress Notes (Signed)
EEG complete - results pending 

## 2021-03-25 NOTE — Procedures (Signed)
Patient Name: Ann [name]  MRN: [id number]  Epilepsy Attending: Lora Havens  Referring Physician/Provider: Dr Mitzi Hansen Date: 03/25/2021 Duration: 23.37 mins  Patient history: 76 y.o. female with PMH significant for HTN, HLD,  chronic bronchitis, who presented to the ED with episode of anterograde amnesia. EEG to evaluate for seizure  Level of alertness: Awake, asleep  AEDs during EEG study: None  Technical aspects: This EEG study was done with scalp electrodes positioned according to the 10-20 International system of electrode placement. Electrical activity was acquired at a sampling rate of '500Hz'$  and reviewed with a high frequency filter of '70Hz'$  and a low frequency filter of '1Hz'$ . EEG data were recorded continuously and digitally stored.   Description: The posterior dominant rhythm consists of 9-10 Hz activity of moderate voltage (25-35 uV) seen predominantly in posterior head regions, symmetric and reactive to eye opening and eye closing. Sleep was characterized by vertex waves, sleep spindles (12 to 14 Hz), maximal frontocentral region. Hyperventilation and photic stimulation were not performed.     IMPRESSION: This study is within normal limits. No seizures or epileptiform discharges were seen throughout the recording.  Jacquese Hackman Barbra Sarks

## 2021-03-25 NOTE — Evaluation (Signed)
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 PMH: chronic bronchitis, HLD, HTN, PNA, pulmonary nodule, Sepsis   Assessment / Plan / Recommendation Clinical Impression  The [place] Mental Status (SLUMS) Examination was administered to assess cognitive linguistic skills. Pt scored 26/30, indicating mild neurocognitive impairment. Deficits identified includemental math and digit reversal. Pt was able to recall 5/5 unrelated words after a delay, and performed adequately on divergent naming task. She was fully oriented, and completed clock drawing task without deficit. Given pt's level of independence and function prior to admit, referral to outpatient speech therapy is recommended to more fully assess executive functions and other higher level cognitive skills. No further acute ST is recommended at this time.    SLP Assessment  SLP Recommendation/Assessment: All further Speech Language Pathology  needs can be addressed in the next venue of care SLP Visit  Diagnosis: Cognitive communication deficit (R41.841)    Follow Up Recommendations  Outpatient SLP          SLP Evaluation Cognition  Overall Cognitive Status: Impaired/Different from baseline Arousal/Alertness: Awake/alert Orientation Level: Oriented X4 Attention: Focused;Sustained Focused Attention: Appears intact Sustained Attention: Appears intact Memory: Appears intact (recalled 5/5 unrelated words after a delay) Problem Solving: Impaired Problem Solving Impairment: Functional basic       Comprehension  Auditory Comprehension Overall Auditory Comprehension: Appears within functional limits for tasks assessed    Expression Expression Primary Mode of Expression: Verbal Verbal Expression Overall Verbal Expression: Appears within functional limits for tasks assessed Written Expression Dominant Hand: Right Written Expression: Not tested   Oral / Motor  Oral Motor/Sensory Function Overall Oral Motor/Sensory Function: Within functional limits Motor Speech Overall Motor Speech: Appears within functional limits for tasks assessed   GO                   Cabot Cromartie B. Quentin Ore, [facility], [place] Speech Language Pathologist Office: [phone] Pager: [phone]  Shonna Chock 03/25/2021, 12:07 PM

## 2021-03-27 LAB — URINE CULTURE: Culture: >=100000 — AB

## 2021-04-05 DIAGNOSIS — N390 Urinary tract infection, site not specified: Secondary | ICD-10-CM | POA: Diagnosis not present

## 2021-04-05 DIAGNOSIS — E7800 Pure hypercholesterolemia, unspecified: Secondary | ICD-10-CM | POA: Diagnosis not present

## 2021-04-05 DIAGNOSIS — I671 Cerebral aneurysm, nonruptured: Secondary | ICD-10-CM | POA: Diagnosis not present

## 2021-04-05 DIAGNOSIS — G454 Transient global amnesia: Secondary | ICD-10-CM | POA: Diagnosis not present

## 2021-04-05 DIAGNOSIS — I10 Essential (primary) hypertension: Secondary | ICD-10-CM | POA: Diagnosis not present

## 2021-04-05 DIAGNOSIS — R7303 Prediabetes: Secondary | ICD-10-CM | POA: Diagnosis not present

## 2021-04-08 ENCOUNTER — Ambulatory Visit: Payer: Medicare Other | Admitting: Physical Therapy

## 2021-04-08 ENCOUNTER — Ambulatory Visit: Payer: Medicare Other

## 2021-04-08 ENCOUNTER — Encounter: Payer: Self-pay | Admitting: Physical Therapy

## 2021-04-08 ENCOUNTER — Ambulatory Visit: Payer: Medicare Other | Attending: Family Medicine | Admitting: Occupational Therapy

## 2021-04-08 ENCOUNTER — Encounter: Payer: Self-pay | Admitting: Occupational Therapy

## 2021-04-08 ENCOUNTER — Other Ambulatory Visit: Payer: Self-pay

## 2021-04-08 DIAGNOSIS — M25551 Pain in right hip: Secondary | ICD-10-CM

## 2021-04-08 DIAGNOSIS — M6281 Muscle weakness (generalized): Secondary | ICD-10-CM

## 2021-04-08 DIAGNOSIS — R41840 Attention and concentration deficit: Secondary | ICD-10-CM | POA: Diagnosis not present

## 2021-04-08 DIAGNOSIS — R2689 Other abnormalities of gait and mobility: Secondary | ICD-10-CM

## 2021-04-08 DIAGNOSIS — R41841 Cognitive communication deficit: Secondary | ICD-10-CM | POA: Diagnosis not present

## 2021-04-08 NOTE — Therapy (Signed)
[facility] [address] [place], [place], [address] Phone: [phone]   Fax:  [phone]  Speech Language Pathology Evaluation  Patient Details  Name: Ann [name] MRN: [id number] Date of Birth: 1946/04/29 Referring Provider (SLP): Cordelia Poche MD (ref); Marilynne Drivers, PA (documentation)   Encounter Date: 04/08/2021   End of Session - 04/08/21 1336     Visit Number 1    Number of Visits 17    Date for SLP Re-Evaluation 06/17/21    SLP Start Time 0855    SLP Stop Time  0935    SLP Time Calculation (min) 40 min    Activity Tolerance Other (comment)   appeared slightly anxious during eval            Past Medical History:  Diagnosis Date   Bronchitis, chronic ([place])    Hyperlipidemia    Hypertension    Pneumonia    Pulmonary nodule    Sepsis ([place])     Past Surgical History:  Procedure Laterality Date   CHOLECYSTECTOMY      There were no vitals filed for this visit.   Subjective Assessment - 04/08/21 1141     Subjective Pt reports she feels she is a bit "slower" than she was prior to admission, but other than that feels sx have resolved.    Currently in Pain? No/denies                SLP Evaluation [facility] - 04/08/21 1141       SLP Visit Information   SLP Received On 04/08/21    Referring Provider (SLP) Cordelia Poche MD (ref); Marilynne Drivers, PA (documentation)    Onset Date 03-24-21    Medical Diagnosis Transient Global Amnesia; meningioma      General Information   HPI 75yo female admitted 03/24/21 with poor recall of events of the morning. Brain imaging found 5-6 mm aneurysm which neurosx consult was recommended (occurs next week), and a ~29m area of dural-based enhancement in rt lateral temporal convexity concerning for meningioma. PMH: chronic bronchitis, HLD, HTN, PNA, pulmonary nodule, Sepsis. SLUMS as inpatient 26/30 - mild cognitive impairment. OPST recommended.      Prior  Functional Status   Cognitive/Linguistic Baseline Within functional limits    Type of Home House     Lives With Spouse      Cognition   Overall Cognitive Status Impaired/Different from baseline    Area of Impairment Attention;Awareness;Problem solving   higher level attention, emergent awareness   Attention Comments pt reports she feels "slower" than she did prior to admission, and indicates she feels "maybe a little more (hard to focus than prior to hospitalization). Differential dx could be auditory/overall processing deficit.    Awareness Comments pt was unaware of errors with her clock drawing, even after having done a clock drawing in hospital and said, "Oh, I'm seeing this again."    Problem Solving Comments Was unable to solve how to fix her trail making task on cognitive linguistic quick test    Behaviors Restless;Other (comment)   (anxious, possibly)                            SLP Education - 04/08/21 1332     Education Details results of eval/deficit areas    Person(s) Educated Patient    Methods Explanation;Handout    Comprehension Verbalized understanding;Need further instruction  SLP Short Term Goals - 04/08/21 1351       SLP SHORT TERM GOAL #1   Title pt will complete objective cognitive assessment    Time 1    Period Weeks   or 2 sessions   Status New    Target Date 04/22/21      SLP SHORT TERM GOAL #2   Title pt will demo WFL alternating attention between two mod complex tasks in 2 sessions    Time 4    Period Weeks    Status New    Target Date 05/13/21   pt starts ST in two weeks     SLP [place] #3   Title pt will demo organization of household tasks/to-do list/daily tasks with copmensatory strategies in 3 visits    Time 4    Status New    Target Date 05/13/21      SLP SHORT TERM GOAL #4   Title pt will double check her work in all therapy tasks in 3 sessions    Time 4    Period Weeks    Status New     Target Date 05/13/21              SLP Long Term Goals - 04/08/21 1406       SLP [place] #1   Title pt will demo functional divided attention in conversation and simple cognitive task in 3 sessions    Time 8    Period Weeks    Status New    Target Date 06/10/21      SLP LONG TERM GOAL #2   Title pt will demo knowledge of ways her cognitive lingistic deficits may impact her daily life in 3 sessions    Time 8    Period Weeks    Status New    Target Date 06/10/21              Plan - 04/08/21 1337     Clinical Impression Statement Ann [name] presents today reporting she feels "slower" than before her hsopitalization of global transient amnesia, and incidental findings of probable 78m dural-based meningioma (likely benign) in rt lateral temporal calvarium, and a 5-6 mm aneurysm warranting neurosx consult next week. Pt scored 26/30 on SLUMS while an inpatient indicating mild cognitive deficits and OPST was recommended. Formal cognitive eval to be completed next session. Skilled ST is necessary for improving pt's cognitive deficits.    Speech Therapy Frequency 2x / week    Duration 8 weeks    Treatment/Interventions Environmental controls;Compensatory techniques;Functional tasks;SLP instruction and feedback;Cueing hierarchy;Cognitive reorganization;Patient/family education;Internal/external aids    Potential to Achieve Goals Good    Consulted and Agree with Plan of Care Patient             Patient will benefit from skilled therapeutic intervention in order to improve the following deficits and impairments:   Cognitive communication deficit    Problem List Patient Active Problem List   Diagnosis Date Noted   TGA (transient global amnesia) 03/25/2021   Cerebral aneurysm 03/25/2021   UTI (urinary tract infection), uncomplicated [id number]  Acute bronchitis 03/21/2021   Abnormal findings on diagnostic imaging of lung 09/22/2019   ILD (interstitial lung  disease) [place] 09/22/2019   Allergic rhinitis [id number]  Eosinophilic leukocytosis [id number]  Hypertension 04/09/2019   Hyperlipidemia 04/09/2019   [place]itis ([place] 04/09/2019    Ann [name]. ,[place] CCC-SLP  04/08/2021, 2:08 PM  [facility]  [address] [place], [place], [address] Phone: [phone]   Fax:  [phone]  Name: Ann [name] MRN: [id number] Date of Birth: 24-Nov-1945

## 2021-04-08 NOTE — Therapy (Signed)
[place] [address] [place], [place], [address] Phone: [phone]   Fax:  [phone]  Physical Therapy Evaluation  Patient Details  Name: Ann [name] MRN: [id number] Date of Birth: 01-01-1946 Referring Provider (PT): Mariel Aloe, MD   Encounter Date: 04/08/2021   PT End of Session - 04/08/21 0912     Visit Number 1    Number of Visits 8    Date for PT Re-Evaluation 05/06/21    Authorization Type Medicare    PT Start Time 0930    PT Stop Time [id number]    PT Time Calculation (min) 45 min    Activity Tolerance Patient tolerated treatment well    Behavior During Therapy [facility] for tasks assessed/performed             Past Medical History:  Diagnosis Date   Bronchitis, chronic ([place])    Hyperlipidemia    Hypertension    Pneumonia    Pulmonary nodule    Sepsis ([place])     Past Surgical History:  Procedure Laterality Date   CHOLECYSTECTOMY      There were no vitals filed for this visit.    Subjective Assessment - 04/08/21 0944     Subjective Pt reports that ~2 wks ago she had a traumatic experience when one of her son's smaller dog got her head stuck in the fence (she does not remember all of this), son and husband took dog to emergency vet, in the meanwhile they called pt and she had no recollection of what happened. Pt's family took her to ER but pt did not remember any of this. Finally "came to" in the evening in the ER. Found pt to have brain aneurysm and benign meningioma with UTI. Is to see the neurosurgeon and urologist. Pt reports she can still function in the house. Pt reports chronic R LE pain and will take acetameniphen and it would help.    Pertinent History brain aneurysm and benign meningioma with UTI; intersitial lung disease    How long can you sit comfortably? n/a    How long can you stand comfortably? n/a    How long can you walk comfortably? 10-15 min now; was doing 7,000 steps in  2020    Patient Stated Goals Wants to return to wellness and walking    Currently in Pain? No/denies                [facility] PT Assessment - 04/08/21 0001       Assessment   Medical Diagnosis TGA    Referring Provider (PT) Mariel Aloe, MD    Prior Therapy None      Precautions   Precautions None      Restrictions   Weight Bearing Restrictions No      Balance Screen   Has the patient fallen in the past 6 months No      [place] residence    Living Arrangements Spouse/significant other    Available Help at Discharge Family      Prior Function   Level of Independence Independent      ROM / Strength   AROM / PROM / Strength Strength      Strength   Overall Strength Comments Bilat LEs grossly 4 to 4+/5; weakest with hip abd at 3+/5 and hip extensors 4-/5      Transfers   Five time sit to stand comments  10 sec  6 Minute walk- Post Test   6 Minute Walk Post Test yes      6 minute walk test results    Aerobic Endurance Distance Walked 1500    Endurance additional comments 0/10 [facility]      Standardized Balance Assessment   Standardized Balance Assessment Berg Balance Test      Berg Balance Test   Sit to Stand Able to stand without using hands and stabilize independently    Standing Unsupported Able to stand safely 2 minutes    Sitting with Back Unsupported but Feet Supported on Floor or Stool Able to sit safely and securely 2 minutes    Stand to Sit Sits safely with minimal use of hands    Transfers Able to transfer safely, minor use of hands    Standing Unsupported with Eyes Closed Able to stand 10 seconds safely    Standing Unsupported with Feet Together Able to place feet together independently and stand 1 minute safely    From Standing, Reach Forward with Outstretched Arm Can reach forward >12 cm safely (5")    From Standing Position, Pick up Object from Floor Able to pick up shoe safely and easily    From Standing  Position, Turn to Look Behind Over each Shoulder Looks behind from both sides and weight shifts well    Turn 360 Degrees Able to turn 360 degrees safely in 4 seconds or less    Standing Unsupported, Alternately Place Feet on Step/Stool Able to stand independently and safely and complete 8 steps in 20 seconds    Standing Unsupported, One Foot in Front Able to place foot tandem independently and hold 30 seconds    Standing on One Leg Able to lift leg independently and hold 5-10 seconds   7 sec on R LE; 10 sec on L LE   Total Score 54    Berg comment: 54/56                        Objective measurements completed on examination: See above findings.               PT Education - 04/08/21 1428     Education Details Exam findings, POC    Person(s) Educated Patient    Methods Explanation    Comprehension Verbalized understanding                 PT Long Term Goals - 04/08/21 1430       PT LONG TERM GOAL #1   Title Pt will be independent with HEP to transition to community wellness programs    Time 4    Period Weeks    Status New    Target Date 05/06/21      PT LONG TERM GOAL #2   Title Pt will improve 6 MWT to at least 500 meters (1640') to be within her age norms    Baseline 457 meters (1500')    Time 4    Period Weeks    Status New    Target Date 05/06/21      PT LONG TERM GOAL #3   Title Pt will be able to walk at least 10 to 15 minutes daily    Baseline Currently not on home walking program    Time 4    Period Weeks    Status New    Target Date 05/06/21  Plan - 04/08/21 1015     Clinical Impression Statement Mrs. Ann [name] is a 75 y/o F presenting to OPPT s/p transient global amnesia and generalized weakness. PMH significant for HTN, hyperlipidemia, and interstitial lung disease. MRI significant for brain aneurysm and benign meningioma. On assessment, pt with R hip pain, general hip weakness and  decreasing activity tolerance in the past few years. Pt's 6 MWT is lower than her age norms. Balance screening places her at a low risk of falls per her Berg Balance Score. Pt's R hip pain has been increasing lately and may benefit from further PT evaluation and strengthening. Pt's goals to transition to community wellness (i.e. [employer], Recruitment consultant, etc.)    Personal Factors and Comorbidities Age    Examination-Activity Limitations Locomotion Level;Squat    Examination-Participation Restrictions Community Activity    Stability/Clinical Decision Making Stable/Uncomplicated    Clinical Decision Making Low    Rehab Potential Good    PT Frequency 2x / week    PT Duration 4 weeks    PT Treatment/Interventions Aquatic Therapy;Gait training;Stair training;Functional mobility training;Therapeutic activities;Therapeutic exercise;Balance training;Neuromuscular re-education;Patient/family education;Passive range of motion;Dry needling;Joint Manipulations;Taping;Moist Heat;Cryotherapy;Electrical Stimulation    PT Next Visit Plan Please assess pt's R hip/back. Work on general strengthening (focus on hips) and endurance.    Consulted and Agree with Plan of Care Patient             Patient will benefit from skilled therapeutic intervention in order to improve the following deficits and impairments:  Decreased mobility, Decreased endurance, Pain, Decreased strength  Visit Diagnosis: Muscle weakness (generalized)  Other abnormalities of gait and mobility  Pain in right hip     Problem List Patient Active Problem List   Diagnosis Date Noted   TGA (transient global amnesia) 03/25/2021   Cerebral aneurysm 03/25/2021   UTI (urinary tract infection), uncomplicated [id number]   Acute bronchitis 03/21/2021   Abnormal findings on diagnostic imaging of lung 09/22/2019   ILD (interstitial lung disease) ([place]) 09/22/2019   Allergic rhinitis 123XX123   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 [place], [place], [address] Phone: [phone]   Fax:  [phone]  Name: Ann [name] MRN: [id number] Date of Birth: 1945/12/20

## 2021-04-08 NOTE — Therapy (Signed)
[facility] [address] [place], [place], [address] Phone: [phone]   Fax:  [phone]  Occupational Therapy Evaluation  Patient Details  Name: Ann [name] MRN: [id number] Date of Birth: [birth year] Referring Provider (OT): Cordelia Poche, MD   Encounter Date: 04/08/2021   OT End of Session - 04/08/21 1327     Visit Number 1    Number of Visits 1    Authorization Type Medicare A&B/BCBS    Authorization Time Period Follow Medicare Guidelines    OT Start Time 1015    OT Stop Time 1100    OT Time Calculation (min) 45 min    Activity Tolerance Patient tolerated treatment well    Behavior During Therapy [facility] for tasks assessed/performed             Past Medical History:  Diagnosis Date   Bronchitis, chronic ([place])    Hyperlipidemia    Hypertension    Pneumonia    Pulmonary nodule    Sepsis ([place])     Past Surgical History:  Procedure Laterality Date   CHOLECYSTECTOMY      There were no vitals filed for this visit.   Subjective Assessment - 04/08/21 1354     Subjective  Pt is a 75 year old female that presents to Neuro OPOT with Altered Mental Status (AMS) and Transient Global Amnesia (TGA) on 03/24/21. Pt with PMH significant ofr pneumonia in 2017 and HTN. Pt reports that imaging revealed a benign meningioma present and has not been evaluated by neurosurgery yet. Pt reports goal to "remain and do things I am doing now with cleaning and cooking"    Patient Stated Goals "remain and do things I am doing now with cleaning and cooking"    Currently in Pain? No/denies               [facility] OT Assessment - 04/08/21 1020       Assessment   Medical Diagnosis Transient Global Amnesia (TGA)    Referring Provider (OT) Cordelia Poche, MD    Onset Date/Surgical Date 03/24/21    Hand Dominance Right    Prior Therapy None      Precautions   Precautions Other (comment)      Balance Screen   Has the  patient fallen in the past 6 months No      Home  Environment   Family/patient expects to be discharged to: Private residence    Living Arrangements Spouse/significant other   no pets   Available Help at Discharge Family    Type of [place] to live on main level with bedroom/bathroom    Bathroom Shower/Tub Tub/Shower unit    [place] None      Prior Function   Level of Independence Independent    Vocation Retired    Leisure walk, read, Haematologist      ADL   Eating/Feeding Modified independent    Grooming Modified independent    Upper Body Bathing Modified independent    Lower Body Bathing Modified independent    Upper Body Dressing Independent    Lower Body Dressing Modified independent    Toilet Transfer Modified independent    [place] independent    [employer] Transfer Modified independent    ADL comments pt reports no difficulty with ADLs      IADL   Shopping Takes care of all  shopping needs independently    Light Housekeeping Does personal laundry completely    Meal Prep Plans, prepares and serves adequate meals independently    Prior Level of Function Community Mobility independent    [place] own vehicle    Medication Management Is responsible for taking medication in correct dosages at correct time    Physiological scientist financial matters independently (budgets, writes checks, pays rent, bills goes to [employer]), collects and keeps track of income;Requires assistance   set up online     Written Expression   Dominant Hand Right    Handwriting 100% legible      Vision - History   Baseline Vision Wears glasses only for reading    Visual History Cataracts    Additional Comments pt denies any changes      Cognition   Overall Cognitive Status No family/caregiver present to determine baseline cognitive functioning    Area of Impairment  Attention;Awareness    Cognition Comments Tabletop Scanning/Attention 78/80 accuracy, Environmental Scanning 9/15 accuracy, Trail Making Test B with increased time and 2 errors      Observation/Other Assessments   Focus on Therapeutic Outcomes (FOTO)  N/A      Sensation   Light Touch Appears Intact      Coordination   9 Hole Peg Test Right;Left    Right 9 Hole Peg Test 20.39s    Left 9 Hole Peg Test 22.87s      ROM / Strength   AROM / PROM / Strength AROM;Strength      AROM   Overall AROM  Within functional limits for tasks performed      Strength   Overall Strength Within functional limits for tasks performed      Hand Function   Right Hand Gross Grasp Functional    Right Hand Grip (lbs) 43.2    Left Hand Gross Grasp Functional    Left Hand Grip (lbs) 39.0                                         Plan - 04/08/21 1350     Clinical Impression Statement Pt is a 75 year old female that presents to Neuro OPOT with Altered Mental Status (AMS) and Transient Global Amnesia (TGA) on 03/24/21. Pt with PMH significant ofr pneumonia in 2017 and HTN. Pt reports that imaging revealed a benign meningioma present and has not been evaluated by neurosurgery yet. Pt presents with some deficits with attetion and awareness and is being evaluated by ST. Skilled occupational therapy is not recommended at this time.    OT Occupational Profile and History Problem Focused Assessment - Including review of records relating to presenting problem    Cognitive Skills Attention;Safety Awareness    Clinical Decision Making Limited treatment options, no task modification necessary    Comorbidities Affecting Occupational Performance: None    Modification or Assistance to Complete Evaluation  No modification of tasks or assist necessary to complete eval    OT Frequency One time visit    OT Treatment/Interventions Cognitive remediation/compensation             Patient will  benefit from skilled therapeutic intervention in order to improve the following deficits and impairments:     Cognitive Skills: Attention, Safety Awareness     Visit Diagnosis: Attention and concentration deficit    Problem List Patient Active Problem List  Diagnosis Date Noted   TGA (transient global amnesia) 03/25/2021   Cerebral aneurysm 03/25/2021   UTI (urinary tract infection), uncomplicated Q000111Q   Acute bronchitis 03/21/2021   Abnormal findings on diagnostic imaging of lung 09/22/2019   ILD (interstitial lung disease) ([place]) 09/22/2019   Allergic rhinitis 123XX123   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

## 2021-04-08 NOTE — Patient Instructions (Signed)
I found today that you may be having some difficulty with your attention, awareness of errors, and organization/planning - maybe just primarily on things that are new or that you haven't done in a while.   I recommend [employer] app for work at home on these things. IT is on the App store or the [employer]. It is free for 14 days, then they ask for a subscription fee. [place] is a code you can use for 15% off the subscription rate.

## 2021-04-12 DIAGNOSIS — I671 Cerebral aneurysm, nonruptured: Secondary | ICD-10-CM | POA: Diagnosis not present

## 2021-04-12 DIAGNOSIS — D329 Benign neoplasm of meninges, unspecified: Secondary | ICD-10-CM | POA: Diagnosis not present

## 2021-04-13 DIAGNOSIS — Z23 Encounter for immunization: Secondary | ICD-10-CM | POA: Diagnosis not present

## 2021-04-14 DIAGNOSIS — R350 Frequency of micturition: Secondary | ICD-10-CM | POA: Diagnosis not present

## 2021-04-18 ENCOUNTER — Other Ambulatory Visit: Payer: Self-pay | Admitting: Neurosurgery

## 2021-04-18 ENCOUNTER — Other Ambulatory Visit: Payer: Self-pay

## 2021-04-18 ENCOUNTER — Ambulatory Visit: Payer: Medicare Other | Admitting: Physical Therapy

## 2021-04-18 DIAGNOSIS — I671 Cerebral aneurysm, nonruptured: Secondary | ICD-10-CM

## 2021-04-18 DIAGNOSIS — M6281 Muscle weakness (generalized): Secondary | ICD-10-CM | POA: Diagnosis not present

## 2021-04-18 DIAGNOSIS — R2689 Other abnormalities of gait and mobility: Secondary | ICD-10-CM

## 2021-04-18 DIAGNOSIS — M25551 Pain in right hip: Secondary | ICD-10-CM

## 2021-04-18 DIAGNOSIS — R41841 Cognitive communication deficit: Secondary | ICD-10-CM | POA: Diagnosis not present

## 2021-04-18 DIAGNOSIS — R41840 Attention and concentration deficit: Secondary | ICD-10-CM | POA: Diagnosis not present

## 2021-04-18 NOTE — Therapy (Addendum)
[facility] [address] [place], [place], [address] Phone: [phone]   Fax:  [phone]  Physical Therapy Treatment/Re-Cert  Patient Details  Name: Ann [name] MRN: [id number] Date of Birth: 1945/12/10 Referring Provider (PT): Mariel Aloe, MD   Encounter Date: 04/18/2021   PT End of Session - 04/18/21 0928     Visit Number 2    Number of Visits 8    Date for PT Re-Evaluation [id number]   per cert on [id number]   Authorization Type Medicare    PT Start Time [id number]    PT Stop Time 0927    PT Time Calculation (min) 40 min    Activity Tolerance Patient tolerated treatment well    Behavior During Therapy [facility] for tasks assessed/performed             Past Medical History:  Diagnosis Date   Bronchitis, chronic ([place])    Hyperlipidemia    Hypertension    Pneumonia    Pulmonary nodule    Sepsis ([place])     Past Surgical History:  Procedure Laterality Date   CHOLECYSTECTOMY      There were no vitals filed for this visit.   Subjective Assessment - 04/18/21 0849     Subjective Went to the neurosurgeon last tuesday, want to schedule an angiogram, but haven't heard about scheduling yet. Has trouble walking due to pain in R leg, has been going on for 3-4 months. Pt reports pain starts in back and goes down the leg. Reports nothing really seems to help the leg pain. Standing and walking for an extended period makes it worse.    Pertinent History brain aneurysm and benign meningioma with UTI; intersitial lung disease    How long can you sit comfortably? n/a    How long can you stand comfortably? n/a    How long can you walk comfortably? 10-15 min now; was doing 7,000 steps in 2020    Patient Stated Goals Wants to return to wellness and walking    Currently in Pain? Yes    Pain Score 4     Pain Location Back   goes down the back of th eleg   Pain Orientation Right    Pain Descriptors / Indicators Pressure     Pain Type Chronic pain                [facility] PT Assessment - 04/18/21 0854       Assessment   Medical Diagnosis Transient Global Amnesia (TGA)    Referring Provider (PT) Mariel Aloe, MD    Onset Date/Surgical Date 03/24/21      ROM / Strength   AROM / PROM / Strength AROM      AROM   Overall AROM Comments more limited R hip flexion/IR compared to L    AROM Assessment Site Lumbar;Hip    Lumbar Flexion limited - approx 1 foot away from floor    Lumbar Extension limited, but reporting relief after 10 repeated extensions    Lumbar - Right Side Bend WFL   mild pain   Lumbar - Left Side Bend WFL   mild pain     Palpation   Palpation comment incr TTP to R IT band, R lumbar paraspinals      Special Tests    Special Tests Lumbar    Lumbar Tests Slump Test;Straight Leg Raise      Slump test   Findings Negative    Side --  both   Comment pt just reporting a stretch in back of R leg, no radicular sx      Straight Leg Raise   Findings Negative    Side  --   both   Comment reports incr cramps in L thigh, tightness in back of R leg, approx. 70 degrees on R                Access Code: [email or web address] URL: [email or web address] Date: 04/18/2021 Prepared by: Janann August   Initial HEP - see [place] for further details.   Exercises Supine Pelvic Tilt - 2 x daily - 5 x weekly - 1 sets - 10 reps Supine Lower Trunk Rotation - 2 x daily - 5 x weekly - 1 sets - 10 reps Hooklying Single Knee to Chest Stretch - 2 x daily - 5 x weekly - 2-3 sets - 30 hold Seated Hamstring Stretch - 2 x daily - 5 x weekly - 2-3 sets - 10 reps     Seated hip ADD pillow squeezes x10 reps, glute squeezes x10 reps.  Ambulated 115' x 1 at end of session with pt reporting RLE did not feel as stiff.                 PT Education - 04/18/21 0928     Education Details initial HEP    Person(s) Educated Patient    Methods Explanation;Demonstration;Verbal  cues;Handout    Comprehension Verbalized understanding;Returned demonstration                 PT Long Term Goals - 04/08/21 1430       PT LONG TERM GOAL #1   Title Pt will be independent with HEP to transition to community wellness programs    Time 4    Period Weeks    Status New    Target Date 05/06/21      PT LONG TERM GOAL #2   Title Pt will improve 6 MWT to at least 500 meters (1640') to be within her age norms    Baseline 457 meters (1500')    Time 4    Period Weeks    Status New    Target Date 05/06/21      PT LONG TERM GOAL #3   Title Pt will be able to walk at least 10 to 15 minutes daily    Baseline Currently not on home walking program    Time 4    Period Weeks    Status New    Target Date 05/06/21                   Plan - 04/18/21 1702     Clinical Impression Statement Performed further hip/low back assessment today due to reports of incr R hip/leg pain. No radicular sx noted with special tests, just reporting incr tightness of hamstrings. Noted decr ROM and incr stiffness with R hip flexion and IR/ER compared to LLE. Incr TTP to R IT band and lumbar paraspinals. Began pt on HEP today for low back and hamstring stretches/ROM. Pt reporting relief and feeling less tight at end of session. Will send new cert to include manual techniques in POC. Will continue to progress towards LTGs.    Personal Factors and Comorbidities Age    Examination-Activity Limitations Locomotion Level;Squat    Examination-Participation Restrictions Community Activity    Stability/Clinical Decision Making Stable/Uncomplicated    Rehab Potential Good    PT Frequency 2x / week  PT Duration 4 weeks    PT Treatment/Interventions Aquatic Therapy;Gait training;Stair training;Functional mobility training;Therapeutic activities;Therapeutic exercise;Balance training;Neuromuscular re-education;Patient/family education;Passive range of motion;Dry needling;Joint  Manipulations;Taping;Moist Heat;Cryotherapy;Electrical Stimulation;Manual techniques    PT Next Visit Plan . Work on general strengthening (focus on hips) and endurance. how is HEP? low back stretches, hamstring/IT band stretching    Consulted and Agree with Plan of Care Patient             Patient will benefit from skilled therapeutic intervention in order to improve the following deficits and impairments:  Decreased mobility, Decreased endurance, Pain, Decreased strength, Impaired flexibility, Increased fascial restricitons  Visit Diagnosis: Other abnormalities of gait and mobility  Pain in right hip  Muscle weakness (generalized)     Problem List Patient Active Problem List   Diagnosis Date Noted   TGA (transient global amnesia) 03/25/2021   Cerebral aneurysm 03/25/2021   UTI (urinary tract infection), uncomplicated Q000111Q   Acute bronchitis 03/21/2021   Abnormal findings on diagnostic imaging of lung 09/22/2019   ILD (interstitial lung disease) ([place]) 09/22/2019   Allergic rhinitis 123XX123   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 [place], [place], [address] Phone: [phone]   Fax:  [phone]  Name: Aamiya Mullikin MRN: [id number] Date of Birth: 20-Aug-1946

## 2021-04-18 NOTE — Patient Instructions (Signed)
Access Code: [email or web address] URL: [email or web address] Date: 04/18/2021 Prepared by: Janann August  Exercises Supine Pelvic Tilt - 2 x daily - 5 x weekly - 1 sets - 10 reps Supine Lower Trunk Rotation - 2 x daily - 5 x weekly - 1 sets - 10 reps Hooklying Single Knee to Chest Stretch - 2 x daily - 5 x weekly - 2-3 sets - 30 hold Seated Hamstring Stretch - 2 x daily - 5 x weekly - 2-3 sets - 10 reps

## 2021-04-18 NOTE — Addendum Note (Signed)
Addended by: Arliss Journey on: 04/18/2021 05:09 PM   Modules accepted: Orders

## 2021-04-19 ENCOUNTER — Ambulatory Visit: Payer: Medicare Other

## 2021-04-19 ENCOUNTER — Encounter: Payer: Self-pay | Admitting: Physical Therapy

## 2021-04-19 ENCOUNTER — Ambulatory Visit: Payer: Medicare Other | Admitting: Physical Therapy

## 2021-04-19 ENCOUNTER — Other Ambulatory Visit: Payer: Self-pay | Admitting: Neurosurgery

## 2021-04-19 DIAGNOSIS — R41841 Cognitive communication deficit: Secondary | ICD-10-CM | POA: Diagnosis not present

## 2021-04-19 DIAGNOSIS — R41840 Attention and concentration deficit: Secondary | ICD-10-CM | POA: Diagnosis not present

## 2021-04-19 DIAGNOSIS — M6281 Muscle weakness (generalized): Secondary | ICD-10-CM

## 2021-04-19 DIAGNOSIS — R2689 Other abnormalities of gait and mobility: Secondary | ICD-10-CM

## 2021-04-19 DIAGNOSIS — M25551 Pain in right hip: Secondary | ICD-10-CM | POA: Diagnosis not present
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
## 2021-04-19 NOTE — Therapy (Signed)
[facility] [address] [place], [place], [address] Phone: [phone]   Fax:  [phone]  Physical Therapy Treatment  Patient Details  Name: Ann [name] MRN: [id number] Date of Birth: October 06, 1945 Referring Provider (PT): Mariel Aloe, MD   Encounter Date: 04/19/2021   PT End of Session - 04/19/21 1543     Visit Number 3    Number of Visits 8    Date for PT Re-Evaluation Q000111Q   per cert on [id number]   Authorization Type Medicare    PT Start Time 1537   pt late from speech therapy   PT Stop Time 1616    PT Time Calculation (min) 39 min    Activity Tolerance Patient tolerated treatment well    Behavior During Therapy [facility] for tasks assessed/performed             Past Medical History:  Diagnosis Date   Bronchitis, chronic ([place])    Hyperlipidemia    Hypertension    Pneumonia    Pulmonary nodule    Sepsis ([place])     Past Surgical History:  Procedure Laterality Date   CHOLECYSTECTOMY      There were no vitals filed for this visit.   Subjective Assessment - 04/19/21 1539     Subjective Did the stretches at home and reports that they helped the stiffness. Felt more relief yesterday afternoon.    Pertinent History brain aneurysm and benign meningioma with UTI; intersitial lung disease    How long can you sit comfortably? n/a    How long can you stand comfortably? n/a    How long can you walk comfortably? 10-15 min now; was doing 7,000 steps in 2020    Patient Stated Goals Wants to return to wellness and walking    Currently in Pain? Yes    Pain Score 4     Pain Location Hip   down the leg   Pain Orientation Right    Pain Descriptors / Indicators Pressure    Pain Type Chronic pain    Aggravating Factors  standing too long    Pain Relieving Factors sitting in a firm chair                               [facility] Adult PT Treatment/Exercise - 04/19/21 1545        Ambulation/Gait   Ambulation/Gait Yes    Ambulation/Gait Assistance 6: Modified independent (Device/Increase time)    Ambulation Distance (Feet) 230 Feet    Assistive device None    Ambulation Surface Level;Indoor    Gait Comments at end of session, pt reporting RLE with less pain at end of session. pt has not been doing any walking at home. discussed pt beginning a gentle walking program working on level indoor surfaces and beginning at 3 minutes and working on 2-3 times a day to make sure it doesn't aggravate RLE and can gradually incr time.      Exercises   Exercises Knee/Hip;Other Exercises    Other Exercises  supine: R piriformis stetch 3 x 30 seconds with therapist providing slight overpressure, hip ADD butterfly stretch 2 x 30 seconds, single knee to chest stretch on R 2 x 30 seconds      Knee/Hip Exercises: Aerobic   Stepper SciFit with BLE/BUE for 5 minutes at gear 1.3 for strengthening/ROM and activity tolerance, reports feeling good with RLE. Pt reporting feeling good after  performing, pt to look into getting siler sneakers      Knee/Hip Exercises: Supine   Bridges Strengthening;AROM;2 sets;10 reps    Bridges Limitations x10 reps, an additional x10 reps with yellow tband with cues for hip ABD activation    Other Supine Knee/Hip Exercises 2 x 10 reps bent knee fall outs with yellow tband, cues for technique- added to pt's HEP    Other Supine Knee/Hip Exercises alternating marching x10 reps B with yellow tband, 2 x 10 reps clamshells with RLE                    PT Education - 04/19/21 1632     Education Details walking program, bent knee fall outs addition to HEP    Person(s) Educated Patient    Methods Explanation;Demonstration;Handout    Comprehension Verbalized understanding;Returned demonstration                 PT Long Term Goals - 04/08/21 1430       PT LONG TERM GOAL #1   Title Pt will be independent with HEP to transition to community wellness  programs    Time 4    Period Weeks    Status New    Target Date 05/06/21      PT LONG TERM GOAL #2   Title Pt will improve 6 MWT to at least 500 meters (1640') to be within her age norms    Baseline 457 meters (1500')    Time 4    Period Weeks    Status New    Target Date 05/06/21      PT LONG TERM GOAL #3   Title Pt will be able to walk at least 10 to 15 minutes daily    Baseline Currently not on home walking program    Time 4    Period Weeks    Status New    Target Date 05/06/21                   Plan - 04/19/21 1633     Clinical Impression Statement Today's skilled session continued to focus on proximal hip/core strengthening and R hip/lumbar stretching. Pt tolerated session well with reporting a decr in pain at the end of session during walking. Discussed pt starting a gentle walking program for home as pt is currently not doing any walking. Will continue to progress towards LTGs.    Personal Factors and Comorbidities Age    Examination-Activity Limitations Locomotion Level;Squat    Examination-Participation Restrictions Community Activity    Stability/Clinical Decision Making Stable/Uncomplicated    Rehab Potential Good    PT Frequency 2x / week    PT Duration 4 weeks    PT Treatment/Interventions Aquatic Therapy;Gait training;Stair training;Functional mobility training;Therapeutic activities;Therapeutic exercise;Balance training;Neuromuscular re-education;Patient/family education;Passive range of motion;Dry needling;Joint Manipulations;Taping;Moist Heat;Cryotherapy;Electrical Stimulation;Manual techniques    PT Next Visit Plan continue with scifit/nustep. Work on general strengthening (focus on hips) and endurance. how is HEP? low back stretches, hamstring/IT band stretching    Consulted and Agree with Plan of Care Patient             Patient will benefit from skilled therapeutic intervention in order to improve the following deficits and impairments:   Decreased mobility, Decreased endurance, Pain, Decreased strength, Impaired flexibility, Increased fascial restricitons  Visit Diagnosis: Pain in right hip  Other abnormalities of gait and mobility  Muscle weakness (generalized)     Problem List Patient Active Problem List  Diagnosis Date Noted   TGA (transient global amnesia) 03/25/2021   Cerebral aneurysm 03/25/2021   UTI (urinary tract infection), uncomplicated Q000111Q   Acute bronchitis 03/21/2021   Abnormal findings on diagnostic imaging of lung 09/22/2019   ILD (interstitial lung disease) ([place]) 09/22/2019   Allergic rhinitis 123XX123   Eosinophilic leukocytosis 123XX123   Hypertension 04/09/2019   Hyperlipidemia 04/09/2019   Chronic bronchitis ([place]) 04/09/2019    Arliss Journey, PT, DPT  04/19/2021, 4:34 PM  [facility] [address] [place], [place], [address] Phone: [phone]   Fax:  [phone]  Name: Ann [name] MRN: [id number] Date of Birth: [birth year]

## 2021-04-19 NOTE — Patient Instructions (Signed)
   Gavin Pound, MD (ENT)  [address]  [address]  [place], [place] [address]  [phone]

## 2021-04-19 NOTE — Patient Instructions (Signed)
Access Code: [email or web address] URL: [email or web address] Date: 04/19/2021 Prepared by: Janann August  Exercises Supine Pelvic Tilt - 2 x daily - 5 x weekly - 1 sets - 10 reps Supine Lower Trunk Rotation - 2 x daily - 5 x weekly - 1 sets - 10 reps Hooklying Single Knee to Chest Stretch - 2 x daily - 5 x weekly - 2-3 sets - 30 hold Seated Hamstring Stretch - 2 x daily - 5 x weekly - 2-3 sets - 10 reps Hooklying Single Leg Bent Knee Fallouts with Resistance - 1-2 x daily - 5 x weekly - 2 sets - 10 reps

## 2021-04-21 ENCOUNTER — Other Ambulatory Visit: Payer: Self-pay

## 2021-04-21 ENCOUNTER — Ambulatory Visit: Payer: Medicare Other

## 2021-04-21 DIAGNOSIS — R41840 Attention and concentration deficit: Secondary | ICD-10-CM | POA: Diagnosis not present

## 2021-04-21 DIAGNOSIS — R41841 Cognitive communication deficit: Secondary | ICD-10-CM

## 2021-04-21 DIAGNOSIS — M6281 Muscle weakness (generalized): Secondary | ICD-10-CM | POA: Diagnosis not present

## 2021-04-21 DIAGNOSIS — R2689 Other abnormalities of gait and mobility: Secondary | ICD-10-CM | POA: Diagnosis not present

## 2021-04-21 DIAGNOSIS — M25551 Pain in right hip: Secondary | ICD-10-CM | POA: Diagnosis not present

## 2021-04-21 NOTE — Therapy (Signed)
[facility] [address] [place], [place], [address] Phone: [phone]   Fax:  [phone]  Speech Language Pathology Treatment  Patient Details  Name: Ann [name] MRN: [id number] Date of Birth: 11-Sep-1945 Referring Provider (SLP): Cordelia Poche MD (ref); Marilynne Drivers, PA (documentation)   Encounter Date: 04/21/2021   End of Session - 04/21/21 1250     Visit Number 3    Number of Visits 17    Date for SLP Re-Evaluation 06/17/21    SLP Start Time 1233    SLP Stop Time  1315    SLP Time Calculation (min) 42 min    Activity Tolerance Patient tolerated treatment well             Past Medical History:  Diagnosis Date   Bronchitis, chronic ([place])    Hyperlipidemia    Hypertension    Pneumonia    Pulmonary nodule    Sepsis ([place])     Past Surgical History:  Procedure Laterality Date   CHOLECYSTECTOMY      There were no vitals filed for this visit.   Subjective Assessment - 04/21/21 1437     Subjective "I can't find the bathmat that I put on the bed"    Currently in Pain? Yes    Pain Score 3     Pain Location Leg                   ADULT SLP TREATMENT - 04/21/21 1248       General Information   Behavior/Cognition Alert;Cooperative;Pleasant mood      Treatment Provided   Treatment provided Cognitive-Linquistic      Cognitive-Linquistic Treatment   Treatment focused on Cognition    Skilled Treatment Pt recalled portions of CLQT completed last session, with good performance reported. SLP reviewed observed deficits as outlined in last ST note. No overt cognitive challenges reported at home, other than pt unable to find son's bathmat she washed and needed to return. Pt checked multiple areas. Pt also recalled situation in which she threw medicine bottle away. Pt is currently reading at home for engagmenet, but desires to become more involved in community for her well-being. SLP targeted  attention to detail task, in which pt read instructions, clarified, and selected appropriate items. Pt noted to double check selections without prompt. SLP provided min verbal prompt to calculate total as that was part of the task. Of note, pt noted to do mental calculations and stated "I think I'm over." SLP prompted pt to write out calculations to ensure accuracy. Discrepancies noted for written calculations versus calculator. SLP assisted patient with functional problem solving to ID and correct errors x3. Pt independently requested visual aid to assist with following line by line. Pt reports bill are auto-drafted and medicines are managed in pill organizer without errors.      Assessment / Recommendations / Plan   Plan Continue with current plan of care      Progression Toward Goals   Progression toward goals Progressing toward goals              SLP Education - 04/21/21 1306     Education Details double check, error awareness    Person(s) Educated Patient    Methods Explanation;Demonstration;Handout    Comprehension Verbalized understanding;Returned demonstration;Need further instruction              SLP Short Term Goals - 04/21/21 1250       SLP SHORT TERM  GOAL #1   Title pt will complete objective cognitive assessment    Period --   or 2 sessions   Status Achieved      SLP SHORT TERM GOAL #2   Title pt will demo WFL alternating attention between two mod complex tasks in 2 sessions    Time 4    Period Weeks    Status On-going    Target Date 05/13/21   pt starts ST in two weeks     SLP [place] #3   Title pt will demo organization of household tasks/to-do list/daily tasks with copmensatory strategies in 3 visits    Time 4    Status On-going    Target Date 05/13/21      SLP SHORT TERM GOAL #4   Title pt will double check her work in all therapy tasks in 3 sessions    Baseline 04-21-21    Time 4    Period Weeks    Status On-going    Target Date 05/13/21       SLP SHORT TERM GOAL #5   Title pt will plan a simple day outing or the like, with rare min A    Time 4    Period Weeks    Status On-going    Target Date 05/13/21              SLP Long Term Goals - 04/21/21 1251       SLP LONG TERM GOAL #1   Title pt will demo functional divided attention in conversation in two simple cognitive tasks in 3 sessions    Time 8    Period Weeks    Status On-going    Target Date 06/10/21      SLP LONG TERM GOAL #2   Title pt will demo knowledge of ways her cognitive lingistic deficits may impact her daily life in 3 sessions    Time 8    Period Weeks    Status On-going    Target Date 06/10/21      SLP LONG TERM GOAL #3   Title pt will plan a 3-day vacation with min extra time    Time 8    Period Weeks    Status On-going    Target Date 06/10/21      SLP LONG TERM GOAL #4   Title pt will utilize some kind of memory enhancement system for to-do lists, MD questions, etc (not med tracking or appointment tracking - pt already performing these WNL with mod I)    Time 8    Period Weeks    Status On-going    Target Date 06/10/21              Plan - 04/21/21 1250     Clinical Impression Statement Ann [name] presents today reporting she feels "slower" than before her hsopitalization of global transient amnesia, and incidental findings of probable 48m dural-based meningioma (likely benign) in rt lateral temporal calvarium, and a 5-6 mm aneurysm. SLP targeted attention to detail and problem solving task, with occasional min A required to aid problem solving and error awareness as task became more complex. For the current time, skilled ST is necessary for improving pt's mild cognitive deficits in memory, executve function, and attention. As SLP and pt get more familiar with each other therapy may be adjusted to reflect any revelations about pt's behavior pre-hospitalization.    Speech Therapy Frequency 2x / week    Duration 8 weeks  Treatment/Interventions Environmental controls;Compensatory techniques;Functional tasks;SLP instruction and feedback;Cueing hierarchy;Cognitive reorganization;Patient/family education;Internal/external aids    Potential to Achieve Goals Good    Consulted and Agree with Plan of Care Patient             Patient will benefit from skilled therapeutic intervention in order to improve the following deficits and impairments:   Cognitive communication deficit    Problem List Patient Active Problem List   Diagnosis Date Noted   TGA (transient global amnesia) 03/25/2021   Cerebral aneurysm 03/25/2021   UTI (urinary tract infection), uncomplicated Q000111Q   Acute bronchitis 03/21/2021   Abnormal findings on diagnostic imaging of lung 09/22/2019   ILD (interstitial lung disease) ([place]) 09/22/2019   Allergic rhinitis 123XX123   Eosinophilic leukocytosis 123XX123   Hypertension 04/09/2019   Hyperlipidemia 04/09/2019   Chronic bronchitis ([place]) 04/09/2019    Alinda Deem, MA CCC-SLP 04/21/2021, 2:38 PM  [facility] [address] [place], [place], [address] Phone: [phone]   Fax:  [phone]   Name: Ann [name] MRN: [id number] Date of Birth: 08-05-1946

## 2021-04-22 ENCOUNTER — Other Ambulatory Visit: Payer: Self-pay | Admitting: Neurosurgery

## 2021-04-26 ENCOUNTER — Ambulatory Visit (HOSPITAL_COMMUNITY)
Admission: RE | Admit: 2021-04-26 | Discharge: 2021-04-26 | Disposition: A | Discharge status: Home / Self Care | Payer: Medicare Other | Source: Ambulatory Visit | Attending: Neurosurgery | Admitting: Neurosurgery

## 2021-04-26 ENCOUNTER — Other Ambulatory Visit: Payer: Self-pay | Admitting: Neurosurgery

## 2021-04-26 ENCOUNTER — Other Ambulatory Visit: Payer: Self-pay

## 2021-04-26 DIAGNOSIS — I671 Cerebral aneurysm, nonruptured: Secondary | ICD-10-CM | POA: Insufficient documentation

## 2021-04-26 DIAGNOSIS — Z7951 Long term (current) use of inhaled steroids: Secondary | ICD-10-CM | POA: Diagnosis not present

## 2021-04-26 DIAGNOSIS — Z87891 Personal history of nicotine dependence: Secondary | ICD-10-CM | POA: Insufficient documentation

## 2021-04-26 DIAGNOSIS — G454 Transient global amnesia: Secondary | ICD-10-CM | POA: Insufficient documentation

## 2021-04-26 DIAGNOSIS — Z79899 Other long term (current) drug therapy: Secondary | ICD-10-CM | POA: Diagnosis not present

## 2021-04-26 HISTORY — PX: IR ANGIO INTRA EXTRACRAN SEL INTERNAL CAROTID BILAT MOD SED: IMG5363

## 2021-04-26 HISTORY — PX: IR ANGIO VERTEBRAL SEL VERTEBRAL BILAT MOD SED: IMG5369

## 2021-04-26 LAB — BASIC METABOLIC PANEL
Anion gap: 7 (ref 5–15)
BUN: 12 mg/dL (ref 8–23)
CO2: 27 mmol/L (ref 22–32)
Calcium: 9.2 mg/dL (ref 8.9–10.3)
Chloride: 102 mmol/L (ref 98–111)
Creatinine, Ser: 0.67 mg/dL (ref 0.44–1.00)
GFR, Estimated: >60 mL/min (ref >60)
Glucose, Bld: 99 mg/dL (ref 70–99)
Potassium: 4.3 mmol/L (ref 3.5–5.1)
Sodium: 136 mmol/L (ref 135–145)

## 2021-04-26 LAB — URINALYSIS, ROUTINE W REFLEX MICROSCOPIC
Bilirubin Urine: NEGATIVE
Glucose, UA: NEGATIVE mg/dL
Hgb urine dipstick: NEGATIVE
Ketones, ur: NEGATIVE mg/dL
Nitrite: NEGATIVE
Protein, ur: NEGATIVE mg/dL
Specific Gravity, Urine: 1.012 (ref 1.005–1.030)
pH: 6 (ref 5.0–8.0)

## 2021-04-26 LAB — CBC WITH DIFFERENTIAL/PLATELET
Abs Immature Granulocytes: 0.02 10*3/uL (ref 0.00–0.07)
Basophils Absolute: 0.1 10*3/uL (ref 0.0–0.1)
Basophils Relative: 1 %
Eosinophils Absolute: 0.4 10*3/uL (ref 0.0–0.5)
Eosinophils Relative: 4 %
HCT: 41.2 % (ref 36.0–46.0)
Hemoglobin: 13.8 g/dL (ref 12.0–15.0)
Immature Granulocytes: 0 %
Lymphocytes Relative: 18 %
Lymphs Abs: 1.4 10*3/uL (ref 0.7–4.0)
MCH: 30.7 pg (ref 26.0–34.0)
MCHC: 33.5 g/dL (ref 30.0–36.0)
MCV: 91.6 fL (ref 80.0–100.0)
Monocytes Absolute: 0.5 10*3/uL (ref 0.1–1.0)
Monocytes Relative: 7 %
Neutro Abs: 5.7 10*3/uL (ref 1.7–7.7)
Neutrophils Relative %: 70 %
Platelets: 262 10*3/uL (ref 150–400)
RBC: 4.5 MIL/uL (ref 3.87–5.11)
RDW: 13.7 % (ref 11.5–15.5)
WBC: 8.1 10*3/uL (ref 4.0–10.5)
nRBC: 0 % (ref 0.0–0.2)

## 2021-04-26 LAB — PROTIME-INR
INR: 1.1 (ref 0.8–1.2)
Prothrombin Time: 13.7 seconds (ref 11.4–15.2)

## 2021-04-26 LAB — APTT: aPTT: 31 seconds (ref 24–36)

## 2021-04-26 IMAGING — XA IR CAROTID INTERNAL HEAD/NECK BILAT  (MS)
9 of 10 series · 12 of 24 positions shown · IV contrast (IODINE)
Comparison: none

PROCEDURE:
DIAGNOSTIC CEREBRAL ANGIOGRAM
HISTORY: The patient is a 75-year-old woman previously seen an the emergency
department with altered mental status and confusion ultimately
diagnosed with transient global amnesia. Her workup included MRI and
MRA of the head which incidentally discovered a left middle cerebral
artery aneurysm. Patient was seen in the Outpatient neurosurgery
clinic and referred for diagnostic cerebral angiogram for further
workup.
TECHNIQUE: CATHETERS AND WIRES
5-French JB-1 catheter

[Series 1: cerebral care 2 · 2 acquisitions, 1 frame shown (1 of 8)]
[im 1/2]
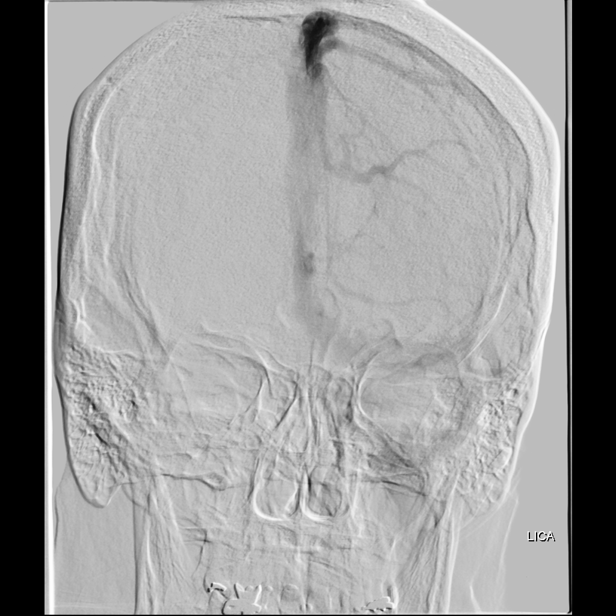

[Series 2: cerebral care 2 · 2 acquisitions, 2 frames shown (2 of 8)]
[im 1/2]
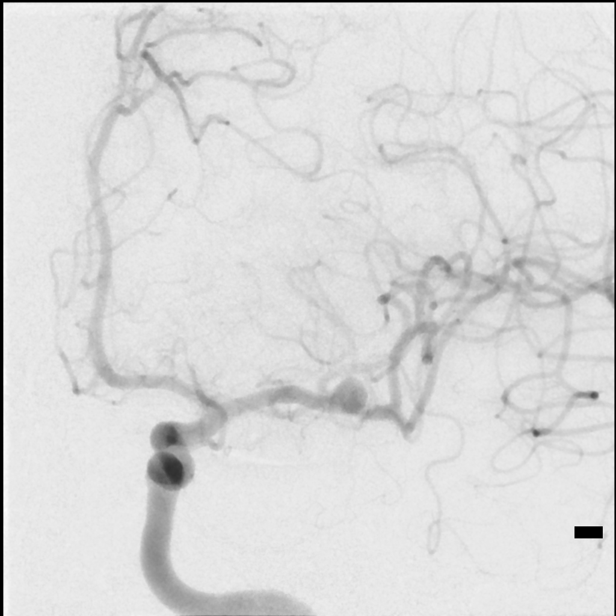
[im 2/2]
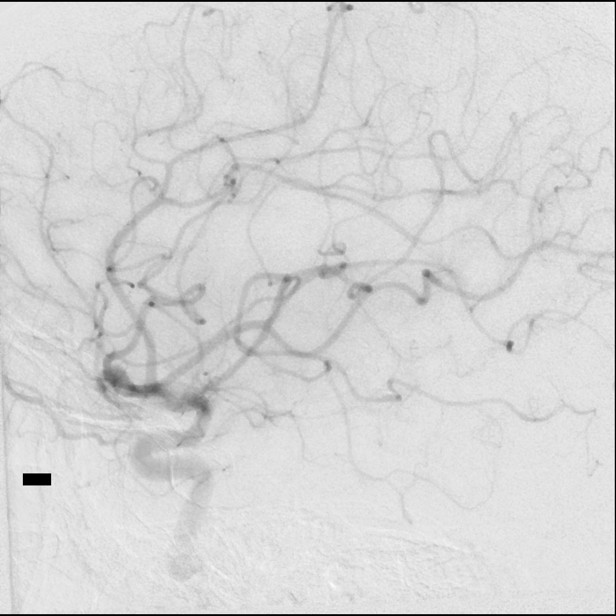

[Series 3: cerebral care 2 · 2 acquisitions, 1 frame shown (3 of 8)]
[im 1/2]
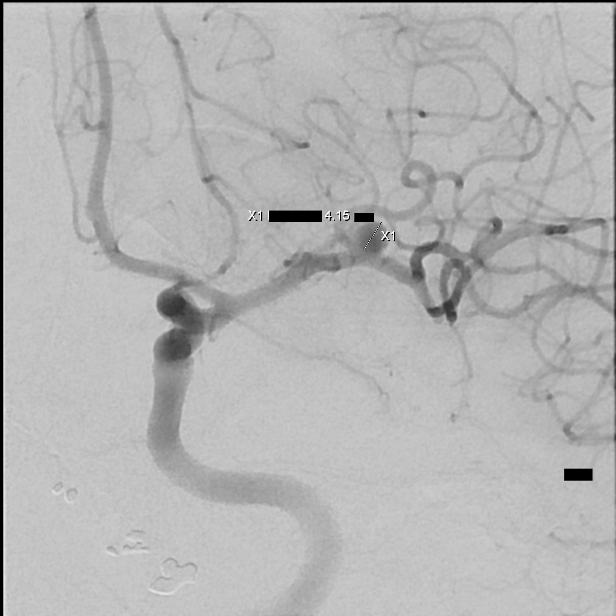

[Series 4: cerebral care 2 · 1 of 10 frames shown (4 of 8)]
[frame 9/10]
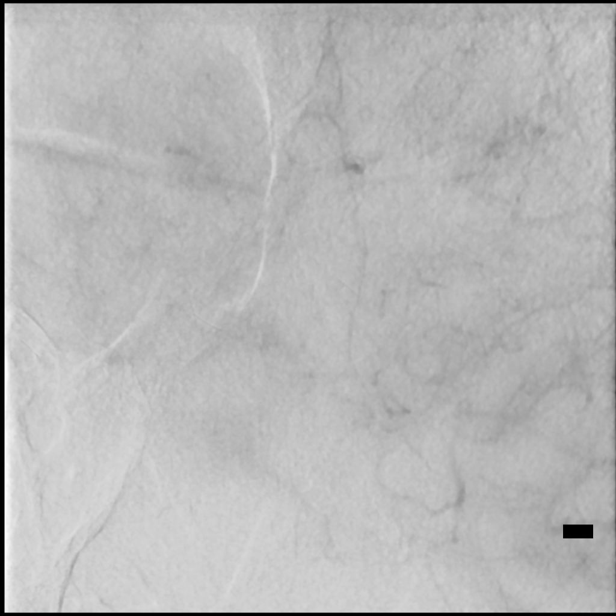

[Series 5: cerebral care 2 · 2 acquisitions, 1 frame shown (5 of 8)]
[im 1/2]
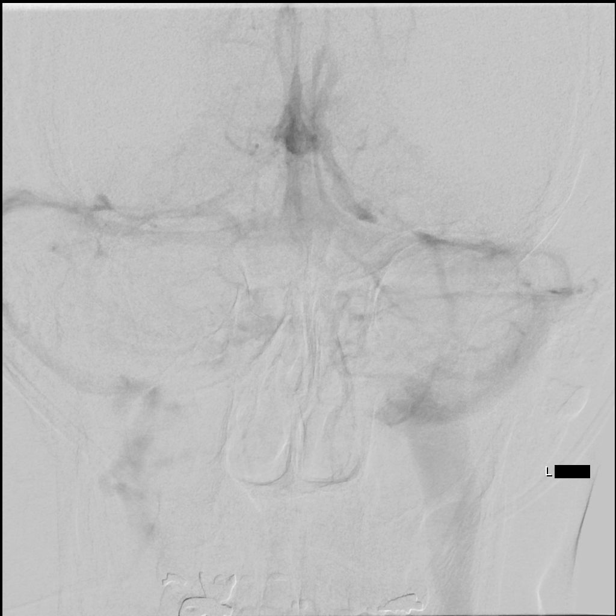

[Series 7: cerebral care 2 · 2 acquisitions, 1 frame shown (6 of 8)]
[im 1/2]
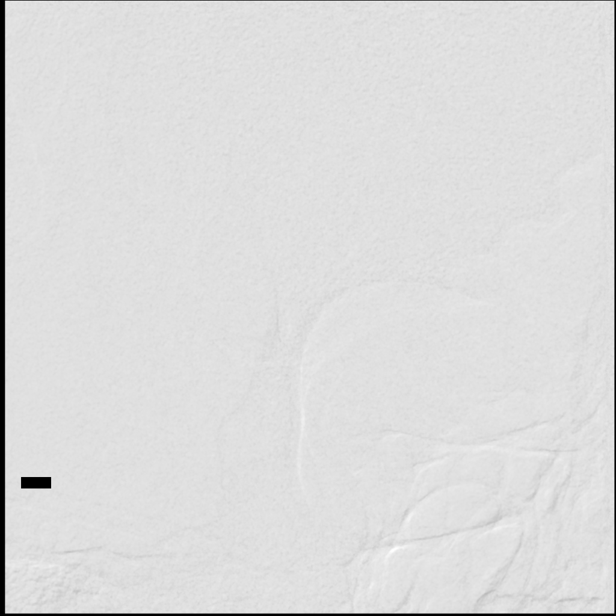

[Series 8: cerebral care 2 · 2 acquisitions, 1 frame shown (7 of 8)]
[im 1/2]
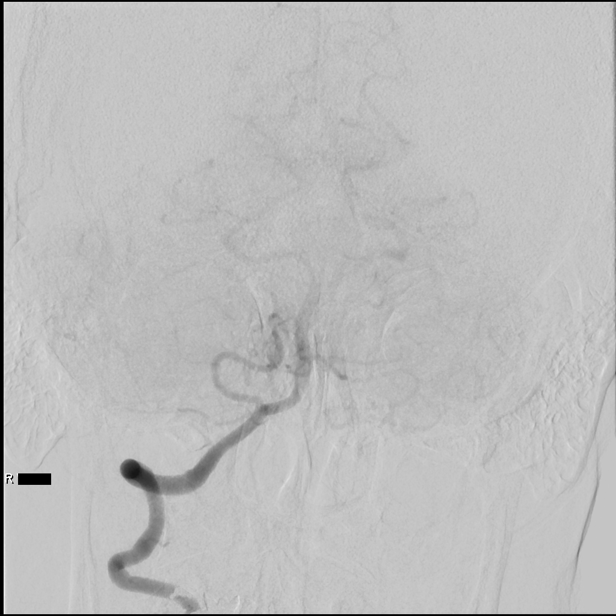

[Series 9: cerebral care 2 · 2 acquisitions, 1 frame shown (8 of 8)]
[im 1/2]
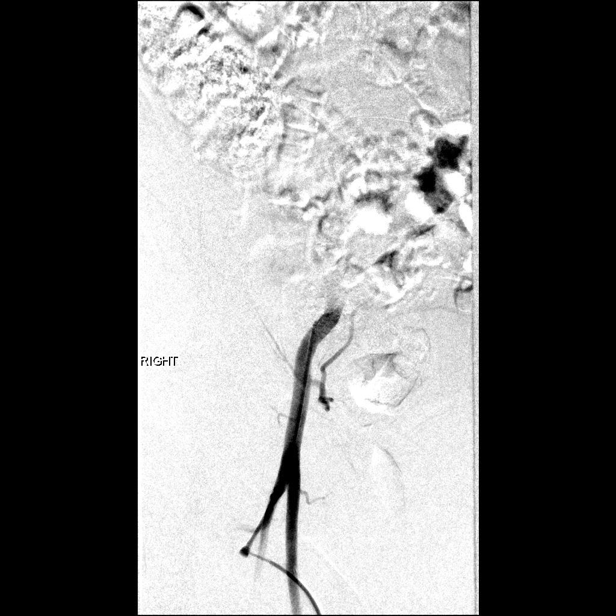

[Series 300: dr. (person_name) · 3 of 19 slices shown]
[im 5/19]
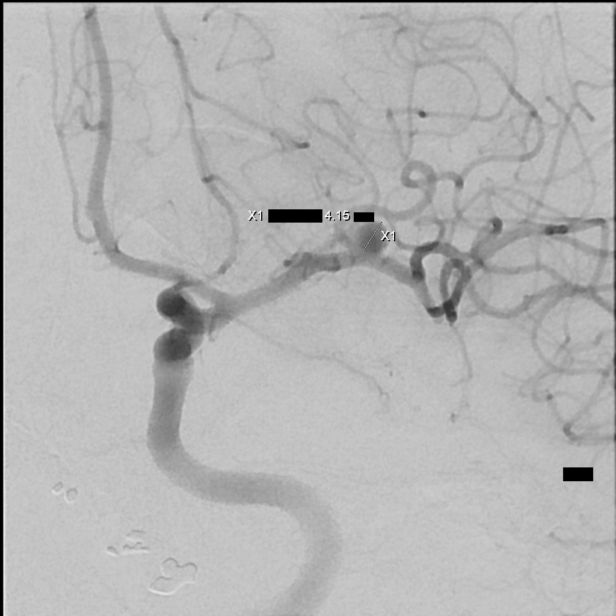
[im 11/19]
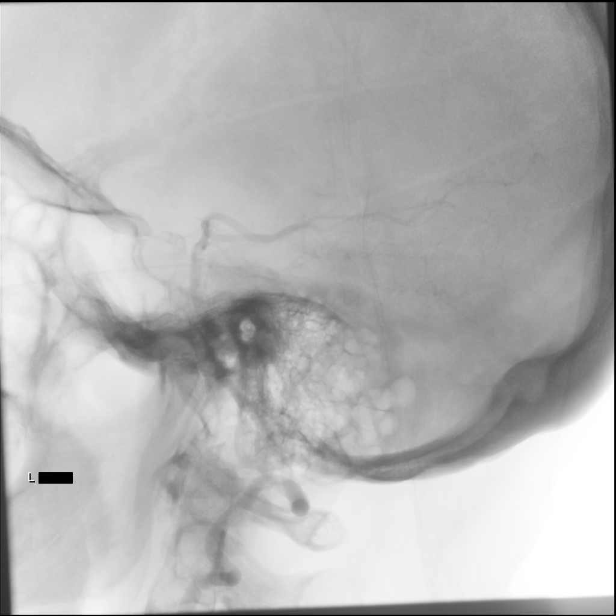
[im 19/19]
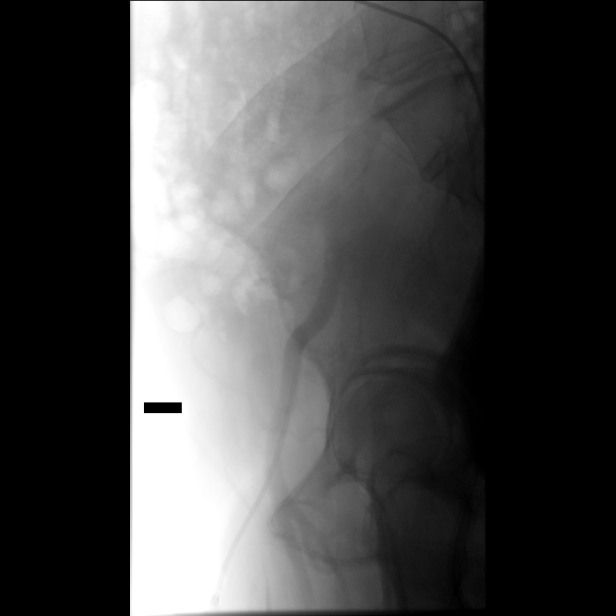

[12 of 24 positions shown; findings below may reference images not displayed]

ACCESS:
The technical aspects of the procedure as well as its potential
risks and benefits were reviewed with the patient. These risks
included but were not limited bleeding, infection, allergic
reaction, damage to organs or vital structures, stroke,
non-diagnostic procedure, and the catastrophic outcomes of heart
attack, coma, and death. With an understanding of these risks,
informed consent was obtained and witnessed. The patient was placed
in the supine position on the angiography table and the skin of
right groin prepped in the usual sterile fashion.

The procedure was performed under local anesthesia (1%-solution of
bicarbonate-buffered Lidocaine) and conscious sedation with 1mg
versed and [IA] fentanyl monitored by myself and the
in-suite nurse using continuous pulse-oximetry, heart rate, and
non-invasive blood-pressure.

A 5- French sheath was introduced in the right common femoral artery
using Seldinger technique. A fluoro-phase sequence was used to
document the sheath position.

MEDICATIONS:
HEPARIN: [IA] Units total.

CONTRAST:  36mL OMNIPAQUE IOHEXOL 240 MG/ML SOLNcc, Omnipaque 300

FLUOROSCOPY TIME:  FLUOROSCOPY TIME: See IR records
0.035" glidewire

VESSELS CATHETERIZED
Right internal carotid

Left internal carotid

Left vertebral

Right vertebral

Right common femoral

VESSELS STUDIED
Right internal carotid, head

Left internal carotid, head

Left vertebral

Right vertebral

Right femoral

PROCEDURAL NARRATIVE
A 5-Fr JB-1 catheter was advanced over a 0.035 glidewire into the
aortic arch. The above vessels were then sequentially catheterized
and cervical / cerebral angiograms taken. After review of images,
the catheter was removed without incident.
FINDINGS: Right internal carotid, head:

Injection reveals the presence of a widely patent ICA, M1, and A1
segments and their branches. No aneurysms, AVMs, or high-flow
fistulas are seen. The parenchymal and venous phases are normal. The
right transverse sinus is hypoplastic, the remainder of the venous
sinuses are widely patent.

Left internal carotid, head:

Injection reveals the presence of a widely patent ICA, A1, and M1
segments and their branches. There is an aneurysm arising from the
left middle cerebral artery bifurcation projecting superiorly and
laterally. Aneurysm is relatively wide necked, measuring
approximately 5.8 mm tall by 6.2 mm wide. Neck of the aneurysm
measures approximately 4 mm. Aneurysm appears to arise at the
bifurcation of the horizontal segment of the MCA. The parenchymal
and venous phases are normal. The venous sinuses are unremarkable.

Left vertebral:

Injection reveals the presence of a widely patent vertebral artery.
This leads to a widely patent basilar artery that terminates in
bilateral P1. The basilar apex is normal. No aneurysms, AVMs, or
high-flow fistulas are seen. The parenchymal and venous phases are
normal.

Right vertebral:

The vertebral artery is widely patent. No PICA aneurysm is seen. See
basilar description above.

Right femoral:

Normal vessel. No significant atherosclerotic disease. Arterial
sheath in adequate position.

DISPOSITION:
Upon completion of the study, the femoral sheath was removed and
hemostasis obtained using a 5-Fr ExoSeal closure device. Good
proximal and distal lower extremity pulses were documented upon
achievement of hemostasis. The procedure was well tolerated and no
early complications were observed. The patient was transferred to
the holding area to lay flat for 2 hours.
IMPRESSION: 1. Wide-necked left middle cerebral artery bifurcation aneurysm
measuring approximately 6 mm as described above.

2. No other intracranial aneurysms, arteriovenous malformations, or
high-flow fistulas are seen.

The preliminary results of this procedure were shared with the
patient and the patient's family.

## 2021-04-26 MED ORDER — FENTANYL CITRATE (PF) 100 MCG/2ML IJ SOLN
INTRAMUSCULAR | Status: AC | PRN
  Administered 2021-04-26: 25 ug via INTRAVENOUS

## 2021-04-26 MED ORDER — LIDOCAINE HCL 1 % IJ SOLN
INTRAMUSCULAR | Status: AC
  Filled 2021-04-26: qty 20

## 2021-04-26 MED ORDER — SODIUM CHLORIDE 0.9 % IV SOLN: INTRAVENOUS | Status: DC

## 2021-04-26 MED ORDER — HEPARIN SODIUM (PORCINE) 1000 UNIT/ML IJ SOLN
INTRAMUSCULAR | Status: AC | PRN
  Administered 2021-04-26: 2000 [IU] via INTRAVENOUS

## 2021-04-26 MED ORDER — FENTANYL CITRATE (PF) 100 MCG/2ML IJ SOLN
INTRAMUSCULAR | Status: AC
  Filled 2021-04-26: qty 4

## 2021-04-26 MED ORDER — IOHEXOL 240 MG/ML SOLN
100.0000 mL | Freq: Once | INTRAMUSCULAR | Status: AC | PRN
  Administered 2021-04-26: 36 mL via INTRAVENOUS

## 2021-04-26 MED ORDER — CHLORHEXIDINE GLUCONATE CLOTH 2 % EX PADS: 6.0000 | MEDICATED_PAD | Freq: Once | CUTANEOUS | Status: DC

## 2021-04-26 MED ORDER — LIDOCAINE HCL (PF) 1 % IJ SOLN
INTRAMUSCULAR | Status: AC | PRN
  Administered 2021-04-26: 8 mL

## 2021-04-26 MED ORDER — CEFAZOLIN SODIUM-DEXTROSE 2-4 GM/100ML-% IV SOLN: 2.0000 g | INTRAVENOUS | Status: DC

## 2021-04-26 MED ORDER — HYDROCODONE-ACETAMINOPHEN 5-325 MG PO TABS: 1.0000 | ORAL_TABLET | ORAL | Status: DC | PRN

## 2021-04-26 MED ORDER — MIDAZOLAM HCL 2 MG/2ML IJ SOLN
INTRAMUSCULAR | Status: AC
  Filled 2021-04-26: qty 4

## 2021-04-26 MED ORDER — MIDAZOLAM HCL 2 MG/2ML IJ SOLN
INTRAMUSCULAR | Status: AC | PRN
  Administered 2021-04-26: 1 mg via INTRAVENOUS

## 2021-04-26 MED ORDER — HEPARIN SODIUM (PORCINE) 1000 UNIT/ML IJ SOLN
INTRAMUSCULAR | Status: AC
  Filled 2021-04-26: qty 1

## 2021-04-26 NOTE — Progress Notes (Signed)
Discharge instructions reviewed with pt and her husband. Both voice understanding.  

## 2021-04-26 NOTE — Brief Op Note (Signed)
  NEUROSURGERY BRIEF OPERATIVE  NOTE   PREOP DX: Aneurysm  POSTOP DX: Same  PROCEDURE: Diagnostic cerebral angiogram  SURGEON: Dr. Consuella Lose, MD  ANESTHESIA: IV Sedation with Local  EBL: Minimal  SPECIMENS: None  COMPLICATIONS: None  CONDITION: Stable to recovery  FINDINGS (Full report in [facility]): 1. ~51m LMCA aneurysm 2. No other aneurysms, AVM, or fistulas seen.   NConsuella Lose MD [facility]

## 2021-04-26 NOTE — Progress Notes (Signed)
Ambulated in hallway and to the bathroom to void. Tol well no bleeding noted before or after ambulation.

## 2021-04-26 NOTE — H&P (Signed)
Chief Complaint  Aneurysm  History of Present Illness  Ann [name] is a 75 y.o. female recently admitted to the emergency department with confusion and amnesia.  She underwent extensive work-up including brain imaging and ultimately was diagnosed with transient global amnesia.  Work-up also revealed incidentally, left middle cerebral artery aneurysm measuring at least 5 to 6 mm.  She was seen in the outpatient neurosurgery clinic and referred for further work-up with diagnostic cerebral angiogram.  Past Medical History   Past Medical History:  Diagnosis Date   Bronchitis, chronic (HCC)    Hyperlipidemia    Hypertension    Pneumonia    Pulmonary nodule    Sepsis ([place])     Past Surgical History   Past Surgical History:  Procedure Laterality Date   CHOLECYSTECTOMY      Social History   Social History   Tobacco Use   Smoking status: Former    Packs/day: 0.10    Years: 12.00    Pack years: 1.20    Types: Cigarettes    Start date: 1964    Quit date: 1976    Years since quitting: 46.6   Smokeless tobacco: Never   Tobacco comments:    during smoking years only smoked ocassionally  Vaping Use   Vaping Use: Never used  Substance Use Topics   Alcohol use: Yes   Drug use: Never    Medications   Prior to Admission medications   Medication Sig Start Date End Date Taking? Authorizing Provider  acetaminophen (TYLENOL) 500 MG tablet Take 1,000 mg by mouth every 6 (six) hours as needed for mild pain.   Yes [provider]  albuterol (VENTOLIN HFA) 108 (90 Base) MCG/ACT inhaler Inhale 1-2 puffs into the lungs every 6 (six) hours as needed for wheezing or shortness of breath. 11/10/19  Yes Margaretha Seeds, MD  atorvastatin (LIPITOR) 20 MG tablet Take 20 mg by mouth daily. 12/26/18  Yes [provider]  cetirizine (ZYRTEC) 10 MG tablet Take 10 mg by mouth daily.   Yes [provider]  Cholecalciferol (VITAMIN D3) 50 MCG (2000 UT) TABS Take 1  tablet by mouth daily.   Yes [provider]  fluticasone-salmeterol (ADVAIR HFA) 230-21 MCG/ACT inhaler Inhale 2 puffs into the lungs 2 (two) times daily. 10/18/20  Yes Margaretha Seeds, MD  ibuprofen (ADVIL) 200 MG tablet Take 400 mg by mouth every 6 (six) hours as needed for moderate pain.   Yes [provider]  lisinopril-hydrochlorothiazide (ZESTORETIC) 20-25 MG tablet Take 1 tablet by mouth daily. 12/26/18  Yes [provider]  estradiol (ESTRACE) 0.1 MG/GM vaginal cream Place 0.5 g vaginally 2 (two) times a week. Patient not taking: No sig reported 11/28/18   [provider]  meloxicam (MOBIC) 15 MG tablet Take 15 mg by mouth daily. Patient not taking: Reported on 04/08/2021 06/01/20   [provider]    Allergies  No Known Allergies  Review of Systems  ROS  Neurologic Exam  Awake, alert, oriented Memory and concentration grossly intact Speech fluent, appropriate CN grossly intact Motor exam: Upper Extremities Deltoid Bicep Tricep Grip  Right 5/5 5/5 5/5 5/5  Left 5/5 5/5 5/5 5/5   Lower Extremities IP Quad PF DF EHL  Right 5/5 5/5 5/5 5/5 5/5  Left 5/5 5/5 5/5 5/5 5/5   Sensation grossly intact to LT  Imaging  MRA of the brain was personally reviewed and does demonstrate approximately 5 to 6 mm laterally projecting left  middle cerebral artery bifurcation aneurysm.  Impression  - 75 y.o. female with incidentally discovered relatively large left middle cerebral artery aneurysm  Plan  -We will proceed with diagnostic cerebral angiogram  I have reviewed the indications for the procedure as well as the expected postoperative course and recovery.  We have discussed the associated risks, benefits, and alternatives to the procedure.  All her questions today were answered.  She provided informed consent to proceed.  Consuella Lose, MD [facility]

## 2021-04-27 ENCOUNTER — Ambulatory Visit: Payer: Medicare Other | Admitting: Physical Therapy

## 2021-04-28 ENCOUNTER — Ambulatory Visit: Payer: Medicare Other | Admitting: Physical Therapy

## 2021-04-28 ENCOUNTER — Other Ambulatory Visit: Payer: Self-pay

## 2021-04-28 ENCOUNTER — Ambulatory Visit: Payer: Medicare Other

## 2021-04-28 DIAGNOSIS — R2689 Other abnormalities of gait and mobility: Secondary | ICD-10-CM | POA: Diagnosis not present

## 2021-04-28 DIAGNOSIS — R41841 Cognitive communication deficit: Secondary | ICD-10-CM

## 2021-04-28 DIAGNOSIS — R41840 Attention and concentration deficit: Secondary | ICD-10-CM | POA: Diagnosis not present

## 2021-04-28 DIAGNOSIS — M25551 Pain in right hip: Secondary | ICD-10-CM | POA: Diagnosis not present

## 2021-04-28 DIAGNOSIS — M6281 Muscle weakness (generalized): Secondary | ICD-10-CM | POA: Diagnosis not present

## 2021-04-28 NOTE — Therapy (Signed)
[facility] [address] [place], [place], [address] Phone: [phone]   Fax:  [phone]  Speech Language Pathology Treatment  Patient Details  Name: Ann [name] MRN: [id number] Date of Birth: 18-Mar-1946 Referring Provider (SLP): Cordelia Poche MD (ref); Marilynne Drivers, PA (documentation)   Encounter Date: 04/28/2021   End of Session - 04/28/21 1537     Visit Number 4    Number of Visits 17    Date for SLP Re-Evaluation 06/17/21    SLP Start Time 1319    SLP Stop Time  1400    SLP Time Calculation (min) 41 min    Activity Tolerance Patient tolerated treatment well             Past Medical History:  Diagnosis Date   Bronchitis, chronic ([place])    Hyperlipidemia    Hypertension    Pneumonia    Pulmonary nodule    Sepsis ([place])     Past Surgical History:  Procedure Laterality Date   CHOLECYSTECTOMY     IR ANGIO INTRA EXTRACRAN SEL INTERNAL CAROTID BILAT MOD SED  04/26/2021   IR ANGIO VERTEBRAL SEL VERTEBRAL BILAT MOD SED  04/26/2021    There were no vitals filed for this visit.   Subjective Assessment - 04/28/21 1328     Subjective "I baked zucchini bread and cookies this weekend."                   ADULT SLP TREATMENT - 04/28/21 1525       General Information   Behavior/Cognition Alert;Cooperative;Pleasant mood      Treatment Provided   Treatment provided Cognitive-Linquistic      Cognitive-Linquistic Treatment   Treatment focused on Cognition    Skilled Treatment Pt cont to report no alarming cognitive differences at home amidst her daily routine. In therapy today SLP targeted attention/concentration with verbal expression task and conversation - pt able to perform WNL with simple divided attention. SLP noted pt to consistently spontaneously double check her answers. Pt told SLP her planning of household tasks seems like it is at baseline. Pt stated she was 'at 80%" and when SLP  asked pt what would make the 20%increase to bring her more back to baseline Ann [name] told SLP about her physical deficits. SLP pressed pt further about cognitive changes and pt again reported she felt like she was primarily at baseline, cognitively.      Assessment / Recommendations / Plan   Plan Other (Comment)   decr to x1/week bsaed upon performance today and on pt report     Progression Toward Goals   Progression toward goals Progressing toward goals                SLP Short Term Goals - 04/28/21 1336       SLP SHORT TERM GOAL #1   Title pt will complete objective cognitive assessment    Period --   or 2 sessions   Status Achieved      SLP SHORT TERM GOAL #2   Title pt will demo WFL alternating attention between two mod complex tasks in 2 sessions    Baseline 04-28-21    Time 4    Period Weeks    Status On-going    Target Date 05/13/21   pt starts ST in two weeks     SLP SHORT TERM GOAL #3   Title pt will demo organization of household tasks/to-do list/daily tasks with copmensatory strategies in  3 visits    Baseline 04-28-21    Time 4    Status On-going    Target Date 05/13/21      SLP SHORT TERM GOAL #4   Title pt will double check her work in all therapy tasks in 3 sessions    Baseline 04-21-21, 04-28-21    Time 4    Period Weeks    Status On-going    Target Date 05/13/21      SLP SHORT TERM GOAL #5   Title pt will plan a simple day outing or the like, with rare min A    Time 4    Period Weeks    Status On-going    Target Date 05/13/21              SLP Long Term Goals - 04/28/21 1541       SLP LONG TERM GOAL #1   Title pt will demo functional divided attention in conversation in two simple cognitive tasks in 3 sessions    Time 8    Period Weeks    Status On-going    Target Date 06/10/21      SLP LONG TERM GOAL #2   Title pt will demo knowledge of ways her cognitive lingistic deficits may impact her daily life in 3 sessions    Time 8    Period  Weeks    Status On-going    Target Date 06/10/21      SLP LONG TERM GOAL #3   Title pt will plan a 3-day vacation with min extra time    Time 8    Period Weeks    Status On-going    Target Date 06/10/21      SLP LONG TERM GOAL #4   Title pt will utilize some kind of memory enhancement system for to-do lists, MD questions, etc (not med tracking or appointment tracking - pt already performing these WNL with mod I)    Time 8    Period Weeks    Status On-going    Target Date 06/10/21              Plan - 04/28/21 1537     Clinical Impression Statement Ann [name] presents today reporting she feels her cognitive skills are largely back to baseline. SLP targeted attention and attention to detail with problem solving - pt's skills largely WNL today. For the current time, skilled ST is necessary for improving pt's mild cognitive deficits in memory, executve function, and attention. Pt and SLP agreed that decr to once/week was warranted based upon pt performance today and pt report of largely WNL occurrences at home.    Speech Therapy Frequency 1x /week    Duration 8 weeks    Treatment/Interventions Environmental controls;Compensatory techniques;Functional tasks;SLP instruction and feedback;Cueing hierarchy;Cognitive reorganization;Patient/family education;Internal/external aids    Potential to Achieve Goals Good    Consulted and Agree with Plan of Care Patient             Patient will benefit from skilled therapeutic intervention in order to improve the following deficits and impairments:   Cognitive communication deficit    Problem List Patient Active Problem List   Diagnosis Date Noted   TGA (transient global amnesia) 03/25/2021   Cerebral aneurysm 03/25/2021   UTI (urinary tract infection), uncomplicated [id number]   Acute bronchitis 03/21/2021   Abnormal findings on diagnostic imaging of lung 09/22/2019   ILD (interstitial lung disease) ([place]) 09/22/2019   Allergic  rhinitis 07/25/2019  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 [place], [place], [address] Phone: [phone]   Fax:  [phone]   Name: Ann [name] MRN: [id number] Date of Birth: Oct 23, 1945

## 2021-05-02 DIAGNOSIS — L309 Dermatitis, unspecified: Secondary | ICD-10-CM | POA: Diagnosis not present

## 2021-05-02 DIAGNOSIS — L570 Actinic keratosis: Secondary | ICD-10-CM | POA: Diagnosis not present

## 2021-05-02 DIAGNOSIS — D1801 Hemangioma of skin and subcutaneous tissue: Secondary | ICD-10-CM | POA: Diagnosis not present

## 2021-05-02 DIAGNOSIS — L814 Other melanin hyperpigmentation: Secondary | ICD-10-CM | POA: Diagnosis not present

## 2021-05-02 DIAGNOSIS — L821 Other seborrheic keratosis: Secondary | ICD-10-CM | POA: Diagnosis not present

## 2021-05-03 ENCOUNTER — Ambulatory Visit: Payer: Medicare Other

## 2021-05-03 ENCOUNTER — Other Ambulatory Visit: Payer: Self-pay

## 2021-05-03 ENCOUNTER — Encounter: Payer: Self-pay | Admitting: Physical Therapy

## 2021-05-03 ENCOUNTER — Ambulatory Visit: Payer: Medicare Other | Admitting: Physical Therapy

## 2021-05-03 DIAGNOSIS — R2689 Other abnormalities of gait and mobility: Secondary | ICD-10-CM | POA: Diagnosis not present

## 2021-05-03 DIAGNOSIS — R41840 Attention and concentration deficit: Secondary | ICD-10-CM | POA: Diagnosis not present

## 2021-05-03 DIAGNOSIS — M6281 Muscle weakness (generalized): Secondary | ICD-10-CM

## 2021-05-03 DIAGNOSIS — R41841 Cognitive communication deficit: Secondary | ICD-10-CM

## 2021-05-03 DIAGNOSIS — M25551 Pain in right hip: Secondary | ICD-10-CM | POA: Diagnosis not present

## 2021-05-03 NOTE — Patient Instructions (Signed)
WALKING  Walking is a great form of exercise to increase your strength, endurance and overall fitness.  A walking program can help you start slowly and gradually build endurance as you go.  Everyone's ability is different, so each person's starting point will be different.  You do not have to follow them exactly.  The are just samples. You should simply find out what's right for you and stick to that program.   In the beginning, you'll start off walking 2-3 times a day for short distances.  As you get stronger, you'll be walking further at just 1-2 times per day.  A. You Can Walk For A Certain Length Of Time Each Day    Walk 3 minutes 3 times per day.  Increase 1-2 minutes every 3-4 days (3 times per day).  Work up to 10-12 minutes (1-2 times per day).   Example:   Day 7-8 5-6 minutes 2-3 times per day   Day 13-14 8-10 minutes 1-2 times per day  You can work up to walking around the parking lot near your home, with your husband's supervision.

## 2021-05-03 NOTE — Therapy (Signed)
[facility] [address] [place], [place], [address] Phone: [phone]   Fax:  [phone]  Speech Language Pathology Treatment  Patient Details  Name: Ann [name] MRN: [id number] Date of Birth: July 25, 1946 Referring Provider (SLP): Cordelia Poche MD (ref); Marilynne Drivers, PA (documentation)   Encounter Date: 05/03/2021   End of Session - 05/03/21 0847     Visit Number 5    Number of Visits 17    Date for SLP Re-Evaluation 06/17/21    SLP Start Time 0803    SLP Stop Time  0835    SLP Time Calculation (min) 32 min    Activity Tolerance Patient tolerated treatment well             Past Medical History:  Diagnosis Date   Bronchitis, chronic ([place])    Hyperlipidemia    Hypertension    Pneumonia    Pulmonary nodule    Sepsis ([place])     Past Surgical History:  Procedure Laterality Date   CHOLECYSTECTOMY     IR ANGIO INTRA EXTRACRAN SEL INTERNAL CAROTID BILAT MOD SED  04/26/2021   IR ANGIO VERTEBRAL SEL VERTEBRAL BILAT MOD SED  04/26/2021    There were no vitals filed for this visit.          ADULT SLP TREATMENT - 05/03/21 0807       General Information   Behavior/Cognition Alert;Cooperative;Pleasant mood      Treatment Provided   Treatment provided Cognitive-Linquistic      Cognitive-Linquistic Treatment   Treatment focused on Cognition    Skilled Treatment As in previous session pt cont to endorse she is at baseline. SLP provided some linguisitic tasks for her to engage in while conversing with SLP - pt 100% success with both tasks. She is keeping appointments in her calendar on her phone - SLP suggested she write questions for MD in the appointment itselt in "notes" section. her homework is to plan a weekend getaway. Pt provided examples of WNL divided attention at home such as cooking dinner and conversing with her husband about his day. Pt wrote down her homework spontaneously.       Assessment / Recommendations / Plan   Plan --   d/c next session likely     Progression Toward Goals   Progression toward goals Progressing toward goals                SLP Short Term Goals - 05/03/21 [id number]       SLP SHORT TERM GOAL #1   Title pt will complete objective cognitive assessment    Period --   or 2 sessions   Status Achieved      SLP SHORT TERM GOAL #2   Title pt will demo [facility] alternating attention between two mod complex tasks in 2 sessions    Baseline 04-28-21, 05-03-21    Status Achieved    Target Date 05/13/21   pt starts ST in two weeks     SLP SHORT TERM GOAL #3   Title pt will demo organization of household tasks/to-do list/daily tasks with copmensatory strategies in 3 visits    Baseline 04-28-21    Status Achieved   per pt report   Target Date 05/13/21      SLP SHORT TERM GOAL #4   Title pt will double check her work in all therapy tasks in 3 sessions    Baseline 04-21-21, 04-28-21    Status Achieved    Target  Date 05/13/21      SLP SHORT TERM GOAL #5   Title pt will plan a simple day outing or the like, with rare min A    Status Achieved   per pt report   Target Date 05/13/21              SLP Long Term Goals - 05/03/21 0819       SLP LONG TERM GOAL #1   Title pt will demo functional divided attention in conversation in two simple cognitive tasks in 3 sessions    Baseline 04-28-21, 05-03-21    Time 8    Period Weeks    Status On-going      SLP LONG TERM GOAL #2   Title pt will demo knowledge of ways her cognitive lingistic deficits may impact her daily life in 3 sessions    Time 8    Period Weeks    Status Deferred      SLP LONG TERM GOAL #3   Title pt will plan a 3-day vacation with min extra time    Time 8    Period Weeks    Status On-going      SLP LONG TERM GOAL #4   Title pt will utilize some kind of memory enhancement system for to-do lists, MD questions, etc (not med tracking or appointment tracking - pt already performing  these WNL with mod I)    Time --    Period --    Status Achieved              Plan - 05/03/21 0824     Clinical Impression Statement Ann [name] presents today reporting she feels her cognitive skills are largely back to baseline. SLP targeted attention and attention to detail with problem solving - pt's skills largely WNL today. She has cont'd to use memory enhancing techniques which as described, appear functional for pt today. Pt's attention appears to be back to baseline, per pt report and in tasks demonstrated today. Pt and SLP agreed that d/c next session is likely, based upon pt performance today and pt report of largely WNL occurrences at home.    Speech Therapy Frequency 1x /week    Duration 8 weeks    Treatment/Interventions Environmental controls;Compensatory techniques;Functional tasks;SLP instruction and feedback;Cueing hierarchy;Cognitive reorganization;Patient/family education;Internal/external aids    Potential to Achieve Goals Good    Consulted and Agree with Plan of Care Patient             Patient will benefit from skilled therapeutic intervention in order to improve the following deficits and impairments:   Cognitive communication deficit    Problem List Patient Active Problem List   Diagnosis Date Noted   TGA (transient global amnesia) 03/25/2021   Cerebral aneurysm 03/25/2021   UTI (urinary tract infection), uncomplicated [id number]   Acute bronchitis 03/21/2021   Abnormal findings on diagnostic imaging of lung 09/22/2019   ILD (interstitial lung disease) ([place]) 09/22/2019   Allergic rhinitis [id number]   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 [place], [place], [address] Phone: [phone]   Fax:   [phone]   Name: Ann [name] MRN: [id number] Date of Birth: 1946-01-10

## 2021-05-03 NOTE — Therapy (Signed)
[facility] [address] [place], [place], [address] Phone: [phone]   Fax:  [phone]  Physical Therapy Treatment  Patient Details  Name: Ann [name] MRN: [id number] Date of Birth: Aug 29, 1946 Referring Provider (PT): Mariel Aloe, MD   Encounter Date: 05/03/2021   PT End of Session - 05/03/21 0843     Visit Number 4    Number of Visits 8    Date for PT Re-Evaluation [id number]   per cert on [id number]   Authorization Type Medicare    PT Start Time 0845    PT Stop Time 0926    PT Time Calculation (min) 41 min    Activity Tolerance Patient tolerated treatment well    Behavior During Therapy [facility] for tasks assessed/performed             Past Medical History:  Diagnosis Date   Bronchitis, chronic ([place])    Hyperlipidemia    Hypertension    Pneumonia    Pulmonary nodule    Sepsis ([place])     Past Surgical History:  Procedure Laterality Date   CHOLECYSTECTOMY     IR ANGIO INTRA EXTRACRAN SEL INTERNAL CAROTID BILAT MOD SED  04/26/2021   IR ANGIO VERTEBRAL SEL VERTEBRAL BILAT MOD SED  04/26/2021    There were no vitals filed for this visit.   Subjective Assessment - 05/03/21 0843     Subjective Had to cancel last visit because of the angiogram last week.  To see neurosurgeon tomorrow. No falls, feeling like the exercises are helping.  Husband and I did go for a walk the Saturday before last.  Didn't go as far as I used to.    Pertinent History brain aneurysm and benign meningioma with UTI; intersitial lung disease    How long can you sit comfortably? n/a    How long can you stand comfortably? n/a    How long can you walk comfortably? 10-15 min now; was doing 7,000 steps in 2020    Patient Stated Goals Wants to return to wellness and walking    Currently in Pain? No/denies                               [facility] Adult PT Treatment/Exercise - 05/03/21 0001       Ambulation/Gait    Ambulation/Gait Yes    Ambulation/Gait Assistance 6: Modified independent (Device/Increase time)    Ambulation Distance (Feet) 1346 Feet    Assistive device None    Ambulation Surface Level;Indoor    Gait Comments 6 minutes of walking, mild shortness of breath, O2 96%, HR 96      Knee/Hip Exercises: Stretches   ITB Stretch Right;5 reps;10 seconds    ITB Stretch Limitations STanding at counter    Piriformis Stretch Right;3 reps;10 seconds    Piriformis Stretch Limitations sitting position edge of mat    Other Knee/Hip Stretches Standing wide BOS, anterior/posterior rocking stretch for hips, hamstrings, gastrocs, at counter, 5 reps, 10 seconds.    Other Knee/Hip Stretches Wide BOS, lateral weightshifting x 5 reps, as gentle stretch through hips for walking stretch breaks.      Knee/Hip Exercises: Aerobic   Stepper SciFit with BLE/BUE for 5 minutes at gear 1.4 for strengthening/ROM and activity tolerance, reports feeling good with RLE. Pt reporting feeling good after performing.             Access Code: [email or web address] URL: [email or web address]  Date: 04/19/2021 Prepared by: Janann August   Exercises-Reviewed HEP, and pt return demo understanding Supine Pelvic Tilt - 2 x daily - 5 x weekly - 1 sets - 10 reps Supine Lower Trunk Rotation - 2 x daily - 5 x weekly - 1 sets - 10 reps Hooklying Single Knee to Chest Stretch - 2 x daily - 5 x weekly - 2-3 sets - 30 hold Seated Hamstring Stretch - 2 x daily - 5 x weekly - 2-3 sets - 10 reps Hooklying Single Leg Bent Knee Fallouts with Resistance - 1-2 x daily - 5 x weekly - 2 sets - 10 reps   (Pt using yellow theraband at home; tried red theraband today in session x 10 reps with no difficulty).  Advised pt to go up to red theraband at home when 2 sets of 10 reps of yellow band is easy.         PT Education - 05/03/21 [phone]     Education Details Walking program written instructions/follow up    Person(s) Educated Patient     Methods Explanation;Handout    Comprehension Verbalized understanding                 PT Long Term Goals - 04/08/21 1430       PT LONG TERM GOAL #1   Title Pt will be independent with HEP to transition to community wellness programs    Time 4    Period Weeks    Status New    Target Date 05/06/21      PT LONG TERM GOAL #2   Title Pt will improve 6 MWT to at least 500 meters (1640') to be within her age norms    Baseline 457 meters (1500')    Time 4    Period Weeks    Status New    Target Date 05/06/21      PT LONG TERM GOAL #3   Title Pt will be able to walk at least 10 to 15 minutes daily    Baseline Currently not on home walking program    Time 4    Period Weeks    Status New    Target Date 05/06/21                   Plan - 05/03/21 [id number]     Clinical Impression Statement Looked (informally) at 6 minutes walking today, with pt ambulating lower distance (1346 ft) today compared to at eval.  Reviewed and discussed walking program for home, as pt reports she is not consistently walking for exercise.  She was able to progress today to red theraband resistance for bent knee fallouts and increase resistance slightly on SciFit.  She is to see neurosurgeon tomorrow, so she will know more about medical management after tomorrow.  Plan to conitnue towards LTGs.    Personal Factors and Comorbidities Age    Examination-Activity Limitations Locomotion Level;Squat    Examination-Participation Restrictions Community Activity    Stability/Clinical Decision Making Stable/Uncomplicated    Rehab Potential Good    PT Frequency 2x / week    PT Duration 4 weeks    PT Treatment/Interventions Aquatic Therapy;Gait training;Stair training;Functional mobility training;Therapeutic activities;Therapeutic exercise;Balance training;Neuromuscular re-education;Patient/family education;Passive range of motion;Dry needling;Joint Manipulations;Taping;Moist Heat;Cryotherapy;Electrical  Stimulation;Manual techniques    PT Next Visit Plan It appears LTGs are due 9/2, but she has only been seen several visits since eval; date for re-eval is 05/18/21, so I think goals can just be extended.  Check to see how MD visit went with neurosurgeon.  Follow up on walking program.  If pt is trying to increase walking program, may consider adding standing stretches to HEP.    Consulted and Agree with Plan of Care Patient             Patient will benefit from skilled therapeutic intervention in order to improve the following deficits and impairments:  Decreased mobility, Decreased endurance, Pain, Decreased strength, Impaired flexibility, Increased fascial restricitons  Visit Diagnosis: Other abnormalities of gait and mobility  Muscle weakness (generalized)     Problem List Patient Active Problem List   Diagnosis Date Noted   TGA (transient global amnesia) 03/25/2021   Cerebral aneurysm 03/25/2021   UTI (urinary tract infection), uncomplicated [id number]   Acute bronchitis 03/21/2021   Abnormal findings on diagnostic imaging of lung 09/22/2019   ILD (interstitial lung disease) ([place]) 09/22/2019   Allergic rhinitis [id number]   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 05/03/2021, 9:30 AM Frazier Butt., PT   [facility] [address] [place], [place], [address] Phone: [phone]   Fax:  [phone]  Name: Ann [name] MRN: [id number] Date of Birth: [birth year]

## 2021-05-04 DIAGNOSIS — Z6828 Body mass index (BMI) 28.0-28.9, adult: Secondary | ICD-10-CM | POA: Diagnosis not present

## 2021-05-04 DIAGNOSIS — I671 Cerebral aneurysm, nonruptured: Secondary | ICD-10-CM | POA: Diagnosis not present

## 2021-05-06 ENCOUNTER — Encounter: Payer: Self-pay | Admitting: Physical Therapy

## 2021-05-06 ENCOUNTER — Other Ambulatory Visit: Payer: Self-pay

## 2021-05-06 ENCOUNTER — Ambulatory Visit: Payer: Medicare Other | Attending: Family Medicine | Admitting: Physical Therapy

## 2021-05-06 DIAGNOSIS — M25551 Pain in right hip: Secondary | ICD-10-CM | POA: Diagnosis not present

## 2021-05-06 DIAGNOSIS — R2689 Other abnormalities of gait and mobility: Secondary | ICD-10-CM | POA: Diagnosis not present

## 2021-05-06 DIAGNOSIS — R41841 Cognitive communication deficit: Secondary | ICD-10-CM | POA: Insufficient documentation

## 2021-05-06 DIAGNOSIS — M6281 Muscle weakness (generalized): Secondary | ICD-10-CM | POA: Diagnosis not present

## 2021-05-06 NOTE — Therapy (Signed)
[facility] [address] [place], [place], [address] Phone: [phone]   Fax:  [phone]  Physical Therapy Treatment  Patient Details  Name: Ann [name] MRN: [id number] Date of Birth: 1946-01-03 Referring Provider (PT): Mariel Aloe, MD   Encounter Date: 05/06/2021   PT End of Session - 05/06/21 0948     Visit Number 5    Number of Visits 8    Date for PT Re-Evaluation [id number]   per cert on [id number]   Authorization Type Medicare    PT Start Time 0843    PT Stop Time 0928    PT Time Calculation (min) 45 min    Activity Tolerance Patient tolerated treatment well    Behavior During Therapy [facility] for tasks assessed/performed             Past Medical History:  Diagnosis Date   Bronchitis, chronic ([place])    Hyperlipidemia    Hypertension    Pneumonia    Pulmonary nodule    Sepsis ([place])     Past Surgical History:  Procedure Laterality Date   CHOLECYSTECTOMY     IR ANGIO INTRA EXTRACRAN SEL INTERNAL CAROTID BILAT MOD SED  04/26/2021   IR ANGIO VERTEBRAL SEL VERTEBRAL BILAT MOD SED  04/26/2021    There were no vitals filed for this visit.   Subjective Assessment - 05/06/21 0844     Subjective Saw the neurosurgeon on Wednesday - will have to get a craniotomy to clip her aneurysm. Still has not gotten in scheduled. Has been doing her walking program around the house.    Pertinent History brain aneurysm and benign meningioma with UTI; intersitial lung disease    How long can you sit comfortably? n/a    How long can you stand comfortably? n/a    How long can you walk comfortably? 10-15 min now; was doing 7,000 steps in 2020    Patient Stated Goals Wants to return to wellness and walking    Currently in Pain? Yes    Pain Score 3     Pain Location Leg    Pain Orientation Right    Pain Descriptors / Indicators Aching    Pain Type Chronic pain    Aggravating Factors  walking    Pain Relieving  Factors sitting                               [facility] Adult PT Treatment/Exercise - 05/06/21 0849       Knee/Hip Exercises: Stretches   ITB Stretch Right;5 reps;10 seconds    ITB Stretch Limitations standing at countertop, verbal and demo cues for proper technique, pt reporting it felt better the closer she got to the countertop - added to HEP    Other Knee/Hip Stretches Standing wide BOS, anterior/posterior rocking stretch for hips, hamstrings, gastrocs, at counter, 5 reps, 10 seconds.    Other Knee/Hip Stretches Wide BOS, lateral weightshifting x 10 reps, as gentle stretch through hips for walking stretch breaks. - added to HEP      Knee/Hip Exercises: Aerobic   Stepper SciFit with BLE/BUE for 6 minutes at gear 1.4 for strengthening/ROM and activity tolerance, reports feeling good with RLE.      Knee/Hip Exercises: Standing   Hip Abduction Stengthening;Both;2 sets;10 reps    Abduction Limitations cues for technique at countertop with BUE support    Hip Extension Stengthening;Both;2 sets;10 reps  Extension Limitations cues for technique and to perform glute set first to prevent arch in low back    Lateral Step Up Both;10 reps    Lateral Step Up Limitations floating non stance leg, intermittent taps to bars for balance, incr difficulty with RLE    Forward Step Up Both;10 reps    Forward Step Up Limitations floating non stance leg, intermittent taps to bars for balance, incr difficulty with RLE      Knee/Hip Exercises: Supine   Bridges Strengthening;AROM;2 sets;10 reps    Bridges Limitations x10 reps with green tband around thighs for hip ABD activaiton                Access Code: [email or web address] URL: [email or web address] Date: 05/06/2021 Prepared by: Janann August  Updated additions bolded below:  Exercises Supine Pelvic Tilt - 2 x daily - 5 x weekly - 1 sets - 10 reps Supine Lower Trunk Rotation - 2 x daily - 5 x weekly - 1 sets - 10  reps Hooklying Single Knee to Chest Stretch - 2 x daily - 5 x weekly - 2-3 sets - 30 hold Seated Hamstring Stretch - 2 x daily - 5 x weekly - 2-3 sets - 10 reps  ITB Stretch at Wall (Mirrored) - 1 x daily - 5 x weekly - 3 sets - 10-15 hold Lateral Weight Shift - 1 x daily - 5 x weekly - 1-2 sets - 10 reps Standing Hip Abduction with Counter Support - 1 x daily - 5 x weekly - 2 sets - 10 reps Supine Bridge with Resistance Band - 1 x daily - 5 x weekly - 1-2 sets - 10 reps    PT Education - 05/06/21 0947     Education Details standing additions to HEP (will have to email pt as medbridge not working during session)    Person(s) Educated Patient    Methods Explanation;Demonstration;Handout    Comprehension Verbalized understanding;Returned demonstration                 PT Long Term Goals - 05/06/21 0949       PT LONG TERM GOAL #1   Title Pt will be independent with HEP to transition to [employer]. ALL LTGS DUE 05/12/21    Time 4    Period Weeks    Status New    Target Date 05/12/21      PT LONG TERM GOAL #2   Title Pt will improve 6 MWT to at least 500 meters (1640') to be within her age norms    Baseline 457 meters (1500')    Time 4    Period Weeks    Status New      PT LONG TERM GOAL #3   Title Pt will be able to walk at least 10 to 15 minutes daily    Baseline Currently not on home walking program    Time 4    Period Weeks    Status New                   Plan - 05/06/21 [id number]     Clinical Impression Statement Pt has been consistently performing her walking program at home. Added standing stretches to pt's HEP - including R IT band stretch and lateral weight shifting through hips. Also progressed to standing hip ABD strengthening and bridging. Pt tolerated session well, will continue to progress towards LTGs.    Personal Factors and Comorbidities Age    Examination-Activity Limitations  Locomotion Level;Squat    Examination-Participation  Restrictions Community Activity    Stability/Clinical Decision Making Stable/Uncomplicated    Rehab Potential Good    PT Frequency 2x / week    PT Duration 4 weeks    PT Treatment/Interventions Aquatic Therapy;Gait training;Stair training;Functional mobility training;Therapeutic activities;Therapeutic exercise;Balance training;Neuromuscular re-education;Patient/family education;Passive range of motion;Dry needling;Joint Manipulations;Taping;Moist Heat;Cryotherapy;Electrical Stimulation;Manual techniques    PT Next Visit Plan extended goal date - LTGs due at end of next week. has pt been scheduled for craniotomy?  how are new additions to HEP?  gait outdoors if its a nice day. hip ABD/extensory strength. standing balance with SLS.    Consulted and Agree with Plan of Care Patient             Patient will benefit from skilled therapeutic intervention in order to improve the following deficits and impairments:  Decreased mobility, Decreased endurance, Pain, Decreased strength, Impaired flexibility, Increased fascial restricitons  Visit Diagnosis: Other abnormalities of gait and mobility  Muscle weakness (generalized)  Pain in right hip     Problem List Patient Active Problem List   Diagnosis Date Noted   TGA (transient global amnesia) 03/25/2021   Cerebral aneurysm 03/25/2021   UTI (urinary tract infection), uncomplicated [id number]   Acute bronchitis 03/21/2021   Abnormal findings on diagnostic imaging of lung 09/22/2019   ILD (interstitial lung disease) ([place]) 09/22/2019   Allergic rhinitis 123XX123   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

## 2021-05-10 ENCOUNTER — Encounter: Payer: Self-pay | Admitting: Physical Therapy

## 2021-05-10 ENCOUNTER — Ambulatory Visit: Payer: Medicare Other | Admitting: Physical Therapy

## 2021-05-10 ENCOUNTER — Other Ambulatory Visit: Payer: Self-pay

## 2021-05-10 ENCOUNTER — Ambulatory Visit: Payer: Medicare Other

## 2021-05-10 DIAGNOSIS — R2689 Other abnormalities of gait and mobility: Secondary | ICD-10-CM | POA: Diagnosis not present

## 2021-05-10 DIAGNOSIS — M25551 Pain in right hip: Secondary | ICD-10-CM | POA: Diagnosis not present

## 2021-05-10 DIAGNOSIS — M6281 Muscle weakness (generalized): Secondary | ICD-10-CM | POA: Diagnosis not present

## 2021-05-10 DIAGNOSIS — R41841 Cognitive communication deficit: Secondary | ICD-10-CM | POA: Diagnosis not present

## 2021-05-10 NOTE — Therapy (Signed)
[facility] [address] [place], [place], [address] Phone: [phone]   Fax:  [phone]  Physical Therapy Treatment  Patient Details  Name: Ann [name] MRN: [id number] Date of Birth: [birth year] Referring Provider (PT): Mariel Aloe, MD   Encounter Date: 05/10/2021   PT End of Session - 05/10/21 1511     Visit Number 6    Number of Visits 8    Date for PT Re-Evaluation [id number]   per cert on [id number]   Authorization Type Medicare    PT Start Time [id number]    PT Stop Time 1359    PT Time Calculation (min) 42 min    Activity Tolerance Patient tolerated treatment well    Behavior During Therapy [facility] for tasks assessed/performed             Past Medical History:  Diagnosis Date   Bronchitis, chronic ([place])    Hyperlipidemia    Hypertension    Pneumonia    Pulmonary nodule    Sepsis ([place])     Past Surgical History:  Procedure Laterality Date   CHOLECYSTECTOMY     IR ANGIO INTRA EXTRACRAN SEL INTERNAL CAROTID BILAT MOD SED  04/26/2021   IR ANGIO VERTEBRAL SEL VERTEBRAL BILAT MOD SED  04/26/2021    There were no vitals filed for this visit.   Subjective Assessment - 05/10/21 1319     Subjective Have not heard yet from getting the appointment scheduled for the craniotomy yet.  Been walking, up to about 5 minutes 3 times per day.    Pertinent History brain aneurysm and benign meningioma with UTI; intersitial lung disease    How long can you sit comfortably? n/a    How long can you stand comfortably? n/a    How long can you walk comfortably? 10-15 min now; was doing 7,000 steps in 2020    Patient Stated Goals Wants to return to wellness and walking    Currently in Pain? No/denies                               [facility] Adult PT Treatment/Exercise - 05/10/21 0001       Self-Care   Self-Care Other Self-Care Comments    Other Self-Care Comments  Discussed POC and pt's perception of  her progress.  She feels that by end of this POC (appts at end of this week),   she will feel comfortable to keep doing HEP at home.  Discussed that after her surgery, she made need f/u PT and MD would need to re-order that at that time.  Pt requests handouts from previous session, as printer was down, so handouts for HEP printed for pt today.      Knee/Hip Exercises: Standing   Lateral Step Up Both;10 reps    Lateral Step Up Limitations floating non stance leg, intermittent taps to bars for balance, incr difficulty with RLE    Forward Step Up Both;10 reps    Forward Step Up Limitations floating non stance leg, intermittent taps to bars for balance, incr difficulty with RLE    SLS with Vectors Step taps to 6" step, 10 reps, no UE support, then 12" step, 10 reps, no UE support; step taps to 6/12/6" step, x 10 reps each leg.    Other Standing Knee Exercises Forward step up/up, down/down to 6" step, 10 reps each, no UE support; cues for widened BOS upon  return to ground.             Reviewed HEP additions, from last visit, with pt return demo understanding.  ITB Stretch at [employer] (Mirrored) - 1 x daily - 5 x weekly - 3 sets - 10-15 hold Lateral Weight Shift - 1 x daily - 5 x weekly - 1-2 sets - 10 reps Standing Hip Abduction with Counter Support - 1 x daily - 5 x weekly - 2 sets - 10 reps (performed 10 reps) Supine Bridge with Resistance Band - 1 x daily - 5 x weekly - 1-2 sets - 10 reps-cues provided for band resistance in steady position with bridge (cues for technique)         PT Education - 05/10/21 1510     Education Details Reviewed additions to HEP from last visit and added several additional standing exercises (per pt request); discussed likely plans for finishing therapy (d/c vs on hold) for now, as pt is waiting scheduling for craniotomy surgery    Person(s) Educated Patient    Methods Explanation;Demonstration;Handout    Comprehension Verbalized understanding;Returned  demonstration                 PT Long Term Goals - 05/06/21 0949       PT LONG TERM GOAL #1   Title Pt will be independent with HEP to transition to [employer]. ALL LTGS DUE 05/12/21    Time 4    Period Weeks    Status New    Target Date 05/12/21      PT LONG TERM GOAL #2   Title Pt will improve 6 MWT to at least 500 meters (1640') to be within her age norms    Baseline 457 meters (1500')    Time 4    Period Weeks    Status New      PT LONG TERM GOAL #3   Title Pt will be able to walk at least 10 to 15 minutes daily    Baseline Currently not on home walking program    Time 4    Period Weeks    Status New                   Plan - 05/10/21 1512     Clinical Impression Statement Reviewed HEP given last visit, and added several additional standing hip stability exercises to HEP (single limb step taps, then step up/up, down/down step ups).  Pt does not yet feel comfortable with floating single limb step ups, as she needs intermittent UE support and does not have that at her step inside the house.  She will continue to progress towards LTGs for improved gait and mobility.    Personal Factors and Comorbidities Age    Examination-Activity Limitations Locomotion Level;Squat    Examination-Participation Restrictions Community Activity    Stability/Clinical Decision Making Stable/Uncomplicated    Rehab Potential Good    PT Frequency 2x / week    PT Duration 4 weeks    PT Treatment/Interventions Aquatic Therapy;Gait training;Stair training;Functional mobility training;Therapeutic activities;Therapeutic exercise;Balance training;Neuromuscular re-education;Patient/family education;Passive range of motion;Dry needling;Joint Manipulations;Taping;Moist Heat;Cryotherapy;Electrical Stimulation;Manual techniques    PT Next Visit Plan Check LTGs; discuss d/c versus on hold (pt will likely need to be discharged and return with new order, if needed, I think).  Review  additions to HEP given today for hip stability.    Consulted and Agree with Plan of Care Patient  Patient will benefit from skilled therapeutic intervention in order to improve the following deficits and impairments:  Decreased mobility, Decreased endurance, Pain, Decreased strength, Impaired flexibility, Increased fascial restricitons  Visit Diagnosis: Muscle weakness (generalized)  Other abnormalities of gait and mobility     Problem List Patient Active Problem List   Diagnosis Date Noted   TGA (transient global amnesia) 03/25/2021   Cerebral aneurysm 03/25/2021   UTI (urinary tract infection), uncomplicated [id number]   Acute bronchitis 03/21/2021   Abnormal findings on diagnostic imaging of lung 09/22/2019   ILD (interstitial lung disease) ([place]) 09/22/2019   Allergic rhinitis 123XX123   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 05/10/2021, 3:16 PM Frazier Butt., PT   [facility] [address] [place], [place], [address] Phone: [phone]   Fax:  [phone]  Name: Ann [name] MRN: [id number] Date of Birth: August 11, 1946

## 2021-05-10 NOTE — Patient Instructions (Signed)
Access Code: [email or web address] URL: [email or web address] Date: 05/10/2021 Prepared by: Mady Haagensen  Exercises Supine Pelvic Tilt - 2 x daily - 5 x weekly - 1 sets - 10 reps Supine Lower Trunk Rotation - 2 x daily - 5 x weekly - 1 sets - 10 reps Hooklying Single Knee to Chest Stretch - 2 x daily - 5 x weekly - 2-3 sets - 30 hold Seated Hamstring Stretch - 2 x daily - 5 x weekly - 2-3 sets - 10 reps ITB Stretch at Wall (Mirrored) - 1 x daily - 5 x weekly - 3 sets - 10-15 hold Lateral Weight Shift - 1 x daily - 5 x weekly - 1-2 sets - 10 reps Standing Hip Abduction with Counter Support - 1 x daily - 5 x weekly - 2 sets - 10 reps Supine Bridge with Resistance Band - 1 x daily - 5 x weekly - 1-2 sets - 10 reps  Added 05/10/21: Standing Forward Step Taps with Counter Support - 1 x daily - 5 x weekly - 1-2 sets - 10 reps Forward Step Up - 1 x daily - 5 x weekly - 1-2 sets - 10 reps

## 2021-05-11 ENCOUNTER — Other Ambulatory Visit: Payer: Self-pay | Admitting: Neurosurgery

## 2021-05-11 DIAGNOSIS — Z23 Encounter for immunization: Secondary | ICD-10-CM | POA: Diagnosis not present

## 2021-05-12 ENCOUNTER — Ambulatory Visit: Payer: Medicare Other | Admitting: Physical Therapy

## 2021-05-12 ENCOUNTER — Encounter: Payer: Self-pay | Admitting: Physical Therapy

## 2021-05-12 ENCOUNTER — Other Ambulatory Visit: Payer: Self-pay

## 2021-05-12 DIAGNOSIS — M25551 Pain in right hip: Secondary | ICD-10-CM | POA: Diagnosis not present

## 2021-05-12 DIAGNOSIS — R41841 Cognitive communication deficit: Secondary | ICD-10-CM | POA: Diagnosis not present

## 2021-05-12 DIAGNOSIS — M6281 Muscle weakness (generalized): Secondary | ICD-10-CM

## 2021-05-12 DIAGNOSIS — R2689 Other abnormalities of gait and mobility: Secondary | ICD-10-CM

## 2021-05-12 NOTE — Therapy (Signed)
[facility] [address] [place], [place], [address] Phone: [phone]   Fax:  [phone]  Physical Therapy Treatment/Discharge Summary  Patient Details  Name: Ann [name] MRN: [id number] Date of Birth: April 25, 1946 Referring Provider (PT): Mariel Aloe, MD   Encounter Date: 05/12/2021   PT End of Session - 05/12/21 1151     Visit Number 7    Number of Visits 8    Date for PT Re-Evaluation 72/53/66   per cert on 4/40/34   Authorization Type Medicare    PT Start Time 1106   full time not needed due to D/C visit   PT Stop Time 1143    PT Time Calculation (min) 37 min    Activity Tolerance Patient tolerated treatment well    Behavior During Therapy [facility] for tasks assessed/performed             Past Medical History:  Diagnosis Date   Bronchitis, chronic ([place])    Hyperlipidemia    Hypertension    Pneumonia    Pulmonary nodule    Sepsis ([place])     Past Surgical History:  Procedure Laterality Date   CHOLECYSTECTOMY     IR ANGIO INTRA EXTRACRAN SEL INTERNAL CAROTID BILAT MOD SED  04/26/2021   IR ANGIO VERTEBRAL SEL VERTEBRAL BILAT MOD SED  04/26/2021    There were no vitals filed for this visit.   Subjective Assessment - 05/12/21 1108     Subjective Will be getting her surgery on september 27th. Has increased her walking distance to 6 minutes. Overall, R hip pain has gotten a lot better.    Pertinent History brain aneurysm and benign meningioma with UTI; intersitial lung disease    How long can you sit comfortably? n/a    How long can you stand comfortably? n/a    How long can you walk comfortably? 10-15 min now; was doing 7,000 steps in 2020    Patient Stated Goals Wants to return to wellness and walking    Currently in Pain? Yes    Pain Score 2     Pain Location Leg    Pain Orientation Right    Pain Descriptors / Indicators Aching    Pain Type Chronic pain                [facility] PT  Assessment - 05/12/21 1110       Ambulation/Gait   Ambulation/Gait Yes    Ambulation/Gait Assistance 6: Modified independent (Device/Increase time)    Ambulation Distance (Feet) 1331 Feet    Assistive device None    Ambulation Surface Level;Indoor    Gait Comments during 6MWT      6 Minute Walk- Baseline   6 Minute Walk- Baseline yes    HR (bpm) 76    02 Sat (%RA) 97 %      6 Minute walk- Post Test   6 Minute Walk Post Test yes    HR (bpm) 95    02 Sat (%RA) 98 %    Modified Borg Scale for Dyspnea 0.5- Very, very slight shortness of breath      6 minute walk test results    Aerobic Endurance Distance Walked 1330                Access Code: [email or web address] URL: [email or web address] Date: 05/12/2021 Prepared by: Janann August  Reviewed bolded exercises to HEP. See MedBridge for further details.   Exercises Supine Pelvic Tilt - 2 x daily -  5 x weekly - 1 sets - 10 reps Supine Lower Trunk Rotation - 2 x daily - 5 x weekly - 1 sets - 10 reps Hooklying Single Knee to Chest Stretch - 2 x daily - 5 x weekly - 2-3 sets - 30 hold Seated Hamstring Stretch - 2 x daily - 5 x weekly - 2-3 sets - 10 reps ITB Stretch at Wall (Mirrored) - 1 x daily - 5 x weekly - 3 sets - 10-15 hold - performed at wall with pt reporting it felt better vs. At the countertop.  Lateral Weight Shift - 1 x daily - 5 x weekly - 1-2 sets - 10 reps Standing Hip Abduction with Counter Support - 1 x daily - 5 x weekly - 2 sets - 10 reps Supine Bridge with Resistance Band - 1 x daily - 5 x weekly - 1-2 sets - 10 reps Standing Forward Step Taps with Counter Support - 1 x daily - 5 x weekly - 1-2 sets - 10 reps - cues for glute/core activation Forward Step Up - 1 x daily - 5 x weekly - 1-2 sets - 10 reps                Balance Exercises - 05/12/21 1138       Balance Exercises: Standing   Standing Eyes Closed Foam/compliant surface;3 reps;Limitations    Standing Eyes Closed  Limitations on blue air ex with feet hip width distance 3 x 20-30seconds    Tandem Stance Eyes open;Limitations    Tandem Stance Time level ground x30 seconds bilat, x5 reps head turns bilat, incr difficulty with RLE behind    Stepping Strategy Anterior;Posterior;Foam/compliant surface;10 reps;Limitations    Stepping Strategy Limitations alternating each leg x10 reps each direction, cues for wider BOS upon midline, intermittent UE support    Marching Foam/compliant surface;Upper extremity assist 1;Static;10 reps;Limitations    Marching Limitations 10 reps alternating on blue air ex, cues for ROM    Other Standing Exercises on blue air ex: x10 reps sit <> stands with no UE support             PHYSICAL THERAPY DISCHARGE SUMMARY  Visits from Start of Care: 7  Current functional level related to goals / functional outcomes: See LTGs   Remaining deficits: Impaired balance, decr strength, decr endurance.   Education / Equipment: HEP/walking program    Patient agrees to discharge. Patient goals were partially met. Patient is being discharged due to meeting the stated rehab goals. And being pleased with current progress.      PT Education - 05/12/21 1150     Education Details reviewed HEP, progress towards goals, will need referral to return to PT (if needed) after craniotomy at the end of the month    Person(s) Educated Patient    Methods Explanation;Demonstration    Comprehension Verbalized understanding;Returned demonstration                 PT Long Term Goals - 05/12/21 1110       PT LONG TERM GOAL #1   Title Pt will be independent with HEP to transition to [employer]. ALL LTGS DUE 05/12/21    Time 4    Period Weeks    Status Achieved      PT LONG TERM GOAL #2   Title Pt will improve 6 MWT to at least 500 meters (1640') to be within her age norms    Baseline 457 meters (1500'), 1330' on  05/12/21    Time 4    Period Weeks    Status Not Met       PT LONG TERM GOAL #3   Title Pt will be able to walk at least 10 to 15 minutes daily    Baseline pt up to walking 5-6 minutes 2-3x a day.    Time 4    Period Weeks    Status Achieved                   Plan - 05/12/21 1155     Clinical Impression Statement Today's skilled session focused on assessing pt's LTGs. Pt achieved LTG #1 and #3 in regards to being independent with HEP and incr walking at home. Pt did not meet LTG #2 - decr her 6MWT distance to 1330' (at eval was 1500'). However, pt has been consistently performing her walking program at home and is now up to walking 6 minutes 2-3x daily. Due to progress and pt being pleased with current functional level/improvement in R hip pain will D/C from therapy at this time. Pt scheduled for craniotomy surgery at end of september and will need new referral to return.    Personal Factors and Comorbidities Age    Examination-Activity Limitations Locomotion Level;Squat    Examination-Participation Restrictions Community Activity    Stability/Clinical Decision Making Stable/Uncomplicated    Rehab Potential Good    PT Frequency 2x / week    PT Duration 4 weeks    PT Treatment/Interventions Aquatic Therapy;Gait training;Stair training;Functional mobility training;Therapeutic activities;Therapeutic exercise;Balance training;Neuromuscular re-education;Patient/family education;Passive range of motion;Dry needling;Joint Manipulations;Taping;Moist Heat;Cryotherapy;Electrical Stimulation;Manual techniques    PT Next Visit Plan D/C    Consulted and Agree with Plan of Care Patient             Patient will benefit from skilled therapeutic intervention in order to improve the following deficits and impairments:  Decreased mobility, Decreased endurance, Pain, Decreased strength, Impaired flexibility, Increased fascial restricitons  Visit Diagnosis: Muscle weakness (generalized)  Other abnormalities of gait and mobility     Problem  List Patient Active Problem List   Diagnosis Date Noted   TGA (transient global amnesia) 03/25/2021   Cerebral aneurysm 03/25/2021   UTI (urinary tract infection), uncomplicated 24/19/9144   Acute bronchitis 03/21/2021   Abnormal findings on diagnostic imaging of lung 09/22/2019   ILD (interstitial lung disease) ([place]) 09/22/2019   Allergic rhinitis 45/84/8350   Eosinophilic leukocytosis 75/73/2256   Hypertension 04/09/2019   Hyperlipidemia 04/09/2019   Chronic bronchitis ([place]) 04/09/2019    Arliss Journey, PT, DPT  05/12/2021, 11:58 AM  [facility] [address] [place], [place], [address] Phone: [phone]   Fax:  [phone]  Name: Ann [name] MRN: [id number] Date of Birth: Dec 27, 1945

## 2021-05-17 ENCOUNTER — Other Ambulatory Visit: Payer: Self-pay

## 2021-05-17 ENCOUNTER — Ambulatory Visit: Payer: Medicare Other

## 2021-05-17 DIAGNOSIS — M6281 Muscle weakness (generalized): Secondary | ICD-10-CM | POA: Diagnosis not present

## 2021-05-17 DIAGNOSIS — R41841 Cognitive communication deficit: Secondary | ICD-10-CM

## 2021-05-17 DIAGNOSIS — M25551 Pain in right hip: Secondary | ICD-10-CM | POA: Diagnosis not present

## 2021-05-17 DIAGNOSIS — R2689 Other abnormalities of gait and mobility: Secondary | ICD-10-CM | POA: Diagnosis not present

## 2021-05-17 NOTE — Therapy (Signed)
[facility] [address] [place], [place], [address] Phone: [phone]   Fax:  [phone]  Speech Language Pathology Treatment/ discharge summary  Patient Details  Name: Ann [name] MRN: [id number] Date of Birth: [birth year] Referring Provider (SLP): Cordelia Poche MD (ref); Marilynne Drivers, PA (documentation)   Encounter Date: 05/17/2021   End of Session - 05/17/21 1301     Visit Number 6    Number of Visits 17    Date for SLP Re-Evaluation 06/17/21    SLP Start Time 1018    SLP Stop Time  1055    SLP Time Calculation (min) 37 min    Activity Tolerance Patient tolerated treatment well             Past Medical History:  Diagnosis Date   Bronchitis, chronic ([place])    Hyperlipidemia    Hypertension    Pneumonia    Pulmonary nodule    Sepsis ([place])     Past Surgical History:  Procedure Laterality Date   CHOLECYSTECTOMY     IR ANGIO INTRA EXTRACRAN SEL INTERNAL CAROTID BILAT MOD SED  04/26/2021   IR ANGIO VERTEBRAL SEL VERTEBRAL BILAT MOD SED  04/26/2021    There were no vitals filed for this visit.  SPEECH THERAPY DISCHARGE SUMMARY  Visits from Start of Care: 6  Current functional level related to goals / functional outcomes: See below. Pt reports she feels like skills are back at baseline.   Remaining deficits: None noted   Education / Equipment: Memory compensations   Patient agrees to discharge. Patient goals were met. Patient is being discharged due to meeting the stated rehab goals..      Subjective Assessment - 05/17/21 1027     Subjective Pt brought homework to plan a weekend getaway and presented to SLP immediately upon entry to [facility] room.    Currently in Pain? Yes    Pain Score 2     Pain Location Back    Pain Orientation Right    Pain Descriptors / Indicators Tightness;Aching    Pain Type Chronic pain    Pain Radiating Towards rt leg                   ADULT SLP  TREATMENT - 05/17/21 1029       General Information   Behavior/Cognition Alert;Cooperative;Pleasant mood      Treatment Provided   Treatment provided Cognitive-Linquistic      Cognitive-Linquistic Treatment   Treatment focused on Cognition    Skilled Treatment No surprises and no difficulties with cognition since last session. Pt's getaway is well-planned and includes details on sites, lodging, meals, and exercise.divided attention task today completed with 95% success auditory and 100% success written. Pt again feels/thinks that she is at her baseline.      Assessment / Recommendations / Plan   Plan Discharge SLP treatment due to (comment)   pt at baseline     Progression Toward Goals   Progression toward goals --   d/c day- see goals               SLP Short Term Goals - 05/03/21 0156       SLP SHORT TERM GOAL #1   Title pt will complete objective cognitive assessment    Period --   or 2 sessions   Status Achieved      SLP SHORT TERM GOAL #2   Title pt will demo WFL alternating attention between two  mod complex tasks in 2 sessions    Baseline 04-28-21, 05-03-21    Status Achieved    Target Date 05/13/21   pt starts ST in two weeks     [place] #3   Title pt will demo organization of household tasks/to-do list/daily tasks with copmensatory strategies in 3 visits    Baseline 04-28-21    Status Achieved   per pt report   Target Date 05/13/21      SLP SHORT TERM GOAL #4   Title pt will double check her work in all therapy tasks in 3 sessions    Baseline 04-21-21, 04-28-21    Status Achieved    Target Date 05/13/21      SLP SHORT TERM GOAL #5   Title pt will plan a simple day outing or the like, with rare min A    Status Achieved   per pt report   Target Date 05/13/21              SLP Long Term Goals - 05/17/21 1031       SLP LONG TERM GOAL #1   Title pt will demo functional divided attention in conversation in two simple cognitive tasks in 3  sessions    Baseline 04-28-21, 05-03-21, 05-17-21    Time  --   Period  --   Status Achieved   Target Date 06/10/21      SLP LONG TERM GOAL #2   Title pt will demo knowledge of ways her cognitive lingistic deficits may impact her daily life in 3 sessions    Time 8    Period Weeks    Status Deferred    Target Date 06/10/21      SLP LONG TERM GOAL #3   Title pt will plan a 3-day vacation with min extra time    Time --    Period --    Status Achieved    Target Date 06/10/21      SLP LONG TERM GOAL #4   Title pt will utilize some kind of memory enhancement system for to-do lists, MD questions, etc (not med tracking or appointment tracking - pt already performing these WNL with mod I)    Status Deferred    Target Date 06/10/21              Plan - 05/17/21 1303     Clinical Impression Statement Ann [name] presents again today reporting she feels her cognitive skills are largely back to/at baseline. SLP targeted attention and attention to detail with problem solving - pt's skills again largely WNL today. She has cont'd to use memory enhancing techniques which as described, appear functional for pt today. Pt's attention appears to be back to baseline, per pt report and in tasks demonstrated today. Pt and SLP agreed that d/c is appropirate at this time, based upon pt performance today and pt report of largely WNL occurrences at home.    Treatment/Interventions Environmental controls;Compensatory techniques;Functional tasks;SLP instruction and feedback;Cueing hierarchy;Cognitive reorganization;Patient/family education;Internal/external aids    Potential to Achieve Goals Good    Consulted and Agree with Plan of Care Patient             Patient will benefit from skilled therapeutic intervention in order to improve the following deficits and impairments:   Cognitive communication deficit    Problem List Patient Active Problem List   Diagnosis Date Noted   TGA (transient  global amnesia) 03/25/2021   Cerebral aneurysm 03/25/2021  UTI (urinary tract infection), uncomplicated 14/06/3012   Acute bronchitis 03/21/2021   Abnormal findings on diagnostic imaging of lung 09/22/2019   ILD (interstitial lung disease) ([place]) 09/22/2019   Allergic rhinitis 14/38/8875   Eosinophilic leukocytosis 79/72/8206   Hypertension 04/09/2019   Hyperlipidemia 04/09/2019   Chronic bronchitis ([place]) 04/09/2019    Ann [name] ,[place], CCC-SLP  05/17/2021, 1:05 PM  [facility] [address] [place], [place], [address] Phone: [phone]   Fax:  [phone]   Name: Ann [name] MRN: [id number] Date of Birth: 05-Sep-1945

## 2021-05-26 NOTE — Progress Notes (Signed)
Surgical Instructions    Your procedure is scheduled on Friday, September 30th, 2022.   Report to [facility] Main Entrance "A" at 09:40 A.M., then check in with the Admitting office.  Call this number if you have problems the morning of surgery:  [phone]   If you have any questions prior to your surgery date call [phone]: Open Monday-Friday 8am-4pm    Remember:  Do not eat or drink after midnight the night before your surgery    Take these medicines the morning of surgery with A SIP OF WATER:  atorvastatin (LIPITOR)  cetirizine (ZYRTEC)  fluticasone-salmeterol (ADVAIR HFA) - please, bring the inhaler with you the day of surgery  If needed:  acetaminophen (TYLENOL) - please, bring the inhaler with you the day of surgery albuterol (VENTOLIN HFA)  Follow your surgeon's instructions on when to stop Aspirin.  If no instructions were given by your surgeon then you will need to call the office to get those instructions.     As of today, STOP taking any Aspirin (unless otherwise instructed by your surgeon) Aleve, Naproxen, Ibuprofen, Motrin, Advil, Goody's, BC's, all herbal medications, fish oil, and all vitamins.           Do not wear jewelry or makeup Do not wear lotions, powders, perfumes, or deodorant. Do not shave 48 hours prior to surgery.   Do not bring valuables to the hospital. DO Not wear nail polish, gel polish, artificial nails, or any other type of covering on natural nails including finger and toenails. If patients have artificial nails, gel coating, etc. that need to be removed by a nail salon please have this removed prior to surgery or surgery may need to be canceled/delayed if the surgeon/ anesthesia feels like the patient is unable to be adequately monitored.              [facility] is not responsible for any belongings or valuables.  Do NOT Smoke (Tobacco/Vaping)  24 hours prior to your procedure If you use a CPAP at night, you may bring your mask for  your overnight stay.   Contacts, glasses, dentures or bridgework may not be worn into surgery, please bring cases for these belongings   For patients admitted to the hospital, discharge time will be determined by your treatment team.   Patients discharged the day of surgery will not be allowed to drive home, and someone needs to stay with them for 24 hours.  NO VISITORS WILL BE ALLOWED IN PRE-OP WHERE PATIENTS GET READY FOR SURGERY.  ONLY 1 SUPPORT PERSON MAY BE PRESENT WHILE YOU ARE IN SURGERY.  IF YOU ARE TO BE ADMITTED, ONCE YOU ARE IN YOUR ROOM YOU WILL BE ALLOWED TWO (2) VISITORS.  Minor children may have two parents present. Special consideration for safety and communication needs will be reviewed on a case by case basis.  Special instructions:    Oral Hygiene is also important to reduce your risk of infection.  Remember - BRUSH YOUR TEETH THE MORNING OF SURGERY WITH YOUR REGULAR TOOTHPASTE   [facility]- Preparing For Surgery  Before surgery, you can play an important role. Because skin is not sterile, your skin needs to be as free of germs as possible. You can reduce the number of germs on your skin by washing with CHG (chlorahexidine gluconate) Soap before surgery.  CHG is an antiseptic cleaner which kills germs and bonds with the skin to continue killing germs even after washing.     Please  do not use if you have an allergy to CHG or antibacterial soaps. If your skin becomes reddened/irritated stop using the CHG.  Do not shave (including legs and underarms) for at least 48 hours prior to first CHG shower. It is OK to shave your face.  Please follow these instructions carefully.     Shower the NIGHT BEFORE SURGERY and the MORNING OF SURGERY with CHG Soap.   If you chose to wash your hair, wash your hair first as usual with your normal shampoo. After you shampoo, rinse your hair and body thoroughly to remove the shampoo.  Then [employer] and genitals (private parts) with your normal  soap and rinse thoroughly to remove soap.  After that Use CHG Soap as you would any other liquid soap. You can apply CHG directly to the skin and wash gently with a scrungie or a clean washcloth.   Apply the CHG Soap to your body ONLY FROM THE NECK DOWN.  Do not use on open wounds or open sores. Avoid contact with your eyes, ears, mouth and genitals (private parts). Wash Face and genitals (private parts)  with your normal soap.   Wash thoroughly, paying special attention to the area where your surgery will be performed.  Thoroughly rinse your body with warm water from the neck down.  DO NOT shower/wash with your normal soap after using and rinsing off the CHG Soap.  Pat yourself dry with a CLEAN TOWEL.  Wear CLEAN PAJAMAS to bed the night before surgery  Place CLEAN SHEETS on your bed the night before your surgery  DO NOT SLEEP WITH PETS.   Day of Surgery:  Take a shower with CHG soap. Wear Clean/Comfortable clothing the morning of surgery Do not apply any deodorants/lotions.   Remember to brush your teeth WITH YOUR REGULAR TOOTHPASTE.   Please read over the following fact sheets that you were given.

## 2021-05-27 ENCOUNTER — Other Ambulatory Visit: Payer: Self-pay

## 2021-05-27 ENCOUNTER — Encounter (HOSPITAL_COMMUNITY): Payer: Self-pay

## 2021-05-27 ENCOUNTER — Encounter (HOSPITAL_COMMUNITY)
Admission: RE | Admit: 2021-05-27 | Discharge: 2021-05-27 | Disposition: A | Discharge status: Home / Self Care | Payer: Medicare Other | Source: Ambulatory Visit | Attending: Neurosurgery | Admitting: Neurosurgery

## 2021-05-27 DIAGNOSIS — Z01818 Encounter for other preprocedural examination: Secondary | ICD-10-CM | POA: Insufficient documentation

## 2021-05-27 LAB — CBC
HCT: 43.9 % (ref 36.0–46.0)
Hemoglobin: 14.5 g/dL (ref 12.0–15.0)
MCH: 30.2 pg (ref 26.0–34.0)
MCHC: 33 g/dL (ref 30.0–36.0)
MCV: 91.5 fL (ref 80.0–100.0)
Platelets: 270 10*3/uL (ref 150–400)
RBC: 4.8 MIL/uL (ref 3.87–5.11)
RDW: 13 % (ref 11.5–15.5)
WBC: 8.9 10*3/uL (ref 4.0–10.5)
nRBC: 0 % (ref 0.0–0.2)

## 2021-05-27 LAB — BASIC METABOLIC PANEL
Anion gap: 8 (ref 5–15)
BUN: 20 mg/dL (ref 8–23)
CO2: 28 mmol/L (ref 22–32)
Calcium: 9.1 mg/dL (ref 8.9–10.3)
Chloride: 101 mmol/L (ref 98–111)
Creatinine, Ser: 0.69 mg/dL (ref 0.44–1.00)
GFR, Estimated: >60 mL/min (ref >60)
Glucose, Bld: 90 mg/dL (ref 70–99)
Potassium: 3.5 mmol/L (ref 3.5–5.1)
Sodium: 137 mmol/L (ref 135–145)

## 2021-05-27 NOTE — Progress Notes (Signed)
PCP - Mancel Bale. Deneise Lever, [place] Cardiologist - denies  PPM/ICD - denies   Chest x-ray - 03/25/21 EKG - 05/27/21 at PAT appt Stress Test - denies ECHO - denies Cardiac Cath - denies  Sleep Study - denies  DM: denies  Blood Thinner Instructions: n/a Aspirin Instructions: pt instructed to hold 7 days prior to surgery. Pt stopped taking ASA on 05/24/21.  ERAS Protcol - NPO   COVID TEST- pt scheduled for testing on 9/26   Anesthesia review: No  Patient denies shortness of breath, fever, cough and chest pain at PAT appointment   All instructions explained to the patient, with a verbal understanding of the material. Patient agrees to go over the instructions while at home for a better understanding. The opportunity to ask questions was provided.

## 2021-05-30 ENCOUNTER — Other Ambulatory Visit (HOSPITAL_COMMUNITY)
Admission: RE | Admit: 2021-05-30 | Discharge: 2021-05-30 | Disposition: A | Discharge status: Home / Self Care | Payer: Medicare Other | Source: Ambulatory Visit | Attending: Neurosurgery | Admitting: Neurosurgery

## 2021-05-30 ENCOUNTER — Other Ambulatory Visit: Payer: Self-pay

## 2021-05-30 DIAGNOSIS — Z20822 Contact with and (suspected) exposure to covid-19: Secondary | ICD-10-CM | POA: Insufficient documentation

## 2021-05-30 DIAGNOSIS — Z01812 Encounter for preprocedural laboratory examination: Secondary | ICD-10-CM | POA: Insufficient documentation

## 2021-05-30 LAB — SARS CORONAVIRUS 2 (TAT 6-24 HRS): SARS Coronavirus 2: NEGATIVE

## 2021-06-03 ENCOUNTER — Inpatient Hospital Stay (HOSPITAL_COMMUNITY): Payer: Medicare Other | Admitting: Anesthesiology

## 2021-06-03 ENCOUNTER — Inpatient Hospital Stay (HOSPITAL_COMMUNITY)
Admission: RE | Admit: 2021-06-03 | Discharge: 2021-06-06 | DRG: 027 | Disposition: A | Discharge status: Home Health | Payer: Medicare Other | Attending: Neurosurgery | Admitting: Neurosurgery

## 2021-06-03 ENCOUNTER — Inpatient Hospital Stay (HOSPITAL_COMMUNITY)
Admission: RE | Disposition: A | Discharge status: Home Health | Payer: Self-pay | Source: Home / Self Care | Attending: Neurosurgery

## 2021-06-03 ENCOUNTER — Encounter (HOSPITAL_COMMUNITY): Payer: Self-pay | Admitting: Neurosurgery

## 2021-06-03 ENCOUNTER — Other Ambulatory Visit: Payer: Self-pay

## 2021-06-03 DIAGNOSIS — J42 Unspecified chronic bronchitis: Secondary | ICD-10-CM | POA: Diagnosis not present

## 2021-06-03 DIAGNOSIS — Z79899 Other long term (current) drug therapy: Secondary | ICD-10-CM

## 2021-06-03 DIAGNOSIS — Z20822 Contact with and (suspected) exposure to covid-19: Secondary | ICD-10-CM | POA: Diagnosis present

## 2021-06-03 DIAGNOSIS — I671 Cerebral aneurysm, nonruptured: Principal | ICD-10-CM | POA: Diagnosis present

## 2021-06-03 DIAGNOSIS — Z7982 Long term (current) use of aspirin: Secondary | ICD-10-CM

## 2021-06-03 DIAGNOSIS — Z7951 Long term (current) use of inhaled steroids: Secondary | ICD-10-CM

## 2021-06-03 DIAGNOSIS — Z87891 Personal history of nicotine dependence: Secondary | ICD-10-CM | POA: Diagnosis not present

## 2021-06-03 DIAGNOSIS — I10 Essential (primary) hypertension: Secondary | ICD-10-CM | POA: Diagnosis present

## 2021-06-03 DIAGNOSIS — J209 Acute bronchitis, unspecified: Secondary | ICD-10-CM | POA: Diagnosis not present

## 2021-06-03 DIAGNOSIS — G454 Transient global amnesia: Secondary | ICD-10-CM | POA: Diagnosis present

## 2021-06-03 DIAGNOSIS — E785 Hyperlipidemia, unspecified: Secondary | ICD-10-CM | POA: Diagnosis present

## 2021-06-03 HISTORY — PX: CRANIOTOMY: SHX93

## 2021-06-03 LAB — MRSA NEXT GEN BY PCR, NASAL: MRSA by PCR Next Gen: NOT DETECTED

## 2021-06-03 LAB — ABO/RH: ABO/RH(D): A POS

## 2021-06-03 LAB — PREPARE RBC (CROSSMATCH)

## 2021-06-03 SURGERY — CRANIOTOMY INTRACRANIAL ANEURYSM FOR CAROTID
Anesthesia: General | Laterality: Left

## 2021-06-03 MED ORDER — LEVETIRACETAM IN NACL 1000 MG/100ML IV SOLN
1000.0000 mg | INTRAVENOUS | Status: AC
  Administered 2021-06-03: 1000 mg via INTRAVENOUS
  Filled 2021-06-03: qty 100

## 2021-06-03 MED ORDER — CHLORHEXIDINE GLUCONATE CLOTH 2 % EX PADS: 6.0000 | MEDICATED_PAD | Freq: Once | CUTANEOUS | Status: DC

## 2021-06-03 MED ORDER — PROPOFOL 10 MG/ML IV BOLUS
INTRAVENOUS | Status: DC | PRN
  Administered 2021-06-03: 40 mg via INTRAVENOUS
  Administered 2021-06-03: 20 mg via INTRAVENOUS
  Administered 2021-06-03: 30 mg via INTRAVENOUS
  Administered 2021-06-03 (×2): 20 mg via INTRAVENOUS
  Administered 2021-06-03: 130 mg via INTRAVENOUS

## 2021-06-03 MED ORDER — SENNOSIDES-DOCUSATE SODIUM 8.6-50 MG PO TABS: 1.0000 | ORAL_TABLET | Freq: Every evening | ORAL | Status: DC | PRN

## 2021-06-03 MED ORDER — ALBUTEROL SULFATE (2.5 MG/3ML) 0.083% IN NEBU: 2.5000 mg | INHALATION_SOLUTION | Freq: Four times a day (QID) | RESPIRATORY_TRACT | Status: DC | PRN

## 2021-06-03 MED ORDER — LACTATED RINGERS IV SOLN: INTRAVENOUS | Status: DC | PRN

## 2021-06-03 MED ORDER — ACETAMINOPHEN 500 MG PO TABS
1000.0000 mg | ORAL_TABLET | Freq: Once | ORAL | Status: AC
  Administered 2021-06-03: 1000 mg via ORAL
  Filled 2021-06-03: qty 2

## 2021-06-03 MED ORDER — BACITRACIN ZINC 500 UNIT/GM EX OINT
TOPICAL_OINTMENT | CUTANEOUS | Status: AC
  Filled 2021-06-03: qty 28.35

## 2021-06-03 MED ORDER — MORPHINE SULFATE (PF) 2 MG/ML IV SOLN: 1.0000 mg | INTRAVENOUS | Status: DC | PRN

## 2021-06-03 MED ORDER — ONDANSETRON HCL 4 MG/2ML IJ SOLN: 4.0000 mg | INTRAMUSCULAR | Status: DC | PRN

## 2021-06-03 MED ORDER — 0.9 % SODIUM CHLORIDE (POUR BTL) OPTIME
TOPICAL | Status: DC | PRN
  Administered 2021-06-03: 2000 mL

## 2021-06-03 MED ORDER — SODIUM CHLORIDE 0.9 % IV SOLN: INTRAVENOUS | Status: DC

## 2021-06-03 MED ORDER — FENTANYL CITRATE (PF) 100 MCG/2ML IJ SOLN
INTRAMUSCULAR | Status: DC | PRN
  Administered 2021-06-03 (×2): 50 ug via INTRAVENOUS
  Administered 2021-06-03: 100 ug via INTRAVENOUS
  Administered 2021-06-03 (×2): 50 ug via INTRAVENOUS

## 2021-06-03 MED ORDER — THROMBIN 20000 UNITS EX SOLR
CUTANEOUS | Status: AC
  Filled 2021-06-03: qty 20000

## 2021-06-03 MED ORDER — HEPARIN SODIUM (PORCINE) 5000 UNIT/ML IJ SOLN
5000.0000 [IU] | Freq: Three times a day (TID) | INTRAMUSCULAR | Status: DC
  Administered 2021-06-05 – 2021-06-06 (×4): 5000 [IU] via SUBCUTANEOUS
  Filled 2021-06-03 (×4): qty 1

## 2021-06-03 MED ORDER — ESMOLOL HCL 100 MG/10ML IV SOLN
INTRAVENOUS | Status: DC | PRN
  Administered 2021-06-03: 20 mg via INTRAVENOUS

## 2021-06-03 MED ORDER — ACETAMINOPHEN 650 MG RE SUPP: 650.0000 mg | RECTAL | Status: DC | PRN

## 2021-06-03 MED ORDER — BACITRACIN ZINC 500 UNIT/GM EX OINT
TOPICAL_OINTMENT | CUTANEOUS | Status: DC | PRN
  Administered 2021-06-03: 1 via TOPICAL

## 2021-06-03 MED ORDER — HYDROCODONE-ACETAMINOPHEN 5-325 MG PO TABS
1.0000 | ORAL_TABLET | ORAL | Status: DC | PRN
  Administered 2021-06-03 – 2021-06-05 (×5): 1 via ORAL
  Filled 2021-06-03 (×6): qty 1

## 2021-06-03 MED ORDER — PROPOFOL 10 MG/ML IV BOLUS
INTRAVENOUS | Status: AC
  Filled 2021-06-03: qty 20

## 2021-06-03 MED ORDER — CHLORHEXIDINE GLUCONATE 0.12 % MT SOLN: 15.0000 mL | Freq: Once | OROMUCOSAL | Status: AC

## 2021-06-03 MED ORDER — PROMETHAZINE HCL 25 MG PO TABS: 12.5000 mg | ORAL_TABLET | ORAL | Status: DC | PRN

## 2021-06-03 MED ORDER — CEFAZOLIN SODIUM-DEXTROSE 1-4 GM/50ML-% IV SOLN
1.0000 g | Freq: Three times a day (TID) | INTRAVENOUS | Status: AC
  Administered 2021-06-03 – 2021-06-04 (×2): 1 g via INTRAVENOUS
  Filled 2021-06-03 (×2): qty 50

## 2021-06-03 MED ORDER — LABETALOL HCL 5 MG/ML IV SOLN: 10.0000 mg | INTRAVENOUS | Status: DC | PRN

## 2021-06-03 MED ORDER — LIDOCAINE HCL (PF) 1 % IJ SOLN
INTRAMUSCULAR | Status: AC
  Filled 2021-06-03: qty 30

## 2021-06-03 MED ORDER — ACETAMINOPHEN 325 MG PO TABS
650.0000 mg | ORAL_TABLET | ORAL | Status: DC | PRN
  Administered 2021-06-04 – 2021-06-05 (×3): 650 mg via ORAL
  Filled 2021-06-03 (×3): qty 2

## 2021-06-03 MED ORDER — CHLORHEXIDINE GLUCONATE CLOTH 2 % EX PADS
6.0000 | MEDICATED_PAD | Freq: Every day | CUTANEOUS | Status: DC
  Administered 2021-06-05 – 2021-06-06 (×2): 6 via TOPICAL

## 2021-06-03 MED ORDER — CHLORHEXIDINE GLUCONATE 0.12 % MT SOLN
OROMUCOSAL | Status: AC
  Administered 2021-06-03: 15 mL via OROMUCOSAL
  Filled 2021-06-03: qty 15

## 2021-06-03 MED ORDER — PHENYLEPHRINE HCL-NACL 20-0.9 MG/250ML-% IV SOLN
INTRAVENOUS | Status: DC | PRN
  Administered 2021-06-03: 30 ug/min via INTRAVENOUS

## 2021-06-03 MED ORDER — THROMBIN 20000 UNITS EX SOLR
CUTANEOUS | Status: DC | PRN
  Administered 2021-06-03: 20 mL via TOPICAL

## 2021-06-03 MED ORDER — ORAL CARE MOUTH RINSE: 15.0000 mL | Freq: Once | OROMUCOSAL | Status: AC

## 2021-06-03 MED ORDER — LIDOCAINE 2% (20 MG/ML) 5 ML SYRINGE
INTRAMUSCULAR | Status: DC | PRN
  Administered 2021-06-03: 80 mg via INTRAVENOUS

## 2021-06-03 MED ORDER — FENTANYL CITRATE (PF) 250 MCG/5ML IJ SOLN
INTRAMUSCULAR | Status: AC
  Filled 2021-06-03: qty 5

## 2021-06-03 MED ORDER — LEVETIRACETAM IN NACL 500 MG/100ML IV SOLN
500.0000 mg | Freq: Two times a day (BID) | INTRAVENOUS | Status: DC
  Administered 2021-06-03 – 2021-06-05 (×5): 500 mg via INTRAVENOUS
  Filled 2021-06-03 (×5): qty 100

## 2021-06-03 MED ORDER — MOMETASONE FURO-FORMOTEROL FUM 200-5 MCG/ACT IN AERO
2.0000 | INHALATION_SPRAY | Freq: Two times a day (BID) | RESPIRATORY_TRACT | Status: DC
  Administered 2021-06-03 – 2021-06-06 (×6): 2 via RESPIRATORY_TRACT
  Filled 2021-06-03: qty 8.8

## 2021-06-03 MED ORDER — LORATADINE 10 MG PO TABS
10.0000 mg | ORAL_TABLET | Freq: Every day | ORAL | Status: DC
  Administered 2021-06-04 – 2021-06-05 (×2): 10 mg via ORAL
  Filled 2021-06-03 (×2): qty 1

## 2021-06-03 MED ORDER — BUPIVACAINE HCL (PF) 0.5 % IJ SOLN
INTRAMUSCULAR | Status: AC
  Filled 2021-06-03: qty 30

## 2021-06-03 MED ORDER — ONDANSETRON HCL 4 MG/2ML IJ SOLN
INTRAMUSCULAR | Status: DC | PRN
  Administered 2021-06-03 (×2): 4 mg via INTRAVENOUS

## 2021-06-03 MED ORDER — SUGAMMADEX SODIUM 200 MG/2ML IV SOLN
INTRAVENOUS | Status: DC | PRN
  Administered 2021-06-03: 200 mg via INTRAVENOUS
  Administered 2021-06-03: 100 mg via INTRAVENOUS

## 2021-06-03 MED ORDER — THROMBIN (RECOMBINANT) 5000 UNITS EX SOLR
CUTANEOUS | Status: AC
  Filled 2021-06-03: qty 5000

## 2021-06-03 MED ORDER — BUPIVACAINE HCL 0.5 % IJ SOLN
INTRAMUSCULAR | Status: DC | PRN
  Administered 2021-06-03: 7.5 mL

## 2021-06-03 MED ORDER — HYDROCHLOROTHIAZIDE 25 MG PO TABS
25.0000 mg | ORAL_TABLET | Freq: Every day | ORAL | Status: DC
  Administered 2021-06-05: 25 mg via ORAL
  Filled 2021-06-03 (×2): qty 1

## 2021-06-03 MED ORDER — LIDOCAINE HCL (PF) 1 % IJ SOLN
INTRAMUSCULAR | Status: DC | PRN
  Administered 2021-06-03: 7.5 mL

## 2021-06-03 MED ORDER — VITAMIN D 25 MCG (1000 UNIT) PO TABS
2000.0000 [IU] | ORAL_TABLET | Freq: Every day | ORAL | Status: DC
  Administered 2021-06-04 – 2021-06-05 (×2): 2000 [IU] via ORAL
  Filled 2021-06-03 (×2): qty 2

## 2021-06-03 MED ORDER — CEFAZOLIN SODIUM-DEXTROSE 2-4 GM/100ML-% IV SOLN
2.0000 g | INTRAVENOUS | Status: AC
Start: 2021-06-03 — End: 2021-06-03
  Administered 2021-06-03: 2 g via INTRAVENOUS

## 2021-06-03 MED ORDER — INDOCYANINE GREEN 25 MG IV SOLR
25.0000 mg | INTRAVENOUS | Status: DC
  Filled 2021-06-03: qty 10

## 2021-06-03 MED ORDER — BISACODYL 10 MG RE SUPP: 10.0000 mg | Freq: Every day | RECTAL | Status: DC | PRN

## 2021-06-03 MED ORDER — AMISULPRIDE (ANTIEMETIC) 5 MG/2ML IV SOLN: 10.0000 mg | Freq: Once | INTRAVENOUS | Status: DC | PRN

## 2021-06-03 MED ORDER — ONDANSETRON HCL 4 MG/2ML IJ SOLN: 4.0000 mg | Freq: Once | INTRAMUSCULAR | Status: DC | PRN

## 2021-06-03 MED ORDER — CEFAZOLIN SODIUM-DEXTROSE 2-4 GM/100ML-% IV SOLN
INTRAVENOUS | Status: AC
  Filled 2021-06-03: qty 100

## 2021-06-03 MED ORDER — DEXAMETHASONE SODIUM PHOSPHATE 10 MG/ML IJ SOLN
INTRAMUSCULAR | Status: DC | PRN
  Administered 2021-06-03: 10 mg via INTRAVENOUS

## 2021-06-03 MED ORDER — PANTOPRAZOLE SODIUM 40 MG IV SOLR
40.0000 mg | Freq: Every day | INTRAVENOUS | Status: DC
  Administered 2021-06-03 – 2021-06-04 (×2): 40 mg via INTRAVENOUS
  Filled 2021-06-03 (×2): qty 40

## 2021-06-03 MED ORDER — INDOCYANINE GREEN 25 MG IV SOLR
INTRAVENOUS | Status: DC | PRN
  Administered 2021-06-03: 12.5 mg via INTRAVENOUS

## 2021-06-03 MED ORDER — CALCIUM CARBONATE 1250 (500 CA) MG PO TABS
1.0000 | ORAL_TABLET | Freq: Every day | ORAL | Status: DC
  Administered 2021-06-04 – 2021-06-06 (×3): 500 mg via ORAL
  Filled 2021-06-03 (×3): qty 1

## 2021-06-03 MED ORDER — ROCURONIUM BROMIDE 100 MG/10ML IV SOLN
INTRAVENOUS | Status: DC | PRN
  Administered 2021-06-03 (×2): 10 mg via INTRAVENOUS
  Administered 2021-06-03 (×2): 20 mg via INTRAVENOUS
  Administered 2021-06-03: 60 mg via INTRAVENOUS

## 2021-06-03 MED ORDER — DEXAMETHASONE SODIUM PHOSPHATE 10 MG/ML IJ SOLN
INTRAMUSCULAR | Status: AC
  Filled 2021-06-03: qty 1

## 2021-06-03 MED ORDER — LISINOPRIL-HYDROCHLOROTHIAZIDE 20-25 MG PO TABS: 1.0000 | ORAL_TABLET | Freq: Every day | ORAL | Status: DC

## 2021-06-03 MED ORDER — THROMBIN 5000 UNITS EX SOLR
OROMUCOSAL | Status: DC | PRN
  Administered 2021-06-03: 5 mL via TOPICAL

## 2021-06-03 MED ORDER — THROMBIN 20000 UNITS EX KIT
PACK | CUTANEOUS | Status: AC
  Filled 2021-06-03: qty 1

## 2021-06-03 MED ORDER — ATORVASTATIN CALCIUM 10 MG PO TABS
20.0000 mg | ORAL_TABLET | Freq: Every day | ORAL | Status: DC
  Administered 2021-06-04 – 2021-06-05 (×2): 20 mg via ORAL
  Filled 2021-06-03 (×2): qty 2

## 2021-06-03 MED ORDER — ONDANSETRON HCL 4 MG PO TABS: 4.0000 mg | ORAL_TABLET | ORAL | Status: DC | PRN

## 2021-06-03 MED ORDER — THROMBIN (RECOMBINANT) 20000 UNITS EX SOLR
CUTANEOUS | Status: AC
  Filled 2021-06-03: qty 20000

## 2021-06-03 MED ORDER — LISINOPRIL 20 MG PO TABS
20.0000 mg | ORAL_TABLET | Freq: Every day | ORAL | Status: DC
  Administered 2021-06-05: 20 mg via ORAL
  Filled 2021-06-03 (×2): qty 1

## 2021-06-03 MED ORDER — FENTANYL CITRATE (PF) 100 MCG/2ML IJ SOLN: 25.0000 ug | INTRAMUSCULAR | Status: DC | PRN

## 2021-06-03 MED ORDER — LACTATED RINGERS IV SOLN: INTRAVENOUS | Status: DC

## 2021-06-03 MED ORDER — ONDANSETRON HCL 4 MG/2ML IJ SOLN
INTRAMUSCULAR | Status: AC
  Filled 2021-06-03: qty 2

## 2021-06-03 SURGICAL SUPPLY — 88 items
BAG COUNTER SPONGE SURGICOUNT (BAG) ×2 IMPLANT
BAND RUBBER #18 3X1/16 STRL (MISCELLANEOUS) ×4 IMPLANT
BENZOIN TINCTURE PRP APPL 2/3 (GAUZE/BANDAGES/DRESSINGS) IMPLANT
BIT DRILL WIRE PASS 1.3MM (BIT) ×1 IMPLANT
BLADE SAW GIGLI 16 STRL (MISCELLANEOUS) IMPLANT
BLADE SURG 15 STRL LF DISP TIS (BLADE) ×1 IMPLANT
BLADE SURG 15 STRL SS (BLADE) ×1
BNDG GAUZE ELAST 4 BULKY (GAUZE/BANDAGES/DRESSINGS) ×4 IMPLANT
BNDG STRETCH 4X75 NS LF (GAUZE/BANDAGES/DRESSINGS) ×2 IMPLANT
BNDG STRETCH 4X75 STRL LF (GAUZE/BANDAGES/DRESSINGS) ×4 IMPLANT
BUR ACORN 6.0 PRECISION (BURR) IMPLANT
BUR MATCHSTICK NEURO 3.0 LAGG (BURR) ×2 IMPLANT
BUR ROUND FLUTED 4 SOFT TCH (BURR) ×2 IMPLANT
BUR ROUND FLUTED 5 RND (BURR) IMPLANT
BUR SPIRAL ROUTER 2.3 (BUR) ×2 IMPLANT
CANISTER SUCT 3000ML PPV (MISCELLANEOUS) ×2 IMPLANT
CARTRIDGE OIL MAESTRO DRILL (MISCELLANEOUS) ×1 IMPLANT
CLIP ANEURY TI PERM MINI 6.6M (Clip) ×2 IMPLANT
CLIP VESOCCLUDE MED 6/CT (CLIP) IMPLANT
COVER MAYO STAND STRL (DRAPES) IMPLANT
DECANTER SPIKE VIAL GLASS SM (MISCELLANEOUS) ×2 IMPLANT
DIFFUSER DRILL AIR PNEUMATIC (MISCELLANEOUS) ×2 IMPLANT
DRAPE MICROSCOPE LEICA (MISCELLANEOUS) ×2 IMPLANT
DRAPE NEUROLOGICAL W/INCISE (DRAPES) ×2 IMPLANT
DRAPE WARM FLUID 44X44 (DRAPES) ×2 IMPLANT
DRILL WIRE PASS 1.3MM (BIT) ×2
DRSG ADAPTIC 3X8 NADH LF (GAUZE/BANDAGES/DRESSINGS) ×4 IMPLANT
DRSG TELFA 3X8 NADH (GAUZE/BANDAGES/DRESSINGS) ×2 IMPLANT
DURAPREP 6ML APPLICATOR 50/CS (WOUND CARE) ×2 IMPLANT
ELECT REM PT RETURN 9FT ADLT (ELECTROSURGICAL) ×2
ELECTRODE REM PT RTRN 9FT ADLT (ELECTROSURGICAL) ×1 IMPLANT
EVACUATOR SILICONE 100CC (DRAIN) IMPLANT
FORCEPS BIPOLAR SPETZLER 8 1.0 (NEUROSURGERY SUPPLIES) ×2 IMPLANT
GAUZE 4X4 16PLY ~~LOC~~+RFID DBL (SPONGE) IMPLANT
GAUZE SPONGE 4X4 12PLY STRL (GAUZE/BANDAGES/DRESSINGS) IMPLANT
GLOVE SURG ENC MOIS LTX SZ7.5 (GLOVE) IMPLANT
GLOVE SURG LTX SZ7 (GLOVE) ×4 IMPLANT
GLOVE SURG UNDER POLY LF SZ7.5 (GLOVE) ×4 IMPLANT
GOWN STRL REUS W/ TWL LRG LVL3 (GOWN DISPOSABLE) ×2 IMPLANT
GOWN STRL REUS W/ TWL XL LVL3 (GOWN DISPOSABLE) IMPLANT
GOWN STRL REUS W/TWL 2XL LVL3 (GOWN DISPOSABLE) IMPLANT
GOWN STRL REUS W/TWL LRG LVL3 (GOWN DISPOSABLE) ×2
GOWN STRL REUS W/TWL XL LVL3 (GOWN DISPOSABLE)
HEMOSTAT POWDER KIT SURGIFOAM (HEMOSTASIS) ×2 IMPLANT
HEMOSTAT SURGICEL 2X14 (HEMOSTASIS) ×2 IMPLANT
HOOK DURA (MISCELLANEOUS) ×4 IMPLANT
HOOK DURA 1/2IN (MISCELLANEOUS) ×2 IMPLANT
KIT BASIN OR (CUSTOM PROCEDURE TRAY) ×2 IMPLANT
KIT DRAIN CSF ACCUDRAIN (MISCELLANEOUS) IMPLANT
KIT TURNOVER KIT B (KITS) ×2 IMPLANT
KNIFE ARACHNOID DISP AM-24-S (MISCELLANEOUS) ×2 IMPLANT
NEEDLE HYPO 22GX1.5 SAFETY (NEEDLE) IMPLANT
NEEDLE HYPO 25X1 1.5 SAFETY (NEEDLE) ×2 IMPLANT
NEEDLE SPNL 25GX3.5 QUINCKE BL (NEEDLE) IMPLANT
NS IRRIG 1000ML POUR BTL (IV SOLUTION) ×2 IMPLANT
OIL CARTRIDGE MAESTRO DRILL (MISCELLANEOUS) ×2
PACK BATTERY CMF DISP FOR DVR (ORTHOPEDIC DISPOSABLE SUPPLIES) ×2 IMPLANT
PACK CRANIOTOMY CUSTOM (CUSTOM PROCEDURE TRAY) ×2 IMPLANT
PAD ARMBOARD 7.5X6 YLW CONV (MISCELLANEOUS) ×2 IMPLANT
PATTIES SURGICAL .25X.25 (GAUZE/BANDAGES/DRESSINGS) IMPLANT
PATTIES SURGICAL .5 X.5 (GAUZE/BANDAGES/DRESSINGS) IMPLANT
PATTIES SURGICAL .5 X3 (DISPOSABLE) IMPLANT
PATTIES SURGICAL 1/4 X 3 (GAUZE/BANDAGES/DRESSINGS) IMPLANT
PATTIES SURGICAL 1X1 (DISPOSABLE) IMPLANT
PERFORATOR LRG  14-11MM (BIT)
PERFORATOR LRG 14-11MM (BIT) IMPLANT
PIN MAYFIELD SKULL DISP (PIN) IMPLANT
PLATE BONE 12 2H TARGET XL (Plate) ×4 IMPLANT
PLATE CRANIAL 4H UNI NEURO III (Plate) ×2 IMPLANT
SCREW UNIII AXS SD 1.5X4 (Screw) ×8 IMPLANT
SPONGE NEURO XRAY DETECT 1X3 (DISPOSABLE) IMPLANT
SPONGE SURGIFOAM ABS GEL 100 (HEMOSTASIS) ×4 IMPLANT
STAPLER VISISTAT 35W (STAPLE) ×2 IMPLANT
STOCKINETTE 6  STRL (DRAPES) ×1
STOCKINETTE 6 STRL (DRAPES) ×1 IMPLANT
SUT ETHILON 3 0 FSL (SUTURE) IMPLANT
SUT NURALON 4 0 TR CR/8 (SUTURE) ×4 IMPLANT
SUT VIC AB 0 CT1 18XCR BRD8 (SUTURE) ×2 IMPLANT
SUT VIC AB 0 CT1 8-18 (SUTURE) ×2
SUT VIC AB 3-0 SH 8-18 (SUTURE) ×2 IMPLANT
TAPE CLOTH 1X10 TAN NS (GAUZE/BANDAGES/DRESSINGS) ×2 IMPLANT
TIP NONSTICK .5MMX23CM (INSTRUMENTS)
TIP NONSTICK .5X23 (INSTRUMENTS) IMPLANT
TOWEL GREEN STERILE (TOWEL DISPOSABLE) ×2 IMPLANT
TOWEL GREEN STERILE FF (TOWEL DISPOSABLE) ×2 IMPLANT
TRAY FOLEY MTR SLVR 16FR STAT (SET/KITS/TRAYS/PACK) IMPLANT
UNDERPAD 30X36 HEAVY ABSORB (UNDERPADS AND DIAPERS) IMPLANT
WATER STERILE IRR 1000ML POUR (IV SOLUTION) ×2 IMPLANT

## 2021-06-03 NOTE — Transfer of Care (Signed)
Immediate Anesthesia Transfer of Care Note  Patient: Ann [name]  Procedure(s) Performed: Left PTERIONAL CRANIOTOMY FOR CLIPPING OF MCA ANEURYSM (Left)  Patient Location: PACU  Anesthesia Type:General  Level of Consciousness: drowsy and patient cooperative  Airway & Oxygen Therapy: Patient Spontanous Breathing and Patient connected to face mask oxygen  Post-op Assessment: Report given to RN and Post -op Vital signs reviewed and stable  Post vital signs: Reviewed and stable  Last Vitals:  Vitals Value Taken Time  BP 123/89 06/03/21 1712  Temp    Pulse 80 06/03/21 1714  Resp 16 06/03/21 1714  SpO2 100 % 06/03/21 1714  Vitals shown include unvalidated device data.  Last Pain:  Vitals:   06/03/21 0938  TempSrc:   PainSc: 0-No pain         Complications: No notable events documented.

## 2021-06-03 NOTE — Anesthesia Preprocedure Evaluation (Addendum)
Anesthesia Evaluation  Patient identified by MRN, date of birth, ID band Patient awake    Reviewed: Allergy & Precautions, NPO status , Patient's Chart, lab work & pertinent test results  Airway Mallampati: II  TM Distance: >3 FB Neck ROM: Full    Dental no notable dental hx.    Pulmonary COPD (chronic bronchitis), former smoker,  Interstitial lung disease   Pulmonary exam normal  + decreased breath sounds      Cardiovascular Exercise Tolerance: Good hypertension, Normal cardiovascular exam Rhythm:Regular Rate:Normal     Neuro/Psych negative neurological ROS  negative psych ROS   GI/Hepatic negative GI ROS, Neg liver ROS,   Endo/Other  negative endocrine ROS  Renal/GU negative Renal ROS  negative genitourinary   Musculoskeletal negative musculoskeletal ROS (+)   Abdominal   Peds negative pediatric ROS (+)  Hematology negative hematology ROS (+)   Anesthesia Other Findings   Reproductive/Obstetrics negative OB ROS                            Anesthesia Physical Anesthesia Plan  ASA: 3  Anesthesia Plan: General   Post-op Pain Management:    Induction: Intravenous  PONV Risk Score and Plan: 3 and Treatment may vary due to age or medical condition, Ondansetron and Dexamethasone  Airway Management Planned: Oral ETT  Additional Equipment: Arterial line  Intra-op Plan:   Post-operative Plan: Extubation in OR  Informed Consent: I have reviewed the patients History and Physical, chart, labs and discussed the procedure including the risks, benefits and alternatives for the proposed anesthesia with the patient or authorized representative who has indicated his/her understanding and acceptance.     Dental advisory given  Plan Discussed with: Anesthesiologist, CRNA and Surgeon  Anesthesia Plan Comments:        Anesthesia Quick Evaluation

## 2021-06-03 NOTE — Anesthesia Procedure Notes (Signed)
Procedure Name: Intubation Date/Time: 06/03/2021 12:55 PM Performed by: Gwyndolyn Saxon, CRNA Pre-anesthesia Checklist: Patient identified, Emergency Drugs available, Suction available and Patient being monitored Patient Re-evaluated:Patient Re-evaluated prior to induction Oxygen Delivery Method: Circle system utilized Preoxygenation: Pre-oxygenation with 100% oxygen Induction Type: IV induction Ventilation: Mask ventilation without difficulty Laryngoscope Size: Miller and 2 Grade View: Grade I Tube type: Oral Tube size: 7.0 mm Number of attempts: 1 Airway Equipment and Method: Stylet Placement Confirmation: ETT inserted through vocal cords under direct vision, positive ETCO2, CO2 detector and breath sounds checked- equal and bilateral Secured at: 21 cm Tube secured with: Tape Dental Injury: Teeth and Oropharynx as per pre-operative assessment

## 2021-06-03 NOTE — Anesthesia Procedure Notes (Signed)
Arterial Line Insertion Start/End9/30/2022 10:29 AM, 06/03/2021 10:34 AM Performed by: Dorthea Cove, CRNA, CRNA  Patient location: Pre-op. Preanesthetic checklist: patient identified, IV checked, site marked, risks and benefits discussed, surgical consent, monitors and equipment checked, pre-op evaluation, timeout performed and anesthesia consent Lidocaine 1% used for infiltration Right, radial was placed Catheter size: 20 G Hand hygiene performed  and maximum sterile barriers used   Attempts: 1 Procedure performed without using ultrasound guided technique. Following insertion, dressing applied and Biopatch. Post procedure assessment: normal and unchanged  Patient tolerated the procedure well with no immediate complications.

## 2021-06-03 NOTE — H&P (Signed)
Chief Complaint  Aneurysm  History of Present Illness  Ann [name] is a 75 y.o. female with incidentally discovered left middle cerebral artery aneurysm.  She initially presented to the emergency department with confusion and was diagnosed with transient global amnesia.  Her work-up included CT and CTA which demonstrated the aneurysm.  She subsequently underwent further work-up with diagnostic cerebral angiogram revealing a left MCA bifurcation aneurysm incorporating the origin of both M2 vessels.  After lengthy discussion she elected to proceed with surgical clip ligation.  Past Medical History   Past Medical History:  Diagnosis Date   Bronchitis, chronic ([place])    Hyperlipidemia    Hypertension    Interstitial lung disease ([place]) 2020   pt sees a pulmonologist   Pneumonia    Pulmonary nodule    Sepsis ([place])     Past Surgical History   Past Surgical History:  Procedure Laterality Date   CHOLECYSTECTOMY     EYE SURGERY Bilateral 2021   cataracts   IR ANGIO INTRA EXTRACRAN SEL INTERNAL CAROTID BILAT MOD SED  04/26/2021   IR ANGIO VERTEBRAL SEL VERTEBRAL BILAT MOD SED  04/26/2021    Social History   Social History   Tobacco Use   Smoking status: Former    Packs/day: 0.10    Years: 12.00    Pack years: 1.20    Types: Cigarettes    Start date: 1964    Quit date: 1976    Years since quitting: 46.7   Smokeless tobacco: Never   Tobacco comments:    during smoking years only smoked ocassionally  Vaping Use   Vaping Use: Never used  Substance Use Topics   Alcohol use: Yes    Comment: pt states 1 glass of wine once or twice a month   Drug use: Never    Medications   Prior to Admission medications   Medication Sig Start Date End Date Taking? Authorizing Provider  acetaminophen (TYLENOL) 500 MG tablet Take 1,000 mg by mouth every 6 (six) hours as needed for mild pain.   Yes [provider]  albuterol (VENTOLIN HFA) 108 (90 Base) MCG/ACT inhaler  Inhale 1-2 puffs into the lungs every 6 (six) hours as needed for wheezing or shortness of breath. 11/10/19  Yes Margaretha Seeds, MD  aspirin EC 81 MG tablet Take 81 mg by mouth daily. Swallow whole.   Yes [provider]  atorvastatin (LIPITOR) 20 MG tablet Take 20 mg by mouth daily. 12/26/18  Yes [provider]  calcium carbonate (OS-CAL - DOSED IN MG OF ELEMENTAL CALCIUM) 1250 (500 Ca) MG tablet Take 1 tablet by mouth daily with breakfast.   Yes [provider]  cetirizine (ZYRTEC) 10 MG tablet Take 10 mg by mouth daily.   Yes [provider]  Cholecalciferol (VITAMIN D3) 50 MCG (2000 UT) TABS Take 2,000 Units by mouth daily.   Yes [provider]  clobetasol ointment (TEMOVATE) 2.40 % Apply 1 application topically daily. Apply daily to skin to affected area every day for 30 days   Yes [provider]  fluticasone-salmeterol (ADVAIR HFA) 230-21 MCG/ACT inhaler Inhale 2 puffs into the lungs 2 (two) times daily. 10/18/20  Yes Margaretha Seeds, MD  lisinopril-hydrochlorothiazide (ZESTORETIC) 20-25 MG tablet Take 1 tablet by mouth daily. 12/26/18  Yes [provider]    Allergies  No Known Allergies  Review of Systems  ROS  Neurologic Exam  Awake, alert, oriented Memory and concentration grossly intact Speech fluent, appropriate  CN grossly intact Motor exam: Upper Extremities Deltoid Bicep Tricep Grip  Right 5/5 5/5 5/5 5/5  Left 5/5 5/5 5/5 5/5   Lower Extremities IP Quad PF DF EHL  Right 5/5 5/5 5/5 5/5 5/5  Left 5/5 5/5 5/5 5/5 5/5   Sensation grossly intact to LT  Imaging  Diagnostic cerebral angiogram was again reviewed and demonstrates approximately 5 to 6 mm left MCA bifurcation aneurysm incorporating the origin of both M2 segments.  Impression  - 75 y.o. female with incidentally discovered left MCA aneurysm measuring 5 to 6 mm.  Plan  -We will plan on proceeding with left pterional craniotomy for clipping of  middle cerebral artery aneurysm  I have reviewed the details of the surgery as well as the expected postoperative course and recovery with the patient and her husband.  We have discussed at length the associated risks, benefits, and alternatives to surgery.  All her questions today were answered and she provided informed consent to proceed.  Consuella Lose, MD [facility]

## 2021-06-03 NOTE — Op Note (Signed)
NEUROSURGERY OPERATIVE NOTE   PREOP DIAGNOSIS:  Left MCA aneurysm   POSTOP DIAGNOSIS: Same  PROCEDURE: Left pterional craniotomy for clipping of middle cerebral artery aneurysm, simple Intraoperative ICG video angiography Use of intraoperative microscope for microdissection  SURGEON: Dr. Consuella Lose, MD  ASSISTANT: Dr. Earnie Larsson, MD  ANESTHESIA: General Endotracheal  EBL: 300cc  SPECIMENS: None  DRAINS: None  COMPLICATIONS: None immediate  CONDITION: Hemodynamically stable to PACU  HISTORY: Ann [name] is a 75 y.o. female with incidental discovery of a left middle cerebral artery aneurysm during work-up of confusion.  She underwent diagnostic cerebral angiogram demonstrating a relatively wide neck left middle cerebral artery bifurcation aneurysm not easily amenable to endovascular treatment.  Patient therefore elected to proceed with surgical clipping.  The risks, benefits, and alternatives to surgery were all reviewed in detail with the patient and her family.  After all her questions were answered informed consent was obtained and witnessed.  PROCEDURE IN DETAIL: The patient was brought to the operating room. After induction of general anesthesia, the patient was positioned on the operative table in the [place] head holder in the supine position. All pressure points were meticulously padded.  Left-sided curvilinear frontotemporal skin incision was then marked out and prepped and draped in the usual sterile fashion.  After timeout was conducted, the incision was infiltrated with local anesthetic.  Incision was then made sharply and carried down through the galea.  Raney clips were applied.  Bovie electrocautery was used to dissect through the temporalis fascia and muscle.  Single piece myocutaneous flap was then elevated and reflected anteriorly.  High-speed drill was used to create bur holes in the pterion, superior temporal line, and temporal bone above the  zygoma.  These were connected with the craniotome.  Single piece frontotemporal flap was then elevated.  Hemostasis was secured on the epidural plane with bipolar electrocautery and morselized Gelfoam with thrombin.  High-speed drill was then used to drill down the lesser wing of the sphenoid to provide unobstructed access to the carotid artery and optical carotid cistern.  At this point the dura was opened in curvilinear fashion and tacked up with 4 Nurolon stitches.  Microscope was then draped sterilely and brought into the field and the remainder of the case was done under the microscope using microdissection technique.  Initially a subfrontal approach was employed to identify the optic nerve.  Arachnoid overlying the optic nerve was then cut sharply both medially and laterally to open up the optical carotid cistern.  The supraclinoid internal carotid artery was then identified.  This was followed more distally until the ICA bifurcation was identified.  The more distal sylvian fissure was then dissected and noted to be relatively adherent between the frontal and temporal lobes.  Dissection of the sylvian fissure allowed identification of the MCA until the MCA bifurcation was identified.  The dome of the aneurysm was identified and noted to be significantly adherent to the temporal lobe.  I dissected the sylvian fissure a little bit more distally in order to identify the more distal M2 segments.  I then carefully dissected the dome of the aneurysm away from the temporal lobe sharply.  This allowed gentle mobilization of the aneurysm identification of the more distal middle cerebral artery and the proximal aneurysm neck.  The MCA branch incorporated by the aneurysm neck was also identified and the distal aneurysm neck was also identified.  Initially, a curved standard titanium clip was placed across the neck of the aneurysm perpendicular  to the direction of the M2 branch.  This appeared to significantly kink the  M2 vessel.  I therefore remove this clip.  A curved mini titanium clip was then selected and placed across the neck of the aneurysm, parallel to the superiorly directed M2 branch.  This appeared to provide patency of the M2 segments as well as the more proximal M1.  The distal clip blades were then visualized covering the aneurysm neck.  At this point the anesthesia service administered a 12.5 mg dose of indocyanine green.  Intraoperative video angiography confirmed patency of the M1 and M2 segments, with no filling of the aneurysm dome.  The wound was then irrigated with normal saline.  No active bleeding was identified.  The dura was reapproximated with interrupted 4 Nurolon stitches.  Hemostasis on the epidural plane was secured with bipolar electrocautery.  A piece of Gelfoam onlay graft was placed.  The bone was replaced and plated with standard titanium plates and screws.  Wound was again irrigated with normal saline.  The temporalis muscle and fascia was approximated with with interrupted 0 Vicryl stitches.  B[place] was reapproximated with interrupted 3-0 Vicryl stitches.  Skin staples were applied.  Bacitracin ointment and Telfa dressing was placed.  Patient was then removed from the [place] head holder.  Head wrap was placed.  The patient was then extubated and taken to the postanesthesia care unit in stable hemodynamic condition.  At the end of the case all sponge, needle, instrument, and cottonoid counts were correct.  Consuella Lose, MD [facility]

## 2021-06-04 NOTE — Anesthesia Postprocedure Evaluation (Signed)
Anesthesia Post Note  Patient: Anvita Hirata  Procedure(s) Performed: Left PTERIONAL CRANIOTOMY FOR CLIPPING OF MCA ANEURYSM (Left)     Patient location during evaluation: PACU Anesthesia Type: General Level of consciousness: sedated Pain management: pain level controlled Vital Signs Assessment: post-procedure vital signs reviewed and stable Respiratory status: spontaneous breathing, respiratory function stable, nonlabored ventilation and patient connected to nasal cannula oxygen Cardiovascular status: stable Postop Assessment: no apparent nausea or vomiting Anesthetic complications: no   No notable events documented.  Last Vitals:  Vitals:   06/04/21 1500 06/04/21 1600  BP: 109/90 (!) 100/49  Pulse: 73 64  Resp: (!) 24 13  Temp:  36.7 C  SpO2: (!) 85% 97%    Last Pain:  Vitals:   06/04/21 1623  TempSrc:   PainSc: 5                  Candra R Ketan Renz

## 2021-06-04 NOTE — Progress Notes (Signed)
Postop day 1.  Patient doing well postoperatively.  Minimal headache.  No complaints otherwise.  No speech or language issues.  No seizures.  Rested well overnight.  She is afebrile.  Her vital signs are stable.  She is awake and alert.  She is oriented and appropriate.  Motor and sensory function intact.  Wound clean and dry.  Chest and abdomen benign.  Progressing well following craniotomy and clipping of aneurysm.  Continue efforts at mobilization.  Possible discharge Monday.

## 2021-06-05 MED ORDER — PANTOPRAZOLE SODIUM 40 MG PO TBEC
40.0000 mg | DELAYED_RELEASE_TABLET | Freq: Every day | ORAL | Status: DC
  Administered 2021-06-05: 40 mg via ORAL
  Filled 2021-06-05: qty 1

## 2021-06-05 NOTE — Evaluation (Signed)
Occupational Therapy Evaluation Patient Details Name: Ann [name] MRN: [id number] DOB: [birth year] Today's Date: 06/05/2021   History of Present Illness 75 y/o female s/p L craniotomy with clipping of MCA aneurysm on 9/30. PMH: HLD, HTN, insterstitial lung disease, pulmonary nodule, chronic bronchitis   Clinical Impression   Pt admitted with above. She demonstrates the below listed deficits and will benefit from continued OT to maximize safety and independence with BADLs.  Pt presents to OT with mild balance deficits, increased pain, decreased activity tolerance.  She currently requires supervision - min guard assist for ADLs and functional mobility.  She will benefit from further cognitive and visual assessment.  PTA, she was fully independent and lives with spouse.  Will follow acutely.        Recommendations for follow up therapy are one component of a multi-disciplinary discharge planning process, led by the attending physician.  Recommendations may be updated based on patient status, additional functional criteria and insurance authorization.   Follow Up Recommendations  No OT follow up;Supervision - Intermittent    Equipment Recommendations  None recommended by OT    Recommendations for Other Services       Precautions / Restrictions Precautions Precautions: Fall Restrictions Weight Bearing Restrictions: No      Mobility Bed Mobility Overal bed mobility: Needs Assistance Bed Mobility: Supine to Sit     Supine to sit: Supervision;HOB elevated     General bed mobility comments: supervision for safety    Transfers Overall transfer level: Needs assistance Equipment used: Rolling walker (2 wheeled) Transfers: Sit to/from [employer] Sit to Stand: Supervision Stand pivot transfers: Supervision       General transfer comment: supervision for safety.  Pt somewhat impulsive    Balance Overall balance assessment: Mild deficits observed, not  formally tested                                         ADL either performed or assessed with clinical judgement   ADL Overall ADL's : Needs assistance/impaired Eating/Feeding: Set up;Bed level;Sitting   Grooming: Wash/dry hands;Wash/dry face;Supervision/safety;Standing   Upper Body Bathing: Set up;Supervision/ safety;Sitting   Lower Body Bathing: Supervison/ safety;Sit to/from stand   Upper Body Dressing : Set up;Supervision/safety;Sitting   Lower Body Dressing: Supervision/safety;Sit to/from stand   Toilet Transfer: Supervision/safety;Ambulation;Comfort height toilet   Toileting- Clothing Manipulation and Hygiene: Supervision/safety;Sit to/from stand       Functional mobility during ADLs: Supervision/safety;Rolling walker       Vision Baseline Vision/History: 0 No visual deficits Ability to See in Adequate Light: 0 Adequate Vision Assessment?: Yes Eye Alignment: Within Functional Limits Ocular Range of Motion: Within Functional Limits Additional Comments: Pt unable to participate in field testing due to edema Lt eyelid and increased pain with attempts at vision testing     Perception Perception Perception Tested?: Yes   Praxis Praxis Praxis tested?: Within functional limits    Pertinent Vitals/Pain Pain Assessment: 0-10 Pain Score: 6  Pain Location: head Pain Descriptors / Indicators: Headache;Operative site guarding Pain Intervention(s): Monitored during session;Premedicated before session;Limited activity within patient's tolerance     Hand Dominance Right   Extremity/Trunk Assessment Upper Extremity Assessment Upper Extremity Assessment: Overall WFL for tasks assessed   Lower Extremity Assessment Lower Extremity Assessment: Defer to PT evaluation   Cervical / Trunk Assessment Cervical / Trunk Assessment: Normal   Communication Communication Communication: No difficulties  Cognition Arousal/Alertness: Awake/alert Behavior During  Therapy: WFL for tasks assessed/performed;Impulsive Overall Cognitive Status: Within Functional Limits for tasks assessed                                 General Comments: Pt mildly impulsive. Cognition WFL for basic tasks, but will benefit from further assessment   General Comments  VSS    Exercises     Shoulder Instructions      Home Living Family/patient expects to be discharged to:: Private residence Living Arrangements: Spouse/significant other Available Help at Discharge: Family;Available PRN/intermittently Type of Home: House Home Access: Stairs to enter [employer] of Steps: 3 Entrance Stairs-Rails: Left Home Layout: One level     Bathroom Shower/Tub: Tub/shower unit;Door   [employer] Toilet: Standard Bathroom Accessibility: Yes   Home Equipment: None          Prior Functioning/Environment Level of Independence: Independent        Comments: drives; manages own medications, helps with managing bills/fincnaces        OT Problem List: Decreased activity tolerance;Impaired balance (sitting and/or standing);Decreased cognition;Decreased safety awareness;Pain      OT Treatment/Interventions: Self-care/ADL training;DME and/or AE instruction;Therapeutic activities;Cognitive remediation/compensation;Visual/perceptual remediation/compensation;Patient/family education;Balance training    OT Goals(Current goals can be found in the care plan section) Acute Rehab OT Goals Patient Stated Goal: to reduce pain OT Goal Formulation: With patient Time For Goal Achievement: 06/19/21 Potential to Achieve Goals: Good ADL Goals Pt Will Perform Grooming: with modified independence;standing Pt Will Perform Upper Body Bathing: with modified independence;sitting;standing Pt Will Perform Lower Body Bathing: with modified independence;sit to/from stand Pt Will Perform Upper Body Dressing: with modified independence;sitting;standing Pt Will Perform Lower  Body Dressing: with modified independence;sit to/from stand Pt Will Transfer to Toilet: with modified independence;ambulating;regular height toilet;grab bars Pt Will Perform Toileting - Clothing Manipulation and hygiene: with modified independence;sit to/from stand Pt Will Perform Tub/Shower Transfer: Shower transfer;ambulating;rolling walker Additional ADL Goal #1: Pt will complete moderately complex path finding task mod I  OT Frequency: Min 2X/week   Barriers to D/C:            Co-evaluation              AM-PAC OT "6 Clicks" Daily Activity     Outcome Measure Help from another person eating meals?: None Help from another person taking care of personal grooming?: A Little Help from another person toileting, which includes using toliet, bedpan, or urinal?: A Little Help from another person bathing (including washing, rinsing, drying)?: A Little Help from another person to put on and taking off regular upper body clothing?: A Little Help from another person to put on and taking off regular lower body clothing?: A Little 6 Click Score: 19   End of Session Equipment Utilized During Treatment: Rolling walker Nurse Communication: Mobility status  Activity Tolerance: Patient tolerated treatment well Patient left: in bed;with call bell/phone within reach;with family/visitor present  OT Visit Diagnosis: Unsteadiness on feet (R26.81);Cognitive communication deficit (R41.841)                Time: 1740-8144 OT Time Calculation (min): 17 min Charges:  OT General Charges $OT Visit: 1 Visit OT Evaluation $OT Eval Moderate Complexity: 1 Mod  Nilsa Nutting., OTR/L Acute Rehabilitation Services Pager [phone] Office [phone]   Lucille Passy M 06/05/2021, 4:41 PM

## 2021-06-05 NOTE — Progress Notes (Signed)
Postop day 2.  Overall doing well.  Moderate headache this morning.  Denies any numbness paresthesias or weakness.  She is not having any speech or language problems.  She mobilized yesterday reasonably well.  She is afebrile.  Her vital signs are stable.  She is awake and alert.  She is oriented and appropriate.  Her cranial nerve function is intact bilaterally.  Motor examination 5/5.  No drift.  Chest and abdomen benign.  Progressing well following craniotomy and clipping of MCA aneurysm.  Continue mobilization.  Likely discharge home tomorrow.

## 2021-06-05 NOTE — Evaluation (Signed)
Physical Therapy Evaluation Patient Details Name: Ann [name] MRN: [id number] DOB: 11-30-1945 Today's Date: 06/05/2021  History of Present Illness  75 y/o female s/p L craniotomy with clipping of MCA aneurysm on 9/30. PMH: HLD, HTN, insterstitial lung disease, pulmonary nodule, chronic bronchitis  Clinical Impression  PTA, patient lives with husband and reports independence. Patient presents with generalized weakness, impaired balance, decreased activity tolerance, and pain. Patient required minA for sit to stand transfer and supervision for ambulation with RW. Patient with mild balance deficits. Patient with increased pain resulting in closing eyes during session, cues for maintaining eyes open. Patient will benefit from skilled PT services during acute stay to address listed deficits. Recommend HHPT following discharge to maximize functional independence and safety.        Recommendations for follow up therapy are one component of a multi-disciplinary discharge planning process, led by the attending physician.  Recommendations may be updated based on patient status, additional functional criteria and insurance authorization.  Follow Up Recommendations Home health PT;Supervision for mobility/OOB    Equipment Recommendations  Rolling Orchid Glassberg with 5" wheels;3in1 (PT)    Recommendations for Other Services       Precautions / Restrictions Precautions Precautions: Fall Restrictions Weight Bearing Restrictions: No      Mobility  Bed Mobility Overal bed mobility: Needs Assistance Bed Mobility: Supine to Sit     Supine to sit: Supervision;HOB elevated     General bed mobility comments: supervision for safety    Transfers Overall transfer level: Needs assistance Equipment used: Rolling Kari Montero (2 wheeled) Transfers: Sit to/from Stand Sit to Stand: Min assist         General transfer comment: minA to power up into standing. Cues for hand placement with poor follow  through  Ambulation/Gait Ambulation/Gait assistance: Supervision Gait Distance (Feet): 120 Feet Assistive device: Rolling Abdelaziz Westenberger (2 wheeled) Gait Pattern/deviations: Step-through pattern;Decreased stride length;Trunk flexed Gait velocity: decreased   General Gait Details: supervision for safety  Stairs            Wheelchair Mobility    Modified Rankin (Stroke Patients Only)       Balance Overall balance assessment: Mild deficits observed, not formally tested                                           Pertinent Vitals/Pain Pain Assessment: 0-10 Pain Score: 6  Pain Location: head Pain Descriptors / Indicators: Headache;Operative site guarding Pain Intervention(s): Monitored during session;Repositioned    Home Living Family/patient expects to be discharged to:: Private residence Living Arrangements: Spouse/significant other Available Help at Discharge: Family;Available PRN/intermittently Type of Home: House Home Access: Stairs to enter Entrance Stairs-Rails: Left Entrance Stairs-Number of Steps: 3 Home Layout: One level Home Equipment: None      Prior Function Level of Independence: Independent         Comments: drives; manages own medications, helps with managing bills/fincnaces     Hand Dominance        Extremity/Trunk Assessment   Upper Extremity Assessment Upper Extremity Assessment: Defer to OT evaluation    Lower Extremity Assessment Lower Extremity Assessment: Generalized weakness    Cervical / Trunk Assessment Cervical / Trunk Assessment: Normal  Communication   Communication: No difficulties  Cognition Arousal/Alertness: Awake/alert Behavior During Therapy: WFL for tasks assessed/performed Overall Cognitive Status: Within Functional Limits for tasks assessed  General Comments      Exercises     Assessment/Plan    PT Assessment Patient needs continued  PT services  PT Problem List Decreased strength;Decreased activity tolerance;Decreased balance;Decreased mobility;Decreased knowledge of use of DME       PT Treatment Interventions Gait training;DME instruction;Stair training;Functional mobility training;Therapeutic activities;Therapeutic exercise;Balance training;Patient/family education    PT Goals (Current goals can be found in the Care Plan section)  Acute Rehab PT Goals Patient Stated Goal: to reduce pain PT Goal Formulation: With patient Time For Goal Achievement: 06/19/21 Potential to Achieve Goals: Good    Frequency Min 4X/week   Barriers to discharge        Co-evaluation               AM-PAC PT "6 Clicks" Mobility  Outcome Measure Help needed turning from your back to your side while in a flat bed without using bedrails?: A Little Help needed moving from lying on your back to sitting on the side of a flat bed without using bedrails?: A Little Help needed moving to and from a bed to a chair (including a wheelchair)?: A Little Help needed standing up from a chair using your arms (e.g., wheelchair or bedside chair)?: A Little Help needed to walk in hospital room?: A Little Help needed climbing 3-5 steps with a railing? : A Little 6 Click Score: 18    End of Session Equipment Utilized During Treatment: Gait belt Activity Tolerance: Patient tolerated treatment well Patient left: in chair;with call bell/phone within reach;with family/visitor present Nurse Communication: Mobility status PT Visit Diagnosis: Unsteadiness on feet (R26.81);Muscle weakness (generalized) (M62.81)    Time: 4163-8453 PT Time Calculation (min) (ACUTE ONLY): 22 min   Charges:   PT Evaluation $PT Eval Moderate Complexity: 1 Mod          Pablo Stauffer A. Gilford Rile PT, DPT Acute Rehabilitation Services Pager [phone] Office [phone]   Linna Hoff 06/05/2021, 1:03 PM

## 2021-06-06 ENCOUNTER — Encounter (HOSPITAL_COMMUNITY): Payer: Self-pay | Admitting: Neurosurgery

## 2021-06-06 LAB — TYPE AND SCREEN
ABO/RH(D): A POS
Antibody Screen: NEGATIVE
Unit division: 0
Unit division: 0
Unit division: 0
Unit division: 0

## 2021-06-06 LAB — BPAM RBC
Blood Product Expiration Date: 202210302359
Blood Product Expiration Date: 202210302359
Blood Product Expiration Date: 202210302359
Blood Product Expiration Date: 202210302359
ISSUE DATE / TIME: 202209301332
ISSUE DATE / TIME: 202209301332
Unit Type and Rh: 6200
Unit Type and Rh: 6200
Unit Type and Rh: 6200
Unit Type and Rh: 6200

## 2021-06-06 MED ORDER — HYDROCODONE-ACETAMINOPHEN 5-325 MG PO TABS: 1.0000 | ORAL_TABLET | Freq: Four times a day (QID) | ORAL | 0 refills | Status: DC | PRN

## 2021-06-06 MED ORDER — LEVETIRACETAM 500 MG PO TABS: 500.0000 mg | ORAL_TABLET | Freq: Two times a day (BID) | ORAL | 0 refills | Status: DC

## 2021-06-06 MED FILL — Thrombin (Recombinant) For Soln 5000 Unit: CUTANEOUS | Qty: 5000 | Status: AC

## 2021-06-06 NOTE — Discharge Summary (Signed)
Physician Discharge Summary  Patient ID: Ann [name] MRN: [id number] DOB/AGE: Aug 02, 1946 75 y.o.  Admit date: 06/03/2021 Discharge date: 06/06/2021  Admission Diagnoses:  Cerebral aneurysm, non-ruptured  Discharge Diagnoses:  Same Active Problems:   Cerebral aneurysm   Aneurysm, cerebral, nonruptured   Discharged Condition: Stable  Hospital Course:  Ann [name] is a 75 y.o. female  with incidental discovery of left middle cerebral artery aneurysm after previous visit to the emergency department for confusion.  She was electively admitted after craniotomy for clipping of aneurysm.  She was at neurologic baseline postoperatively.  She was monitored  in the intensive care unit where she remained in good neurologic condition, was ambulating with minimal assistance, voiding normally, and tolerating diet.  She had minimal headache controlled with oral medication.  She therefore requested discharge.  Treatments: Surgery -   Left frontotemporal craniotomy for clipping of middle cerebral artery aneurysm  Discharge Exam: Blood pressure (!) 129/59, pulse 62, temperature 99 F (37.2 C), temperature source Oral, resp. rate 20, height 5\' 5"  (1.651 m), weight 74.8 kg, SpO2 95 %. Awake, alert, oriented Speech fluent, appropriate CN grossly intact 5/5 BUE/BLE Wound c/d/i  Disposition: Discharge disposition: 01-Home or Self Care       Discharge Instructions     Call MD for:  redness, tenderness, or signs of infection (pain, swelling, redness, odor or green/yellow discharge around incision site)   Complete by: As directed    Call MD for:  temperature >100.4   Complete by: As directed    Diet - low sodium heart healthy   Complete by: As directed    Discharge instructions   Complete by: As directed    Walk at home as much as possible, at least 4 times / day   Face-to-face encounter (required for Medicare/Medicaid patients)   Complete by: As directed    I Jairo Ben certify that this patient is under my care and that I, or a nurse practitioner or physician's assistant working with me, had a face-to-face encounter that meets the physician face-to-face encounter requirements with this patient on 06/06/2021. The encounter with the patient was in whole, or in part for the following medical condition(s) which is the primary reason for home health care (List medical condition): Brain aneurysm s/p craniotomy   The encounter with the patient was in whole, or in part, for the following medical condition, which is the primary reason for home health care: cerebral aneurysm   I certify that, based on my findings, the following services are medically necessary home health services: Physical therapy   Reason for Medically Necessary Home Health Services: Therapy- Instruction on Safe use of Assistive Devices for ADLs   My clinical findings support the need for the above services:  Pain interferes with ambulation/mobility Unsafe ambulation due to balance issues     Further, I certify that my clinical findings support that this patient is homebound due to: Pain interferes with ambulation/mobility   Home Health   Complete by: As directed    To provide the following care/treatments:  OT PT     Increase activity slowly   Complete by: As directed    Lifting restrictions   Complete by: As directed    No lifting > 10 lbs   May shower / Bathe   Complete by: As directed    48 hours after surgery   May walk up steps   Complete by: As directed    No dressing needed  Complete by: As directed    Other Restrictions   Complete by: As directed    No bending/twisting at waist      Allergies as of 06/06/2021   No Known Allergies      Medication List     TAKE these medications    acetaminophen 500 MG tablet Commonly known as: TYLENOL Take 1,000 mg by mouth every 6 (six) hours as needed for mild pain.   Advair HFA 230-21 MCG/ACT inhaler Generic drug:  fluticasone-salmeterol Inhale 2 puffs into the lungs 2 (two) times daily.   albuterol 108 (90 Base) MCG/ACT inhaler Commonly known as: VENTOLIN HFA Inhale 1-2 puffs into the lungs every 6 (six) hours as needed for wheezing or shortness of breath.   aspirin EC 81 MG tablet Take 81 mg by mouth daily. Swallow whole.   atorvastatin 20 MG tablet Commonly known as: LIPITOR Take 20 mg by mouth daily.   calcium carbonate 1250 (500 Ca) MG tablet Commonly known as: OS-CAL - dosed in mg of elemental calcium Take 1 tablet by mouth daily with breakfast.   cetirizine 10 MG tablet Commonly known as: ZYRTEC Take 10 mg by mouth daily.   clobetasol ointment 0.05 % Commonly known as: TEMOVATE Apply 1 application topically daily. Apply daily to skin to affected area every day for 30 days   HYDROcodone-acetaminophen 5-325 MG tablet Commonly known as: NORCO/VICODIN Take 1 tablet by mouth every 6 (six) hours as needed for moderate pain.   levETIRAcetam 500 MG tablet Commonly known as: Keppra Take 1 tablet (500 mg total) by mouth 2 (two) times daily.   lisinopril-hydrochlorothiazide 20-25 MG tablet Commonly known as: ZESTORETIC Take 1 tablet by mouth daily.   Vitamin D3 50 MCG (2000 UT) Tabs Take 2,000 Units by mouth daily.               Discharge Care Instructions  (From admission, onward)           Start     Ordered   06/06/21 0000  No dressing needed        06/06/21 0815            Follow-up Information     Lois Huxley, PA Follow up.   Specialty: Family Medicine Contact information: [place] [address] [phone]                 Signed: Jairo Ben 06/06/2021, 10:31 AM

## 2021-06-08 DIAGNOSIS — Z48811 Encounter for surgical aftercare following surgery on the nervous system: Secondary | ICD-10-CM | POA: Diagnosis not present

## 2021-06-08 DIAGNOSIS — I671 Cerebral aneurysm, nonruptured: Secondary | ICD-10-CM | POA: Diagnosis not present

## 2021-06-08 DIAGNOSIS — Z87891 Personal history of nicotine dependence: Secondary | ICD-10-CM | POA: Diagnosis not present

## 2021-06-08 DIAGNOSIS — I10 Essential (primary) hypertension: Secondary | ICD-10-CM | POA: Diagnosis not present

## 2021-06-08 DIAGNOSIS — Z7982 Long term (current) use of aspirin: Secondary | ICD-10-CM | POA: Diagnosis not present

## 2021-06-08 DIAGNOSIS — E785 Hyperlipidemia, unspecified: Secondary | ICD-10-CM | POA: Diagnosis not present

## 2021-06-08 DIAGNOSIS — R569 Unspecified convulsions: Secondary | ICD-10-CM | POA: Diagnosis not present

## 2021-06-09 DIAGNOSIS — R569 Unspecified convulsions: Secondary | ICD-10-CM | POA: Diagnosis not present

## 2021-06-09 DIAGNOSIS — Z7982 Long term (current) use of aspirin: Secondary | ICD-10-CM | POA: Diagnosis not present

## 2021-06-09 DIAGNOSIS — Z48811 Encounter for surgical aftercare following surgery on the nervous system: Secondary | ICD-10-CM | POA: Diagnosis not present

## 2021-06-09 DIAGNOSIS — E785 Hyperlipidemia, unspecified: Secondary | ICD-10-CM | POA: Diagnosis not present

## 2021-06-09 DIAGNOSIS — I671 Cerebral aneurysm, nonruptured: Secondary | ICD-10-CM | POA: Diagnosis not present

## 2021-06-09 DIAGNOSIS — I10 Essential (primary) hypertension: Secondary | ICD-10-CM | POA: Diagnosis not present

## 2021-06-10 DIAGNOSIS — E785 Hyperlipidemia, unspecified: Secondary | ICD-10-CM | POA: Diagnosis not present

## 2021-06-10 DIAGNOSIS — Z48811 Encounter for surgical aftercare following surgery on the nervous system: Secondary | ICD-10-CM | POA: Diagnosis not present

## 2021-06-10 DIAGNOSIS — I10 Essential (primary) hypertension: Secondary | ICD-10-CM | POA: Diagnosis not present

## 2021-06-10 DIAGNOSIS — R569 Unspecified convulsions: Secondary | ICD-10-CM | POA: Diagnosis not present

## 2021-06-10 DIAGNOSIS — I671 Cerebral aneurysm, nonruptured: Secondary | ICD-10-CM | POA: Diagnosis not present

## 2021-06-10 DIAGNOSIS — Z7982 Long term (current) use of aspirin: Secondary | ICD-10-CM | POA: Diagnosis not present

## 2021-06-13 DIAGNOSIS — Z48811 Encounter for surgical aftercare following surgery on the nervous system: Secondary | ICD-10-CM | POA: Diagnosis not present

## 2021-06-13 DIAGNOSIS — I671 Cerebral aneurysm, nonruptured: Secondary | ICD-10-CM | POA: Diagnosis not present

## 2021-06-13 DIAGNOSIS — E785 Hyperlipidemia, unspecified: Secondary | ICD-10-CM | POA: Diagnosis not present

## 2021-06-13 DIAGNOSIS — Z7982 Long term (current) use of aspirin: Secondary | ICD-10-CM | POA: Diagnosis not present

## 2021-06-13 DIAGNOSIS — I10 Essential (primary) hypertension: Secondary | ICD-10-CM | POA: Diagnosis not present

## 2021-06-13 DIAGNOSIS — R569 Unspecified convulsions: Secondary | ICD-10-CM | POA: Diagnosis not present

## 2021-06-14 DIAGNOSIS — R7303 Prediabetes: Secondary | ICD-10-CM | POA: Diagnosis not present

## 2021-06-14 DIAGNOSIS — E7800 Pure hypercholesterolemia, unspecified: Secondary | ICD-10-CM | POA: Diagnosis not present

## 2021-06-14 DIAGNOSIS — I10 Essential (primary) hypertension: Secondary | ICD-10-CM | POA: Diagnosis not present

## 2021-06-14 DIAGNOSIS — I700 Atherosclerosis of aorta: Secondary | ICD-10-CM | POA: Diagnosis not present

## 2021-06-14 DIAGNOSIS — I671 Cerebral aneurysm, nonruptured: Secondary | ICD-10-CM | POA: Diagnosis not present

## 2021-06-15 DIAGNOSIS — I10 Essential (primary) hypertension: Secondary | ICD-10-CM | POA: Diagnosis not present

## 2021-06-15 DIAGNOSIS — R569 Unspecified convulsions: Secondary | ICD-10-CM | POA: Diagnosis not present

## 2021-06-15 DIAGNOSIS — I671 Cerebral aneurysm, nonruptured: Secondary | ICD-10-CM | POA: Diagnosis not present

## 2021-06-15 DIAGNOSIS — Z7982 Long term (current) use of aspirin: Secondary | ICD-10-CM | POA: Diagnosis not present

## 2021-06-15 DIAGNOSIS — Z48811 Encounter for surgical aftercare following surgery on the nervous system: Secondary | ICD-10-CM | POA: Diagnosis not present

## 2021-06-15 DIAGNOSIS — E785 Hyperlipidemia, unspecified: Secondary | ICD-10-CM | POA: Diagnosis not present

## 2021-06-16 DIAGNOSIS — R569 Unspecified convulsions: Secondary | ICD-10-CM | POA: Diagnosis not present

## 2021-06-16 DIAGNOSIS — Z7982 Long term (current) use of aspirin: Secondary | ICD-10-CM | POA: Diagnosis not present

## 2021-06-16 DIAGNOSIS — I671 Cerebral aneurysm, nonruptured: Secondary | ICD-10-CM | POA: Diagnosis not present

## 2021-06-16 DIAGNOSIS — Z48811 Encounter for surgical aftercare following surgery on the nervous system: Secondary | ICD-10-CM | POA: Diagnosis not present

## 2021-06-16 DIAGNOSIS — E785 Hyperlipidemia, unspecified: Secondary | ICD-10-CM | POA: Diagnosis not present

## 2021-06-16 DIAGNOSIS — I10 Essential (primary) hypertension: Secondary | ICD-10-CM | POA: Diagnosis not present

## 2021-06-17 DIAGNOSIS — I671 Cerebral aneurysm, nonruptured: Secondary | ICD-10-CM | POA: Diagnosis not present

## 2021-06-17 DIAGNOSIS — E785 Hyperlipidemia, unspecified: Secondary | ICD-10-CM | POA: Diagnosis not present

## 2021-06-17 DIAGNOSIS — R569 Unspecified convulsions: Secondary | ICD-10-CM | POA: Diagnosis not present

## 2021-06-17 DIAGNOSIS — Z48811 Encounter for surgical aftercare following surgery on the nervous system: Secondary | ICD-10-CM | POA: Diagnosis not present

## 2021-06-17 DIAGNOSIS — I10 Essential (primary) hypertension: Secondary | ICD-10-CM | POA: Diagnosis not present

## 2021-06-17 DIAGNOSIS — Z7982 Long term (current) use of aspirin: Secondary | ICD-10-CM | POA: Diagnosis not present

## 2021-06-21 ENCOUNTER — Encounter: Payer: Self-pay | Admitting: Neurology

## 2021-06-21 ENCOUNTER — Ambulatory Visit (INDEPENDENT_AMBULATORY_CARE_PROVIDER_SITE_OTHER): Payer: Medicare Other | Admitting: Neurology

## 2021-06-21 VITALS — BP 110/69 | HR 75 | Ht 64.0 in | Wt 166.6 lb

## 2021-06-21 DIAGNOSIS — G3184 Mild cognitive impairment, so stated: Secondary | ICD-10-CM

## 2021-06-21 DIAGNOSIS — R413 Other amnesia: Secondary | ICD-10-CM

## 2021-06-21 DIAGNOSIS — G454 Transient global amnesia: Secondary | ICD-10-CM | POA: Diagnosis not present

## 2021-06-21 DIAGNOSIS — I671 Cerebral aneurysm, nonruptured: Secondary | ICD-10-CM

## 2021-06-21 NOTE — Progress Notes (Signed)
[facility] [address] [place]. [place] [address] [phone]       OFFICE FOLLOW-UP NOTE  [name] Date of Birth:  [birth year] Medical Record Number:  [id number]   HPI: [name] is a 75 year old Caucasian lady seen today for initial office visit following hospital consultation for memory loss episode in July 2022.  She is accompanied by her husband.  History is obtained from them and review of electronic medical records and I personally reviewed pertinent available imaging films in PACS.  She has past medical history of hypertension, hyperlipidemia, chronic bronchitis who presented to the emergency room on 03/25/2021 with episode of sudden onset of memory loss.  Patient apparently and often was taking care of her son's dogs and she laid them out into the yard and the little dog's head got caught in the fence and she called her husband from inside the house who had to take the fence apart to free the dog.  Dog was bleeding so the husband took the dog to the urgent care and when he called the patient from the patient had no memory of the event and did not remember that the dog got into trouble even though she was the one who found the dog trapped and called the husband.  She seemed surprised and kept repeating herself and asking the same questions over and over again.  She does not remember events from a several hours and afternoon and evening and started remembering events from the waiting room at [facility]  Patient denied any accompanying headache, slurred speech, gait or balance problems.  There is no prior history of similar memory loss episodes of seizures or closed head injury with loss of consciousness.  MRI scan of the brain showed a punctate focus of restricted diffusion in the lateral hippocampal body with corresponding ADC dark signal.  There was moderate patchy white matter hyperintensities compatible with changes of small vessel disease.  MR  angiogram of the brain showed a 5- 6 mm saccular aneurysm from the left MCA bifurcation.  MRA of the neck was unremarkable.  LDL cholesterol was 84 mg percent and hemoglobin A1c was 6.1.  On exam patient had only mild short-term memory difficulties for the episode.  On the slums scale she scored 26/30 consistent with mild cognitive impairment.  She was seen by Dr. Kathyrn Sheriff neurosurgeon and subsequently underwent elective craniotomy for surgical clipping of asymptomatic left MCA aneurysm on 06/03/2021 the procedure went well.  She is just complaining of mild tiredness.  She still has surgical clipping from the left forehead and has an appointment coming Thursday to see the surgeon to take the clippings out.  She has no new complaints.  There is no family history of brain aneurysms.  She does not smoke.  Blood pressure is well controlled.  ROS:   14 system review of systems is positive for memory difficulties, tiredness all other systems negative  PMH:  Past Medical History:  Diagnosis Date   Bronchitis, chronic ([place])    Hyperlipidemia    Hypertension    Interstitial lung disease ([place]) 2020   pt sees a pulmonologist   Pneumonia    Pulmonary nodule    Sepsis ([place])     Social History:  Social History   Socioeconomic History   Marital status: Married    Spouse name: Not on file   Number of children: Not on file   Years of education: Not on file   Highest education level:  Not on file  Occupational History   Not on file  Tobacco Use   Smoking status: Former    Packs/day: 0.10    Years: 12.00    Pack years: 1.20    Types: Cigarettes    Start date: 1964    Quit date: 1976    Years since quitting: 46.8   Smokeless tobacco: Never   Tobacco comments:    during smoking years only smoked ocassionally  Vaping Use   Vaping Use: Never used  Substance and Sexual Activity   Alcohol use: Yes    Comment: pt states 1 glass of wine once or twice a month   Drug use: Never   Sexual activity:  Not Currently  Other Topics Concern   Not on file  Social History Narrative   Not on file   Social Determinants of Health   Financial Resource Strain: Not on file  Food Insecurity: Not on file  Transportation Needs: Not on file  Physical Activity: Not on file  Stress: Not on file  Social Connections: Not on file  Intimate Partner Violence: Not on file    Medications:   Current Outpatient Medications on File Prior to Visit  Medication Sig Dispense Refill   acetaminophen (TYLENOL) 500 MG tablet Take 1,000 mg by mouth every 6 (six) hours as needed for mild pain.     albuterol (VENTOLIN HFA) 108 (90 Base) MCG/ACT inhaler Inhale 1-2 puffs into the lungs every 6 (six) hours as needed for wheezing or shortness of breath. 8 g 3   aspirin EC 81 MG tablet Take 81 mg by mouth daily. Swallow whole.     atorvastatin (LIPITOR) 20 MG tablet Take 20 mg by mouth daily.     calcium carbonate (OS-CAL - DOSED IN MG OF ELEMENTAL CALCIUM) 1250 (500 Ca) MG tablet Take 1 tablet by mouth daily with breakfast.     cetirizine (ZYRTEC) 10 MG tablet Take 10 mg by mouth daily.     Cholecalciferol (VITAMIN D3) 50 MCG (2000 UT) TABS Take 2,000 Units by mouth daily.     clobetasol ointment (TEMOVATE) 5.46 % Apply 1 application topically daily. Apply daily to skin to affected area every day for 30 days     fluticasone-salmeterol (ADVAIR HFA) 230-21 MCG/ACT inhaler Inhale 2 puffs into the lungs 2 (two) times daily. 1 each 12   HYDROcodone-acetaminophen (NORCO/VICODIN) 5-325 MG tablet Take 1 tablet by mouth every 6 (six) hours as needed for moderate pain. 20 tablet 0   levETIRAcetam (KEPPRA) 500 MG tablet Take 1 tablet (500 mg total) by mouth 2 (two) times daily. 50 tablet 0   lisinopril-hydrochlorothiazide (ZESTORETIC) 20-25 MG tablet Take 1 tablet by mouth daily.     No current facility-administered medications on file prior to visit.    Allergies:  No Known Allergies  Physical Exam General: well developed,  well nourished pleasant elderly Caucasian lady, seated, in no evident distress Head: head normocephalic and atraumatic.  Neck: supple with no carotid or supraclavicular bruits Cardiovascular: regular rate and rhythm, no murmurs Musculoskeletal: no deformity Skin:  no rash/petichiae Vascular:  Normal pulses all extremities Vitals:   06/21/21 0805  BP: 110/69  Pulse: 75   Neurologic Exam Mental Status: Awake and fully alert. Oriented to place and time. Recent and remote memory intact. Attention span, concentration and fund of knowledge appropriate. Mood and affect appropriate.  Diminished recall 2/3.  Able to name 13 animals which can walk on 4 legs.  Clock drawing 4/4.  Able  to copy intersecting pentagons well. Cranial Nerves: Fundoscopic exam reveals sharp disc margins. Pupils equal, briskly reactive to light. Extraocular movements full without nystagmus. Visual fields full to confrontation. Hearing intact. Facial sensation intact. Face, tongue, palate moves normally and symmetrically.  Motor: Normal bulk and tone. Normal strength in all tested extremity muscles. Sensory.: intact to touch ,pinprick .position and vibratory sensation.  Coordination: Rapid alternating movements normal in all extremities. Finger-to-nose and heel-to-shin performed accurately bilaterally. Gait and Station: Arises from chair without difficulty. Stance is normal. Gait demonstrates normal stride length and balance .  Not able to heel, toe and tandem walk without difficulty.  Reflexes: 1+ and symmetric. Toes downgoing.   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  She also has mild underlying cognitive impairment as well as was found to have asymptomatic 5 x 6 mm left MCA bifurcation and resume for for which she underwent elective craniotomy with clipping on 06/03/2021 and has done well    PLAN: I  had a long discussion with the patient and her husband regarding her episode of memory loss likely presenting with transient global amnesia as well as underlying mild cognitive impairment as well as asymptomatic left middle cerebral artery aneurysm for which she has had successful craniotomy and coiling recently and answered questions.  I recommend she increase participation in cognitively challenging activities like solving crossword puzzles, playing bridge and sodoku.  Repeat MRI scan of the brain with and without contrast to look for resolution of the previously seen abnormality.  Follow-up with Dr. Kathyrn Sheriff for aneurysm clipping surveillance.  No schedule routine follow-up appointment with me is necessary but she may return the future as needed only. Greater than 50% of time during this 45 minute  prolonged visit was spent on counseling,explanation of diagnosis, of transient global amnesia, mild cognitive impairment and asymptomatic cerebral aneurysm planning of further management, discussion with patient and family and coordination of care Antony Contras, MD Note: This document was prepared with digital dictation and possible smart phrase technology. Any transcriptional errors that result from this process are unintentional

## 2021-06-21 NOTE — Patient Instructions (Signed)
I had a long discussion with the patient and her husband regarding her episode of memory loss likely presenting with transient global amnesia as well as underlying mild cognitive impairment as well as asymptomatic left middle cerebral artery aneurysm for which she has had successful craniotomy and coiling recently and answered questions.  I recommend she increase participation in cognitively challenging activities like solving crossword puzzles, playing bridge and sodoku.  Repeat MRI scan of the brain with and without contrast to look for resolution of the previously seen abnormality.  Follow-up with Dr. Kathyrn Sheriff for aneurysm clipping surveillance.  No schedule routine follow-up appointment with me is necessary but she may return the future as needed only. Transient Global Amnesia Transient global amnesia causes a sudden and temporary (transient) loss of memory (amnesia). You may recall memories from your distant past and people you know well. However, you may not recall things that happened more recently in the past days, months, or even year. A transient global amnesia episode does not last longer than 24 hours. Transient global amnesia does not affect your other brain functions. Your memory usually returns to normal after an episode is over. One episode of transient global amnesia does not make you more likely to have a stroke, a relapse, or other complications. What are the causes? The cause of this condition is not known. Certain activities have been reported to trigger transient global amnesia. These activities include: Swimming in very cold or hot water. Sexual intercourse. Emotional distress, such as receiving bad news or having a lot of stress at once. Strenuous exercise or activity. What increases the risk? You are more likely to develop this condition if: You are 60-57 years old. You have a history of migraine headaches. What are the signs or symptoms? The main symptoms of this condition  include: Being unable to remember recent events. Asking repetitive questions about a situation and surroundings and not recalling the answers to these questions. Other symptoms include: Restlessness and nervousness. Confusion. Headaches. Dizziness. Nausea. How is this diagnosed? This condition may be diagnosed based on: Your symptoms. A physical exam. A test to check your mental abilities (cognitive evaluation). Imaging studies to check brain function. These may include: Electroencephalogram (EEG). This test checks the brain's electrical activity. CT scan. MRI. How is this treated? There is no treatment for this condition. An episode typically goes away on its own after a few hours. You may receive medicines to treat other conditions, such as a migraine. Follow these instructions at home: Take over-the-counter and prescription medicines only as told by your health care provider. Avoid taking medicines that can affect thinking, such as pain or sleeping medicines. Learn what activities may trigger an episode. Avoid these activities as told by your health care provider. Find ways to manage stress, such as meditation or yoga. Keep all follow-up visits as told by your health care provider. This is important. Contact a health care provider if you: Have a migraine that does not go away. Experience transient global amnesia repeatedly. Get help right away if you: Have a seizure. Summary Transient global amnesia causes a sudden and temporary (transient) loss of memory (amnesia). There is no treatment for this condition. An episode typically goes away on its own after a few hours. You may receive medicines to treat other conditions, such as a migraine. Transient global amnesia does not affect your other brain functions. Your memory usually returns to normal after an episode is over. This information is not intended to replace advice given  to you by your health care provider. Make sure you  discuss any questions you have with your health care provider. Document Revised: 11/01/2020 Document Reviewed: 11/01/2020 Elsevier Patient Education  2022 [employer].

## 2021-06-22 DIAGNOSIS — I671 Cerebral aneurysm, nonruptured: Secondary | ICD-10-CM | POA: Diagnosis not present

## 2021-06-22 DIAGNOSIS — Z7982 Long term (current) use of aspirin: Secondary | ICD-10-CM | POA: Diagnosis not present

## 2021-06-22 DIAGNOSIS — E785 Hyperlipidemia, unspecified: Secondary | ICD-10-CM | POA: Diagnosis not present

## 2021-06-22 DIAGNOSIS — I10 Essential (primary) hypertension: Secondary | ICD-10-CM | POA: Diagnosis not present

## 2021-06-22 DIAGNOSIS — R569 Unspecified convulsions: Secondary | ICD-10-CM | POA: Diagnosis not present

## 2021-06-22 DIAGNOSIS — Z48811 Encounter for surgical aftercare following surgery on the nervous system: Secondary | ICD-10-CM | POA: Diagnosis not present

## 2021-06-28 ENCOUNTER — Telehealth: Payer: Self-pay | Admitting: Neurology

## 2021-06-28 DIAGNOSIS — Z7982 Long term (current) use of aspirin: Secondary | ICD-10-CM | POA: Diagnosis not present

## 2021-06-28 DIAGNOSIS — I671 Cerebral aneurysm, nonruptured: Secondary | ICD-10-CM | POA: Diagnosis not present

## 2021-06-28 DIAGNOSIS — Z48811 Encounter for surgical aftercare following surgery on the nervous system: Secondary | ICD-10-CM | POA: Diagnosis not present

## 2021-06-28 DIAGNOSIS — I10 Essential (primary) hypertension: Secondary | ICD-10-CM | POA: Diagnosis not present

## 2021-06-28 DIAGNOSIS — E785 Hyperlipidemia, unspecified: Secondary | ICD-10-CM | POA: Diagnosis not present

## 2021-06-28 DIAGNOSIS — R569 Unspecified convulsions: Secondary | ICD-10-CM | POA: Diagnosis not present

## 2021-06-28 NOTE — Telephone Encounter (Signed)
Medicare/BCBS auth: [employer] spoke to Ivin Booty ref # [id number] order sent to GI, they will reach out to the patient to schedule.

## 2021-06-28 NOTE — Telephone Encounter (Signed)
Error

## 2021-06-29 DIAGNOSIS — Z7982 Long term (current) use of aspirin: Secondary | ICD-10-CM | POA: Diagnosis not present

## 2021-06-29 DIAGNOSIS — E785 Hyperlipidemia, unspecified: Secondary | ICD-10-CM | POA: Diagnosis not present

## 2021-06-29 DIAGNOSIS — I10 Essential (primary) hypertension: Secondary | ICD-10-CM | POA: Diagnosis not present

## 2021-06-29 DIAGNOSIS — R569 Unspecified convulsions: Secondary | ICD-10-CM | POA: Diagnosis not present

## 2021-06-29 DIAGNOSIS — Z48811 Encounter for surgical aftercare following surgery on the nervous system: Secondary | ICD-10-CM | POA: Diagnosis not present

## 2021-06-29 DIAGNOSIS — I671 Cerebral aneurysm, nonruptured: Secondary | ICD-10-CM | POA: Diagnosis not present

## 2021-07-01 DIAGNOSIS — R569 Unspecified convulsions: Secondary | ICD-10-CM | POA: Diagnosis not present

## 2021-07-01 DIAGNOSIS — E785 Hyperlipidemia, unspecified: Secondary | ICD-10-CM | POA: Diagnosis not present

## 2021-07-01 DIAGNOSIS — I671 Cerebral aneurysm, nonruptured: Secondary | ICD-10-CM | POA: Diagnosis not present

## 2021-07-01 DIAGNOSIS — I10 Essential (primary) hypertension: Secondary | ICD-10-CM | POA: Diagnosis not present

## 2021-07-01 DIAGNOSIS — Z48811 Encounter for surgical aftercare following surgery on the nervous system: Secondary | ICD-10-CM | POA: Diagnosis not present

## 2021-07-01 DIAGNOSIS — Z7982 Long term (current) use of aspirin: Secondary | ICD-10-CM | POA: Diagnosis not present

## 2021-07-06 DIAGNOSIS — Z48811 Encounter for surgical aftercare following surgery on the nervous system: Secondary | ICD-10-CM | POA: Diagnosis not present

## 2021-07-06 DIAGNOSIS — I10 Essential (primary) hypertension: Secondary | ICD-10-CM | POA: Diagnosis not present

## 2021-07-06 DIAGNOSIS — E785 Hyperlipidemia, unspecified: Secondary | ICD-10-CM | POA: Diagnosis not present

## 2021-07-06 DIAGNOSIS — I671 Cerebral aneurysm, nonruptured: Secondary | ICD-10-CM | POA: Diagnosis not present

## 2021-07-06 DIAGNOSIS — R569 Unspecified convulsions: Secondary | ICD-10-CM | POA: Diagnosis not present

## 2021-07-06 DIAGNOSIS — Z7982 Long term (current) use of aspirin: Secondary | ICD-10-CM | POA: Diagnosis not present

## 2021-07-18 DIAGNOSIS — H26493 Other secondary cataract, bilateral: Secondary | ICD-10-CM | POA: Diagnosis not present

## 2021-07-18 DIAGNOSIS — H18523 Epithelial (juvenile) corneal dystrophy, bilateral: Secondary | ICD-10-CM | POA: Diagnosis not present

## 2021-07-23 ENCOUNTER — Ambulatory Visit
Admission: RE | Admit: 2021-07-23 | Discharge: 2021-07-23 | Disposition: A | Discharge status: Home / Self Care | Payer: Medicare Other | Source: Ambulatory Visit | Attending: Neurology | Admitting: Neurology

## 2021-07-23 ENCOUNTER — Other Ambulatory Visit: Payer: Self-pay

## 2021-07-23 DIAGNOSIS — G454 Transient global amnesia: Secondary | ICD-10-CM

## 2021-07-23 MED ORDER — GADOBENATE DIMEGLUMINE 529 MG/ML IV SOLN
15.0000 mL | Freq: Once | INTRAVENOUS | Status: AC | PRN
  Administered 2021-07-23: 15 mL via INTRAVENOUS

## 2021-07-25 ENCOUNTER — Telehealth: Payer: Self-pay | Admitting: Neurology

## 2021-07-25 NOTE — Telephone Encounter (Signed)
I called the patient and gave her report of the MRI scan of the brain done on 07/23/2021 which shows stable changes of chronic small vessel disease and new postoperative changes of left frontal craniotomy with left MCA aneurysm clipping with metallic artifacts as well as left parietal extradural fluid collection at the surgical site.  There is mild reactive dural thickening at the site.  Patient states she is doing fine and has no complaints.  I advised her to discuss these findings with her neurosurgeon Dr. Kathyrn Sheriff and she voiced understanding.  Also spoke to Dr. Kathyrn Sheriff who stated he will take a look at the MRI and see if anything needs to be done.

## 2021-09-21 ENCOUNTER — Other Ambulatory Visit: Payer: Self-pay

## 2021-09-21 ENCOUNTER — Encounter: Payer: Self-pay | Admitting: Pulmonary Disease

## 2021-09-21 ENCOUNTER — Ambulatory Visit (INDEPENDENT_AMBULATORY_CARE_PROVIDER_SITE_OTHER): Payer: Medicare Other | Admitting: Pulmonary Disease

## 2021-09-21 VITALS — BP 130/70 | HR 81 | Temp 97.6°F | Ht 64.0 in | Wt 164.6 lb

## 2021-09-21 DIAGNOSIS — J849 Interstitial pulmonary disease, unspecified: Secondary | ICD-10-CM | POA: Diagnosis not present

## 2021-09-21 DIAGNOSIS — J42 Unspecified chronic bronchitis: Secondary | ICD-10-CM | POA: Diagnosis not present

## 2021-09-21 MED ORDER — ADVAIR HFA 115-21 MCG/ACT IN AERO: 2.0000 | INHALATION_SPRAY | Freq: Two times a day (BID) | RESPIRATORY_TRACT | 12 refills | Status: DC

## 2021-09-21 NOTE — Patient Instructions (Addendum)
Mild ILD --ARRANGE for PFTs 02/2022  Chronic Bronchitis --DECREASE Advair 115-21 mcg TWO puffs TWICE a day --CONTINUE Albuterol AS NEEDED  When you are sick Increase to Advair 2 puffs three times a day for worsening shortness of breath, wheezing and cough. If you symptoms do not improve in 24-48 hours, please our office for evaluation and/or prednisone taper.  Follow-up with me in June 2023 with PFTs prior to appointment

## 2021-09-21 NOTE — Progress Notes (Signed)
Subjective:   PATIENT ID: Ann [name] GENDER: female DOB: 01/03/1946, MRN: [id number]   HPI  Chief Complaint  Patient presents with   Follow-up    ILD    Reason for Visit: Follow-up  [name] is a 76 year old female former smoker with ILD who presents for follow-up.  Synopsis: Initially referred for chronic cough and diagnosed with mild ILD. Autoimmune work-up revealed +ANA 1:160 with myositis panel, sjogren's antibodies and scleroderma screen returning negative. Rheumatology with no further work-up indicated.  03/21/21 Since our last visit six months ago, she reports her respiratory symptoms are overall stable on Advair. She was previously on [facility] but switched to available inhaler without any worsening in symptoms. Over a week ago she did develop a recent cold (COVID-19 neg) that has improved. No shortness of breath or wheezing but has a residual cough with greenish sputum. Otherwise no complaints. Denies exacerbations or ED visits requiring steroids or antibiotics for respiratory infections.  09/21/21 Since our last visit she reports viral illness for ~10 days. She had nonproductive cough, no wheezing. She had recent sick contact but did not take COVID-19. Symptoms have resolved. She had a hospitalization in end Sept for craniotomy for incidental L MCA aneurysm. Has been compliant with Advair. Never uses albuterol. No other respiratory illnesses aside from this viral illness  Social History: Former smoker. Quit 1976.  Past Medical History:  Diagnosis Date   Bronchitis, chronic ([place])    Hyperlipidemia    Hypertension    Interstitial lung disease ([place]) 2020   pt sees a pulmonologist   Pneumonia    Pulmonary nodule    Sepsis ([place])      Outpatient Medications Prior to Visit  Medication Sig Dispense Refill   acetaminophen (TYLENOL) 500 MG tablet Take 1,000 mg by mouth every 6 (six) hours as needed for mild pain.     albuterol (VENTOLIN HFA) 108 (90  Base) MCG/ACT inhaler Inhale 1-2 puffs into the lungs every 6 (six) hours as needed for wheezing or shortness of breath. 8 g 3   aspirin EC 81 MG tablet Take 81 mg by mouth daily. Swallow whole.     atorvastatin (LIPITOR) 20 MG tablet Take 20 mg by mouth daily.     calcium carbonate (OS-CAL - DOSED IN MG OF ELEMENTAL CALCIUM) 1250 (500 Ca) MG tablet Take 1 tablet by mouth daily with breakfast.     cetirizine (ZYRTEC) 10 MG tablet Take 10 mg by mouth daily.     Cholecalciferol (VITAMIN D3) 50 MCG (2000 UT) TABS Take 2,000 Units by mouth daily.     clobetasol ointment (TEMOVATE) 9.37 % Apply 1 application topically daily. Apply daily to skin to affected area every day for 30 days     fluticasone-salmeterol (ADVAIR HFA) 230-21 MCG/ACT inhaler Inhale 2 puffs into the lungs 2 (two) times daily. 1 each 12   HYDROcodone-acetaminophen (NORCO/VICODIN) 5-325 MG tablet Take 1 tablet by mouth every 6 (six) hours as needed for moderate pain. 20 tablet 0   levETIRAcetam (KEPPRA) 500 MG tablet Take 1 tablet (500 mg total) by mouth 2 (two) times daily. 50 tablet 0   lisinopril-hydrochlorothiazide (ZESTORETIC) 20-25 MG tablet Take 1 tablet by mouth daily.     No facility-administered medications prior to visit.    Review of Systems  Constitutional:  Negative for chills, diaphoresis, fever, malaise/fatigue and weight loss.  HENT:  Negative for congestion.   Respiratory:  Positive for cough. Negative for hemoptysis, sputum production,  shortness of breath and wheezing.   Cardiovascular:  Negative for chest pain, palpitations and leg swelling.   Objective:   Vitals:   09/21/21 1039  BP: 130/70  Pulse: 81  Temp: 97.6 F (36.4 C)  TempSrc: Oral  SpO2: 96%  Weight: 164 lb 9.6 oz (74.7 kg)  Height: 5\' 4"  (1.626 m)   SpO2: 96 % O2 Device: None (Room air)  Physical Exam: General: Well-appearing, no acute distress HENT: [place], AT Eyes: EOMI, no scleral icterus Respiratory: Clear to auscultation  bilaterally.  No crackles, wheezing or rales Cardiovascular: RRR, -M/R/G, no JVD Extremities:-Edema,-tenderness Neuro: AAO x4, CNII-XII grossly intact Psych: Normal mood, normal affect  Data Reviewed:  Imaging: CTA 03/26/19 - No PE, borderline mediastinal (95mm) and right hilar (74mm) lymph nodes, stable compared to 02/16/19 CT Chest 09/01/19 - Minimal bibasilar interstitial thickening/scarring at the bases with minimal bronchiectasis R>L.  Mild bilateral GGO with improvement in LUL nodule/GGO HRCT Chest 02/13/20 - Mild subpleural GGO with mild traction bronchiectasis, basilar predominance. No honeycombing. Stable lung nodules  PFT: 09/22/19 FVC 2.46 (85%) FEV1 1.95 (89%) Ratio 76  TLC 76% DLCO 74% Interpretation: Mild restrictive defect with mildly reduced uncorrected DLCO  09/06/20 FVC 2.32 (81%) FEV1 1.88 (88%) Ratio 77  TLC 98% DLCO 70% Interpretation: Mild restrictive defect with mildly reduced DLCO (corrected)  Labs: 03/26/19 Abs eos 1500 04/26/21 Abs eso 400   Assessment & Plan:  Discussion: 76 year old female with mild ILD (ANA+, no further work-up per Rheum). Recently prolonged viral illness but recovering. Usually symptoms are controlled on ICS/LABA. We discussed risks and benefits of stepping down therapy.   Mild ILD --ARRANGE for PFTs 02/2022  Chronic Bronchitis --DECREASE Advair 115-21 mcg TWO puffs TWICE a day --CONTINUE Albuterol AS NEEDED  When you are sick Increase to Advair 2 puffs three times a day for worsening shortness of breath, wheezing and cough. If you symptoms do not improve in 24-48 hours, please our office for evaluation and/or prednisone taper.  Health Maintenance Immunization History  Administered Date(s) Administered   Influenza Split 05/28/2019, 05/27/2020, 05/11/2021   Influenza, High Dose Seasonal PF 06/18/2018, 05/28/2019   Influenza-Unspecified 09/05/2015, 07/05/2017   PFIZER(Purple Top)SARS-COV-2 Vaccination 10/11/2019, 11/01/2019,  06/19/2020, 04/13/2021   Pneumococcal Conjugate-13 07/04/2016, 07/17/2016   Pneumococcal Polysaccharide-23 12/09/2019   Tdap 02/11/2020   Zoster, Live 02/11/2020, 05/27/2020   No orders of the defined types were placed in this encounter.  Meds ordered this encounter  Medications   fluticasone-salmeterol (ADVAIR HFA) 115-21 MCG/ACT inhaler    Sig: Inhale 2 puffs into the lungs 2 (two) times daily.    Dispense:  1 each    Refill:  12   Return in about 5 months (around 02/19/2022).  I have spent a total time of 32-minutes on the day of the appointment reviewing prior documentation, coordinating care and discussing medical diagnosis and plan with the patient/family. Past medical history, allergies, medications were reviewed. Pertinent imaging, labs and tests included in this note have been reviewed and interpreted independently by me.  [place], MD [facility] 09/21/2021 10:41 AM  Office Number [phone]

## 2021-09-27 ENCOUNTER — Encounter: Payer: Self-pay | Admitting: Pulmonary Disease

## 2021-12-02 ENCOUNTER — Encounter: Payer: Self-pay | Admitting: Pulmonary Disease

## 2021-12-02 NOTE — Telephone Encounter (Signed)
Pharmacy team, can you see what the covered alternatives are for Advair? Pt states she would be paying $77 for this instead of $48. Not sure if it is actually covered.  ?

## 2021-12-05 ENCOUNTER — Other Ambulatory Visit (HOSPITAL_COMMUNITY): Payer: Self-pay

## 2021-12-05 NOTE — Telephone Encounter (Signed)
[name], CPhT  [name], RN 5 hours ago (8:45 AM)  ? ?All of the inhalers have a $46 copay.  [name], Symbicort, Advair, etc....   ? ? ?Pt is on Advair HFA. Please advise if the Advair that will be $46 copay is the Advair HFA or if it is actually Advair Diskus as pt sent message back stating that Advair is now not on list of covered drugs with [employer]. ?

## 2021-12-05 NOTE — Telephone Encounter (Signed)
Dr. Loanne Drilling, please see mychart messages sent by pt and advise. ?

## 2021-12-05 NOTE — Telephone Encounter (Signed)
Spoke with Dr. Loanne Drilling about messages from pt and stated to her that pt's copay is $46. Read the last message sent by pt to Dr. Loanne Drilling. She is requesting to have someone from prior auth team call the pt to see if there is any confusion that is not being understood as pt is stating that [employer] is still not wanting to cover the Advair HFA even after we told her what her copay was. ? ? ?Routing to prior auth team to have them call pt. ?

## 2021-12-06 MED ORDER — ADVAIR HFA 115-21 MCG/ACT IN AERO: 2.0000 | INHALATION_SPRAY | Freq: Two times a day (BID) | RESPIRATORY_TRACT | 12 refills | Status: DC

## 2021-12-06 NOTE — Addendum Note (Signed)
Addended by: Lorretta Harp on: 12/06/2021 09:21 AM ? ? Modules accepted: Orders ? ?

## 2022-01-03 DIAGNOSIS — R7303 Prediabetes: Secondary | ICD-10-CM | POA: Diagnosis not present

## 2022-01-03 DIAGNOSIS — M8580 Other specified disorders of bone density and structure, unspecified site: Secondary | ICD-10-CM | POA: Diagnosis not present

## 2022-01-03 DIAGNOSIS — I10 Essential (primary) hypertension: Secondary | ICD-10-CM | POA: Diagnosis not present

## 2022-01-03 DIAGNOSIS — E2839 Other primary ovarian failure: Secondary | ICD-10-CM | POA: Diagnosis not present

## 2022-01-03 DIAGNOSIS — Z1211 Encounter for screening for malignant neoplasm of colon: Secondary | ICD-10-CM | POA: Diagnosis not present

## 2022-01-03 DIAGNOSIS — Z0000 Encounter for general adult medical examination without abnormal findings: Secondary | ICD-10-CM | POA: Diagnosis not present

## 2022-01-03 DIAGNOSIS — E7800 Pure hypercholesterolemia, unspecified: Secondary | ICD-10-CM | POA: Diagnosis not present

## 2022-01-03 DIAGNOSIS — Z1389 Encounter for screening for other disorder: Secondary | ICD-10-CM | POA: Diagnosis not present

## 2022-01-03 DIAGNOSIS — I700 Atherosclerosis of aorta: Secondary | ICD-10-CM | POA: Diagnosis not present

## 2022-01-03 DIAGNOSIS — N952 Postmenopausal atrophic vaginitis: Secondary | ICD-10-CM | POA: Diagnosis not present

## 2022-01-03 DIAGNOSIS — J849 Interstitial pulmonary disease, unspecified: Secondary | ICD-10-CM | POA: Diagnosis not present

## 2022-01-16 DIAGNOSIS — Z780 Asymptomatic menopausal state: Secondary | ICD-10-CM | POA: Diagnosis not present

## 2022-01-16 DIAGNOSIS — M85851 Other specified disorders of bone density and structure, right thigh: Secondary | ICD-10-CM | POA: Diagnosis not present

## 2022-02-02 DIAGNOSIS — Z1231 Encounter for screening mammogram for malignant neoplasm of breast: Secondary | ICD-10-CM | POA: Diagnosis not present

## 2022-02-08 DIAGNOSIS — N6314 Unspecified lump in the right breast, lower inner quadrant: Secondary | ICD-10-CM | POA: Diagnosis not present

## 2022-02-08 DIAGNOSIS — R928 Other abnormal and inconclusive findings on diagnostic imaging of breast: Secondary | ICD-10-CM | POA: Diagnosis not present

## 2022-02-08 DIAGNOSIS — N6323 Unspecified lump in the left breast, lower outer quadrant: Secondary | ICD-10-CM | POA: Diagnosis not present

## 2022-02-10 ENCOUNTER — Other Ambulatory Visit: Payer: Self-pay

## 2022-02-10 DIAGNOSIS — J849 Interstitial pulmonary disease, unspecified: Secondary | ICD-10-CM

## 2022-02-13 ENCOUNTER — Encounter: Payer: Self-pay | Admitting: Pulmonary Disease

## 2022-02-13 ENCOUNTER — Ambulatory Visit (INDEPENDENT_AMBULATORY_CARE_PROVIDER_SITE_OTHER): Payer: Medicare Other | Admitting: Pulmonary Disease

## 2022-02-13 VITALS — BP 120/76 | HR 78 | Temp 97.6°F | Ht 64.25 in | Wt 168.2 lb

## 2022-02-13 DIAGNOSIS — J42 Unspecified chronic bronchitis: Secondary | ICD-10-CM | POA: Diagnosis not present

## 2022-02-13 DIAGNOSIS — J849 Interstitial pulmonary disease, unspecified: Secondary | ICD-10-CM

## 2022-02-13 LAB — PULMONARY FUNCTION TEST
DL/VA % pred: 76 %
DL/VA: 3.11 ml/min/mmHg/L
DLCO cor % pred: 60 %
DLCO cor: 11.71 ml/min/mmHg
DLCO unc % pred: 60 %
DLCO unc: 11.71 ml/min/mmHg
FEF 25-75 Post: 1.47 L/sec
FEF 25-75 Pre: 2.1 L/sec
FEF2575-%Change-Post: -29 %
FEF2575-%Pred-Post: 90 %
FEF2575-%Pred-Pre: 129 %
FEV1-%Change-Post: -6 %
FEV1-%Pred-Post: 88 %
FEV1-%Pred-Pre: 94 %
FEV1-Post: 1.86 L
FEV1-Pre: 1.98 L
FEV1FVC-%Change-Post: 2 %
FEV1FVC-%Pred-Pre: 109 %
FEV6-%Change-Post: -8 %
FEV6-%Pred-Post: 83 %
FEV6-%Pred-Pre: 91 %
FEV6-Post: 2.22 L
FEV6-Pre: 2.43 L
FEV6FVC-%Change-Post: 0 %
FEV6FVC-%Pred-Post: 105 %
FEV6FVC-%Pred-Pre: 105 %
FVC-%Change-Post: -8 %
FVC-%Pred-Post: 79 %
FVC-%Pred-Pre: 86 %
FVC-Post: 2.22 L
FVC-Pre: 2.43 L
Post FEV1/FVC ratio: 84 %
Post FEV6/FVC ratio: 100 %
Pre FEV1/FVC ratio: 82 %
Pre FEV6/FVC Ratio: 100 %
RV % pred: 102 %
RV: 2.39 L
TLC % pred: 93 %
TLC: 4.78 L

## 2022-02-13 MED ORDER — ADVAIR HFA 115-21 MCG/ACT IN AERO: 2.0000 | INHALATION_SPRAY | Freq: Two times a day (BID) | RESPIRATORY_TRACT | 11 refills | Status: DC

## 2022-02-13 NOTE — Progress Notes (Signed)
PFT done today. 

## 2022-02-13 NOTE — Patient Instructions (Signed)
Mild ILD Mildly reduced DLCO --ORDER pulmonary function tests in 1 year (June 2024)  Chronic Bronchitis --CONTINUE PBDHDI978-47 mcg TWO puffs TWICE a day --STOP Albuterol. No longer needed  Follow-up with me in 1 year with PFTs prior to visit Please call for sooner appointment if you have symptoms

## 2022-02-13 NOTE — Progress Notes (Signed)
Subjective:   PATIENT ID: Ann [name] GENDER: female DOB: Apr 06, 1946, MRN: [id number]   HPI  Chief Complaint  Patient presents with   Follow-up    PFT f/u     Reason for Visit: Follow-up  Ann [name] is a 76 year old female former smoker with ILD who presents for follow-up.  Synopsis: Initially referred for chronic cough and diagnosed with mild ILD. Autoimmune work-up revealed +ANA 1:160 with myositis panel, sjogren's antibodies and scleroderma screen returning negative. Rheumatology with no further work-up indicated.  03/21/21 Since our last visit six months ago, she reports her respiratory symptoms are overall stable on Advair. She was previously on [facility] but switched to available inhaler without any worsening in symptoms. Over a week ago she did develop a recent cold (COVID-19 neg) that has improved. No shortness of breath or wheezing but has a residual cough with greenish sputum. Otherwise no complaints. Denies exacerbations or ED visits requiring steroids or antibiotics for respiratory infections.  09/21/21 Since our last visit she reports viral illness for ~10 days. She had nonproductive cough, no wheezing. She had recent sick contact but did not take COVID-19. Symptoms have resolved. She had a hospitalization in end Sept for craniotomy for incidental L MCA aneurysm. Has been compliant with Advair. Never uses albuterol. No other respiratory illnesses aside from this viral illness  02/13/22 She has been compliant with reduced strength Advair and reports well-controlled symptoms. Denies coughing, wheezing or shortness of breath. Has not needed rescue inhaler at all. She walks every other day. No limitations in activity  Social History: Former smoker. Quit 1976.  Past Medical History:  Diagnosis Date   Bronchitis, chronic ([place])    Hyperlipidemia    Hypertension    Interstitial lung disease ([place]) 2020   pt sees a pulmonologist   Pneumonia    Pulmonary  nodule    Sepsis ([place])      Outpatient Medications Prior to Visit  Medication Sig Dispense Refill   acetaminophen (TYLENOL) 500 MG tablet Take 1,000 mg by mouth every 6 (six) hours as needed for mild pain.     ADVAIR HFA 115-21 MCG/ACT inhaler Inhale 2 puffs into the lungs 2 (two) times daily. 12 g 12   aspirin EC 81 MG tablet Take 81 mg by mouth daily. Swallow whole.     atorvastatin (LIPITOR) 20 MG tablet Take 20 mg by mouth daily.     calcium carbonate (OS-CAL - DOSED IN MG OF ELEMENTAL CALCIUM) 1250 (500 Ca) MG tablet Take 1 tablet by mouth daily with breakfast.     cetirizine (ZYRTEC) 10 MG tablet Take 10 mg by mouth daily.     Cholecalciferol (VITAMIN D3) 50 MCG (2000 UT) TABS Take 2,000 Units by mouth daily.     clobetasol ointment (TEMOVATE) 4.25 % Apply 1 application topically daily. Apply daily to skin to affected area every day for 30 days     lisinopril-hydrochlorothiazide (ZESTORETIC) 20-25 MG tablet Take 1 tablet by mouth daily.     albuterol (VENTOLIN HFA) 108 (90 Base) MCG/ACT inhaler Inhale 1-2 puffs into the lungs every 6 (six) hours as needed for wheezing or shortness of breath. 8 g 3   HYDROcodone-acetaminophen (NORCO/VICODIN) 5-325 MG tablet Take 1 tablet by mouth every 6 (six) hours as needed for moderate pain. 20 tablet 0   No facility-administered medications prior to visit.    Review of Systems  Constitutional:  Negative for chills, diaphoresis, fever, malaise/fatigue and weight loss.  HENT:  Negative for congestion.   Respiratory:  Negative for cough, hemoptysis, sputum production, shortness of breath and wheezing.   Cardiovascular:  Negative for chest pain, palpitations and leg swelling.    Objective:   Vitals:   02/13/22 1126  BP: 120/76  Pulse: 78  Temp: 97.6 F (36.4 C)  TempSrc: Oral  SpO2: 97%  Weight: 168 lb 3.2 oz (76.3 kg)  Height: 5' 4.25" (1.632 m)   SpO2: 97 % O2 Device: None (Room air)  Physical Exam: General: Well-appearing, no  acute distress HENT: [place], AT Eyes: EOMI, no scleral icterus Respiratory: Clear to auscultation bilaterally.  No crackles, wheezing or rales Cardiovascular: RRR, -M/R/G, no JVD Extremities:-Edema,-tenderness Neuro: AAO x4, CNII-XII grossly intact Psych: Normal mood, normal affect  Data Reviewed:  Imaging: CTA 03/26/19 - No PE, borderline mediastinal (43m) and right hilar (149m lymph nodes, stable compared to 02/16/19 CT Chest 09/01/19 - Minimal bibasilar interstitial thickening/scarring at the bases with minimal bronchiectasis R>L.  Mild bilateral GGO with improvement in LUL nodule/GGO HRCT Chest 02/13/20 - Mild subpleural GGO with mild traction bronchiectasis, basilar predominance. No honeycombing. Stable lung nodules  PFT: 09/22/19 FVC 2.46 (85%) FEV1 1.95 (89%) Ratio 76  TLC 76% DLCO 74% Interpretation: Mild restrictive defect with mildly reduced uncorrected DLCO  09/06/20 FVC 2.32 (81%) FEV1 1.88 (88%) Ratio 77  TLC 98% DLCO 70% Interpretation: Mild restrictive defect with mildly reduced DLCO (corrected)  02/13/22 FVC 2.22 (86%) FEV1 1.98 (94%) Ratio 82  TLC 93% DLCO 60% Interpretation: Mild reduced DLCO  Labs: 03/26/19 Abs eos 1500 04/26/21 Abs eso 400   Assessment & Plan:  Discussion: [age]ear old female with mild ILD (ANA+, no further work-up per Rheum). Recently had a prolonged viral illness but has recovered. Symptoms currently controlled on ICS/LABA. We reviewed PFTs which demonstrate reduced DLCO. We discussed management and since she is asymptomatic on current regimen, will not make any changes. Resting O2 97%. Will repeat PFTs at next visit  Mild ILD Mildly reduced DLCO --ORDER pulmonary function tests in 1 year (June 2024)  Chronic Bronchitis --CONTINUE AdHERDEY814-48cg TWO puffs TWICE a day --STOP Albuterol. No longer needed  When you are sick Increase to Advair 2 puffs three times a day for worsening shortness of breath, wheezing and cough. If you symptoms do not  improve in 24-48 hours, please our office for evaluation and/or prednisone taper.  Health Maintenance Immunization History  Administered Date(s) Administered   Influenza Split 05/28/2019, 05/27/2020, 05/11/2021   Influenza, High Dose Seasonal PF 06/18/2018, 05/28/2019   Influenza-Unspecified 09/05/2015, 07/05/2017   PFIZER(Purple Top)SARS-COV-2 Vaccination 10/11/2019, 11/01/2019, 06/19/2020, 04/13/2021   Pneumococcal Conjugate-13 07/04/2016, 07/17/2016   Pneumococcal Polysaccharide-23 12/09/2019   Tdap 02/11/2020   Zoster, Live 02/11/2020, 05/27/2020   Orders Placed This Encounter  Procedures   Pulmonary function test    Standing Status:   Future    Standing Expiration Date:   02/14/2023    Order Specific Question:   Where should this test be performed?    Answer:   [facility]   Meds ordered this encounter  Medications   ADVAIR HFA 115-21 MCG/ACT inhaler    Sig: Inhale 2 puffs into the lungs 2 (two) times daily.    Dispense:  12 g    Refill:  11   Return in about 1 year (around 02/14/2023).  I have spent a total time of 30-minutes on the day of the appointment including chart review, data review, collecting history, coordinating care and discussing medical diagnosis and  plan with the patient/family. Past medical history, allergies, medications were reviewed. Pertinent imaging, labs and tests included in this note have been reviewed and interpreted independently by me.  [place], MD [facility] 02/13/2022 11:38 AM  Office Number [phone]

## 2022-02-14 DIAGNOSIS — N6323 Unspecified lump in the left breast, lower outer quadrant: Secondary | ICD-10-CM | POA: Diagnosis not present

## 2022-02-14 DIAGNOSIS — N6002 Solitary cyst of left breast: Secondary | ICD-10-CM | POA: Diagnosis not present

## 2022-02-14 DIAGNOSIS — N6001 Solitary cyst of right breast: Secondary | ICD-10-CM | POA: Diagnosis not present

## 2022-03-15 ENCOUNTER — Ambulatory Visit: Payer: Medicare Other | Admitting: Pulmonary Disease

## 2022-03-27 DIAGNOSIS — M546 Pain in thoracic spine: Secondary | ICD-10-CM | POA: Diagnosis not present

## 2022-04-02 ENCOUNTER — Emergency Department (HOSPITAL_COMMUNITY): Payer: Medicare Other

## 2022-04-02 ENCOUNTER — Emergency Department (HOSPITAL_COMMUNITY)
Admission: EM | Admit: 2022-04-02 | Discharge: 2022-04-02 | Disposition: A | Discharge status: Home / Self Care | Payer: Medicare Other | Attending: Emergency Medicine | Admitting: Emergency Medicine

## 2022-04-02 ENCOUNTER — Encounter (HOSPITAL_COMMUNITY): Payer: Self-pay

## 2022-04-02 ENCOUNTER — Other Ambulatory Visit: Payer: Self-pay

## 2022-04-02 DIAGNOSIS — Z7982 Long term (current) use of aspirin: Secondary | ICD-10-CM | POA: Insufficient documentation

## 2022-04-02 DIAGNOSIS — I10 Essential (primary) hypertension: Secondary | ICD-10-CM | POA: Diagnosis not present

## 2022-04-02 DIAGNOSIS — R079 Chest pain, unspecified: Secondary | ICD-10-CM | POA: Diagnosis not present

## 2022-04-02 DIAGNOSIS — Z79899 Other long term (current) drug therapy: Secondary | ICD-10-CM | POA: Diagnosis not present

## 2022-04-02 DIAGNOSIS — M546 Pain in thoracic spine: Secondary | ICD-10-CM | POA: Insufficient documentation

## 2022-04-02 DIAGNOSIS — E876 Hypokalemia: Secondary | ICD-10-CM | POA: Insufficient documentation

## 2022-04-02 DIAGNOSIS — M549 Dorsalgia, unspecified: Secondary | ICD-10-CM

## 2022-04-02 DIAGNOSIS — D72829 Elevated white blood cell count, unspecified: Secondary | ICD-10-CM | POA: Insufficient documentation

## 2022-04-02 DIAGNOSIS — R000 Tachycardia, unspecified: Secondary | ICD-10-CM | POA: Insufficient documentation

## 2022-04-02 LAB — CBC WITH DIFFERENTIAL/PLATELET
Abs Immature Granulocytes: 0.08 10*3/uL — ABNORMAL HIGH (ref 0.00–0.07)
Basophils Absolute: 0.1 10*3/uL (ref 0.0–0.1)
Basophils Relative: 1 %
Eosinophils Absolute: 0.1 10*3/uL (ref 0.0–0.5)
Eosinophils Relative: 1 %
HCT: 47 % — ABNORMAL HIGH (ref 36.0–46.0)
Hemoglobin: 15.8 g/dL — ABNORMAL HIGH (ref 12.0–15.0)
Immature Granulocytes: 1 %
Lymphocytes Relative: 9 %
Lymphs Abs: 1.5 10*3/uL (ref 0.7–4.0)
MCH: 30.2 pg (ref 26.0–34.0)
MCHC: 33.6 g/dL (ref 30.0–36.0)
MCV: 89.9 fL (ref 80.0–100.0)
Monocytes Absolute: 0.7 10*3/uL (ref 0.1–1.0)
Monocytes Relative: 4 %
Neutro Abs: 14.5 10*3/uL — ABNORMAL HIGH (ref 1.7–7.7)
Neutrophils Relative %: 84 %
Platelets: 331 10*3/uL (ref 150–400)
RBC: 5.23 MIL/uL — ABNORMAL HIGH (ref 3.87–5.11)
RDW: 13.2 % (ref 11.5–15.5)
WBC: 17 10*3/uL — ABNORMAL HIGH (ref 4.0–10.5)
nRBC: 0 % (ref 0.0–0.2)

## 2022-04-02 LAB — BASIC METABOLIC PANEL
Anion gap: 10 (ref 5–15)
BUN: 33 mg/dL — ABNORMAL HIGH (ref 8–23)
CO2: 28 mmol/L (ref 22–32)
Calcium: 9.9 mg/dL (ref 8.9–10.3)
Chloride: 100 mmol/L (ref 98–111)
Creatinine, Ser: 0.8 mg/dL (ref 0.44–1.00)
GFR, Estimated: >60 mL/min (ref >60)
Glucose, Bld: 135 mg/dL — ABNORMAL HIGH (ref 70–99)
Potassium: 3.3 mmol/L — ABNORMAL LOW (ref 3.5–5.1)
Sodium: 138 mmol/L (ref 135–145)

## 2022-04-02 LAB — TROPONIN I (HIGH SENSITIVITY)
Troponin I (High Sensitivity): 4 ng/L (ref <18)
Troponin I (High Sensitivity): 3 ng/L (ref <18)

## 2022-04-02 MED ORDER — LIDOCAINE 5 % EX PTCH
1.0000 | MEDICATED_PATCH | Freq: Once | CUTANEOUS | Status: DC
Start: 2022-04-02 — End: 2022-04-02
  Administered 2022-04-02: 1 via TRANSDERMAL
  Filled 2022-04-02: qty 1

## 2022-04-02 MED ORDER — BUPIVACAINE HCL (PF) 0.5 % IJ SOLN
10.0000 mL | Freq: Once | INTRAMUSCULAR | Status: AC
  Administered 2022-04-02: 10 mL
  Filled 2022-04-02: qty 30

## 2022-04-02 MED ORDER — LIDOCAINE-EPINEPHRINE (PF) 2 %-1:200000 IJ SOLN
10.0000 mL | Freq: Once | INTRAMUSCULAR | Status: AC
  Administered 2022-04-02: 10 mL
  Filled 2022-04-02: qty 20

## 2022-04-02 MED ORDER — IOHEXOL 350 MG/ML SOLN
75.0000 mL | Freq: Once | INTRAVENOUS | Status: AC | PRN
  Administered 2022-04-02: 75 mL via INTRAVENOUS

## 2022-04-02 MED ORDER — HYDROCODONE-ACETAMINOPHEN 5-325 MG PO TABS
1.0000 | ORAL_TABLET | Freq: Once | ORAL | Status: AC
  Administered 2022-04-02: 1 via ORAL
  Filled 2022-04-02: qty 1

## 2022-04-02 NOTE — ED Triage Notes (Signed)
Patient c/o left upper back pain x 2 weeks.Patient states she went to [facility] 6 days ago and was prescribed Prednisone and was told to do compresses. Patient states the pain has worsened.

## 2022-04-02 NOTE — Discharge Instructions (Signed)
Continue using medications as prescribed by your primary care doctor.  In addition to this you can use heat over the area.  You can also use a tennis or lacrosse ball and place it in between the area on your back that is causing you pain and the wall and then slowly lower and raise your arm on the left side to help release tension.  Follow-up with orthopedics as planned if symptoms not improving.

## 2022-04-02 NOTE — ED Provider Notes (Signed)
[facility] DEPT Provider Note   CSN: [id number] Arrival date & time: 04/02/22  1540     History  Chief Complaint  Patient presents with   Back Pain    Ann [name] is a 76 y.o. female.  Ann [name] is a 76 y.o. female with a history of hypertension, hyperlipidemia, interstitial lung disease, who presents to the emergency department for evaluation of left upper back pain.  Patient reports pain has been present for 2 weeks.  She describes it as a sharp pain over her left upper back that is focal and described as a sharp stabbing pain.  She reports pain is worse with certain movements and with deep breath.  She reports that she saw her PCP for this and they prescribed her course of steroids and told her to do compresses but on Wednesday she called her PCP back because she felt like despite these medications pain was getting worse.  They prescribed her additional steroids as well as a muscle relaxer which she reports she has continued to have no improvement.  She reports they were planning to refer her to orthopedics but she has not been seen yet.  She denies associated chest pain or shortness of breath but just a persistent sharp pain that is made it very painful for her to move or do typical activities.  No history of PE or DVT, not currently on anticoagulation, does take a daily baby aspirin.  No lower extremity swelling or pain, no recent long distance travel or surgery.  The history is provided by the patient, the spouse and medical records.  Back Pain Associated symptoms: no chest pain, no fever, no numbness and no weakness        Home Medications Prior to Admission medications   Medication Sig Start Date End Date Taking? Authorizing Provider  acetaminophen (TYLENOL) 500 MG tablet Take 1,000 mg by mouth every 6 (six) hours as needed for mild pain.    [provider]  ADVAIR [facility] 086-76 MCG/ACT inhaler Inhale 2 puffs into the  lungs 2 (two) times daily. 02/13/22   Margaretha Seeds, MD  aspirin EC 81 MG tablet Take 81 mg by mouth daily. Swallow whole.    [provider]  atorvastatin (LIPITOR) 20 MG tablet Take 20 mg by mouth daily. 12/26/18   [provider]  calcium carbonate (OS-CAL - DOSED IN MG OF ELEMENTAL CALCIUM) 1250 (500 Ca) MG tablet Take 1 tablet by mouth daily with breakfast.    [provider]  cetirizine (ZYRTEC) 10 MG tablet Take 10 mg by mouth daily.    [provider]  Cholecalciferol (VITAMIN D3) 50 MCG (2000 UT) TABS Take 2,000 Units by mouth daily.    [provider]  clobetasol ointment (TEMOVATE) 1.95 % Apply 1 application topically daily. Apply daily to skin to affected area every day for 30 days    [provider]  lisinopril-hydrochlorothiazide (ZESTORETIC) 20-25 MG tablet Take 1 tablet by mouth daily. 12/26/18   [provider]      Allergies    Patient has no known allergies.    Review of Systems   Review of Systems  Constitutional:  Negative for chills and fever.  Respiratory:  Negative for cough and shortness of breath.   Cardiovascular:  Negative for chest pain.  Gastrointestinal:  Negative for nausea and vomiting.  Musculoskeletal:  Positive for back pain.  Neurological:  Negative for syncope, weakness, light-headedness and numbness.  Physical Exam Updated Vital Signs BP (!) 108/54 (BP Location: Left Arm)   Pulse 115 (!)   Temp 97.8 F (36.6 C) (Oral)   Resp 15   Ht '5\' 4"'$  (1.626 m)   Wt 75.3 kg   SpO2 100%   BMI 28.49 kg/m  Physical Exam Vitals and nursing note reviewed.  Constitutional:      General: She is not in acute distress.    Appearance: Normal appearance. She is well-developed. She is not ill-appearing or diaphoretic.  HENT:     Head: Normocephalic and atraumatic.  Eyes:     General:        Right eye: No discharge.        Left eye: No discharge.  Cardiovascular:     Rate and Rhythm: Regular  rhythm. Tachycardia present.     Heart sounds: Normal heart sounds.  Pulmonary:     Effort: Pulmonary effort is normal. No respiratory distress.     Breath sounds: Normal breath sounds.  Chest:     Chest wall: No tenderness.  Abdominal:     General: Bowel sounds are normal. There is no distension.     Palpations: Abdomen is soft. There is no mass.     Tenderness: There is no abdominal tenderness. There is no guarding.  Musculoskeletal:        General: Tenderness present.       Arms:     Comments: Focal tenderness over the left trapezius muscle, over the upper scapular region, not significantly worsened with palpation but focal area of pain.  Does seem to be somewhat worsened with Spurling maneuver.  Skin:    General: Skin is warm and dry.  Neurological:     Mental Status: She is alert and oriented to person, place, and time.     Coordination: Coordination normal.  Psychiatric:        Mood and Affect: Mood normal.        Behavior: Behavior normal.   During the 3:00  ED Results / Procedures / Treatments   Labs (all labs ordered are listed, but only abnormal results are displayed) Labs Reviewed  BASIC METABOLIC PANEL - Abnormal; Notable for the following components:      Result Value   Potassium 3.3 (*)    Glucose, Bld 135 (*)    BUN 33 (*)    All other components within normal limits  CBC WITH DIFFERENTIAL/PLATELET - Abnormal; Notable for the following components:   WBC 17.0 (*)    RBC 5.23 (*)    Hemoglobin 15.8 (*)    HCT 47.0 (*)    Neutro Abs 14.5 (*)    Abs Immature Granulocytes 0.08 (*)    All other components within normal limits  TROPONIN I (HIGH SENSITIVITY)  TROPONIN I (HIGH SENSITIVITY)    EKG EKG Interpretation  Date/Time:  Sunday April 02 2022 10:46:17 EDT Ventricular Rate:  101 PR Interval:  146 QRS Duration: 95 QT Interval:  340 QTC Calculation: 441 R Axis:   82 Text Interpretation: Sinus tachycardia Borderline right axis deviation Borderline T  abnormalities, anterior leads Baseline wander in lead(s) V2 V4 Confirmed by Octaviano Glow [phone]) on 04/02/2022 10:51:22 AM  Radiology CT Angio Chest PE W and/or Wo Contrast  Result Date: 04/02/2022 CLINICAL DATA:  76 year old female with acute LEFT UPPER back pain for 2 weeks. EXAM: CT ANGIOGRAPHY CHEST WITH CONTRAST TECHNIQUE: Multidetector CT imaging of the chest was performed using the standard protocol during bolus administration of intravenous contrast.  Multiplanar CT image reconstructions and MIPs were obtained to evaluate the vascular anatomy. RADIATION DOSE REDUCTION: This exam was performed according to the departmental dose-optimization program which includes automated exposure control, adjustment of the mA and/or kV according to patient size and/or use of iterative reconstruction technique. CONTRAST:  60m OMNIPAQUE IOHEXOL 350 MG/ML SOLN COMPARISON:  02/13/2020 and prior CTs FINDINGS: Cardiovascular: This is a technically satisfactory study. No pulmonary emboli are identified. Heart size is within normal limits. Aortic atherosclerotic calcifications are present. There is no evidence of thoracic aortic aneurysm or pericardial effusion. Mediastinum/Nodes: No enlarged mediastinal, hilar, or axillary lymph nodes. Thyroid gland, trachea, and esophagus demonstrate no significant findings. A large hiatal hernia is again noted. Lungs/Pleura: Hazy/ground-glass opacities within the dependent and basilar portions of the lungs noted. No definite airspace disease, consolidation, mass, suspicious nodule, pleural effusion or pneumothorax identified. Upper Abdomen: No acute abnormality. Patient is status post cholecystectomy. Musculoskeletal: No acute or suspicious bony abnormalities are noted. Review of the MIP images confirms the above findings. IMPRESSION: 1. No evidence of pulmonary emboli or thoracic aortic aneurysm. 2. Hazy/ground-glass opacities within the dependent and basilar portions of the lungs,  nonspecific but favor atelectasis over infection or edema. 3. Large hiatal hernia. 4. Aortic Atherosclerosis (ICD10-I70.0). Electronically Signed   By: JMargarette CanadaM.D.   On: 04/02/2022 12:48    Procedures Procedures   Procedure Note: Trigger Point Injection for Myofascial pain  Performed by KJacqlyn Larsen PA-C  Indication: muscle/myofascial pain Muscle body and tendon sheath of the left trapezius muscle was injected with a 50-50 mix of 2% lidocaine with epi and 0.5% bupivacaine under sterile technique for release of muscle spasm/pain. Patient tolerated well with immediate improvement of symptoms and no immediate complications following procedure.  CPT Code: 1 or 2 muscle bodies: 20552     Medications Ordered in ED Medications  lidocaine (LIDODERM) 5 % 1 patch (1 patch Transdermal Patch Applied 04/02/22 1052)  HYDROcodone-acetaminophen (NORCO/VICODIN) 5-325 MG per tablet 1 tablet (1 tablet Oral Given 04/02/22 1052)  iohexol (OMNIPAQUE) 350 MG/ML injection 75 mL (75 mLs Intravenous Contrast Given 04/02/22 1217)  lidocaine-EPINEPHrine (XYLOCAINE W/EPI) 2 %-1:200000 (PF) injection 10 mL (10 mLs Infiltration Given by Other 04/02/22 1425)  bupivacaine(PF) (MARCAINE) 0.5 % injection 10 mL (10 mLs Infiltration Given by Other 04/02/22 1426)    ED Course/ Medical Decision Making/ A&P                           Medical Decision Making Amount and/or Complexity of Data Reviewed Labs: ordered. Radiology: ordered.  Risk Prescription drug management.  76y.o. female presents to the ED with complaints of department left upper back pain issues, this involves an extensive number of treatment options, and is a complaint that carries with it a high risk of complications and morbidity.  The differential diagnosis includes musculoskeletal pain, muscle spasm, cervical radiculopathy, PE, ACS  On arrival pt is nontoxic, vitals significant for tachycardia on arrival, could be due to pain, but also raises  concern for PE with persistent and worsening thoracic back pain. Exam significant for tenderness over the left upper back  Additional history obtained from spouse at bedside. Previous records obtained and reviewed including recent PCP notes  I ordered hydrocodone and Lidoderm patch for pain management  Lab Tests:  I Ordered, reviewed, and interpreted labs, which included: Leukocytosis of 17, but patient currently on steroids, hemoglobin slightly elevated, BMP significant for very slight hypokalemia, glucose  135, no other significant derangements and normal renal function.  Troponin negative x2.  Imaging Studies ordered:  I ordered imaging studies which included CTA chest, I independently visualized and interpreted imaging which showed no evidence of PE or thoracic aortic aneurysm.  Some hazy groundglass opacities in bibasilar lung fields, patient without cough or shortness of breath, feel this is more likely atelectasis rather than pneumonia.  ED Course:   Patient with 2 weeks of worsening upper thoracic back pain on the left.  On arrival patient is tachycardic, given worsening despite appropriate treatment as an outpatient warrants further work-up for potential cardiac etiology or PE.  Fortunately work-up has been reassuring.  Suspect muscular spasm and given that pain is very focal and pinpoint performed trigger point injection and patient had improvement in her pain.  We will have her continue her outpatient medications and follow-up with orthopedics as planned if pain continues.  At this time there does not appear to be any evidence of an acute emergency medical condition requiring further emergent evaluation and the patient appears stable for discharge with appropriate outpatient follow up. Diagnosis and return precautions discussed with patient who verbalizes understanding and is agreeable to discharge.     Portions of this note were generated with Lobbyist. Dictation  errors may occur despite best attempts at proofreading.         Final Clinical Impression(s) / ED Diagnoses Final diagnoses:  Upper back pain on left side    Rx / DC Orders ED Discharge Orders     None         Jacqlyn Larsen, [place] 04/02/22 1519    Wyvonnia Dusky, MD 04/02/22 1524

## 2022-04-05 DIAGNOSIS — M542 Cervicalgia: Secondary | ICD-10-CM | POA: Diagnosis not present

## 2022-04-05 DIAGNOSIS — M5030 Other cervical disc degeneration, unspecified cervical region: Secondary | ICD-10-CM | POA: Diagnosis not present

## 2022-04-07 DIAGNOSIS — M5412 Radiculopathy, cervical region: Secondary | ICD-10-CM | POA: Diagnosis not present

## 2022-04-11 DIAGNOSIS — M5412 Radiculopathy, cervical region: Secondary | ICD-10-CM | POA: Diagnosis not present

## 2022-04-13 ENCOUNTER — Telehealth: Payer: Self-pay | Admitting: *Deleted

## 2022-04-13 DIAGNOSIS — M5412 Radiculopathy, cervical region: Secondary | ICD-10-CM | POA: Diagnosis not present

## 2022-04-13 NOTE — Telephone Encounter (Signed)
       Patient  visited [facility] on 04/02/2022  for back pain   Have you been able to follow up with your primary care physician?  yes  The patient was able to obtain any needed medicine or equipment.  Are there diet recommendations that you are having difficulty following?NA  Patient expresses understanding of discharge instructions and education provided has no other needs at this time.   [place] [phone] [address] , [place] [address] Email : [email or web address]

## 2022-04-18 DIAGNOSIS — M5412 Radiculopathy, cervical region: Secondary | ICD-10-CM | POA: Diagnosis not present

## 2022-04-20 DIAGNOSIS — M5412 Radiculopathy, cervical region: Secondary | ICD-10-CM | POA: Diagnosis not present

## 2022-04-25 DIAGNOSIS — M5412 Radiculopathy, cervical region: Secondary | ICD-10-CM | POA: Diagnosis not present

## 2022-04-26 DIAGNOSIS — M5412 Radiculopathy, cervical region: Secondary | ICD-10-CM | POA: Diagnosis not present

## 2022-04-27 DIAGNOSIS — M5412 Radiculopathy, cervical region: Secondary | ICD-10-CM | POA: Diagnosis not present

## 2022-05-02 DIAGNOSIS — M5412 Radiculopathy, cervical region: Secondary | ICD-10-CM | POA: Diagnosis not present

## 2022-05-04 DIAGNOSIS — M5412 Radiculopathy, cervical region: Secondary | ICD-10-CM | POA: Diagnosis not present

## 2022-05-09 DIAGNOSIS — M5412 Radiculopathy, cervical region: Secondary | ICD-10-CM | POA: Diagnosis not present

## 2022-05-11 DIAGNOSIS — M5412 Radiculopathy, cervical region: Secondary | ICD-10-CM | POA: Diagnosis not present

## 2022-05-15 DIAGNOSIS — Z23 Encounter for immunization: Secondary | ICD-10-CM | POA: Diagnosis not present

## 2022-05-16 DIAGNOSIS — M5412 Radiculopathy, cervical region: Secondary | ICD-10-CM | POA: Diagnosis not present

## 2022-05-18 ENCOUNTER — Other Ambulatory Visit: Payer: Self-pay | Admitting: Neurosurgery

## 2022-05-18 DIAGNOSIS — I671 Cerebral aneurysm, nonruptured: Secondary | ICD-10-CM

## 2022-06-09 ENCOUNTER — Inpatient Hospital Stay (HOSPITAL_COMMUNITY): Admission: RE | Admit: 2022-06-09 | Payer: Medicare Other | Source: Ambulatory Visit

## 2022-06-23 ENCOUNTER — Ambulatory Visit (HOSPITAL_COMMUNITY)
Admission: RE | Admit: 2022-06-23 | Discharge: 2022-06-23 | Disposition: A | Discharge status: Home / Self Care | Payer: Medicare Other | Source: Ambulatory Visit | Attending: Neurosurgery | Admitting: Neurosurgery

## 2022-06-23 ENCOUNTER — Other Ambulatory Visit: Payer: Self-pay

## 2022-06-23 ENCOUNTER — Other Ambulatory Visit: Payer: Self-pay | Admitting: Neurosurgery

## 2022-06-23 DIAGNOSIS — I671 Cerebral aneurysm, nonruptured: Secondary | ICD-10-CM | POA: Diagnosis not present

## 2022-06-23 DIAGNOSIS — Z87891 Personal history of nicotine dependence: Secondary | ICD-10-CM | POA: Diagnosis not present

## 2022-06-23 HISTORY — PX: IR ANGIO INTRA EXTRACRAN SEL INTERNAL CAROTID UNI L MOD SED: IMG5361

## 2022-06-23 HISTORY — PX: IR ANGIO VERTEBRAL SEL VERTEBRAL BILAT MOD SED: IMG5369

## 2022-06-23 HISTORY — PX: IR US GUIDE VASC ACCESS RIGHT: IMG2390

## 2022-06-23 LAB — URINALYSIS, ROUTINE W REFLEX MICROSCOPIC
Bilirubin Urine: NEGATIVE
Glucose, UA: NEGATIVE mg/dL
Hgb urine dipstick: NEGATIVE
Ketones, ur: NEGATIVE mg/dL
Nitrite: POSITIVE — AB
Protein, ur: NEGATIVE mg/dL
Specific Gravity, Urine: 1.012 (ref 1.005–1.030)
WBC, UA: >50 WBC/hpf — ABNORMAL HIGH (ref 0–5)
pH: 6 (ref 5.0–8.0)

## 2022-06-23 LAB — BASIC METABOLIC PANEL
Anion gap: 12 (ref 5–15)
BUN: 14 mg/dL (ref 8–23)
CO2: 25 mmol/L (ref 22–32)
Calcium: 9.3 mg/dL (ref 8.9–10.3)
Chloride: 100 mmol/L (ref 98–111)
Creatinine, Ser: 0.67 mg/dL (ref 0.44–1.00)
GFR, Estimated: >60 mL/min (ref >60)
Glucose, Bld: 110 mg/dL — ABNORMAL HIGH (ref 70–99)
Potassium: 3.7 mmol/L (ref 3.5–5.1)
Sodium: 137 mmol/L (ref 135–145)

## 2022-06-23 LAB — CBC WITH DIFFERENTIAL/PLATELET
Abs Immature Granulocytes: 0.02 10*3/uL (ref 0.00–0.07)
Basophils Absolute: 0.1 10*3/uL (ref 0.0–0.1)
Basophils Relative: 1 %
Eosinophils Absolute: 0.5 10*3/uL (ref 0.0–0.5)
Eosinophils Relative: 6 %
HCT: 39.9 % (ref 36.0–46.0)
Hemoglobin: 13.7 g/dL (ref 12.0–15.0)
Immature Granulocytes: 0 %
Lymphocytes Relative: 17 %
Lymphs Abs: 1.5 10*3/uL (ref 0.7–4.0)
MCH: 31.3 pg (ref 26.0–34.0)
MCHC: 34.3 g/dL (ref 30.0–36.0)
MCV: 91.1 fL (ref 80.0–100.0)
Monocytes Absolute: 0.6 10*3/uL (ref 0.1–1.0)
Monocytes Relative: 7 %
Neutro Abs: 6 10*3/uL (ref 1.7–7.7)
Neutrophils Relative %: 69 %
Platelets: 255 10*3/uL (ref 150–400)
RBC: 4.38 MIL/uL (ref 3.87–5.11)
RDW: 13.1 % (ref 11.5–15.5)
WBC: 8.7 10*3/uL (ref 4.0–10.5)
nRBC: 0 % (ref 0.0–0.2)

## 2022-06-23 LAB — PROTIME-INR
INR: 1.1 (ref 0.8–1.2)
Prothrombin Time: 13.7 seconds (ref 11.4–15.2)

## 2022-06-23 LAB — APTT: aPTT: 31 seconds (ref 24–36)

## 2022-06-23 MED ORDER — MIDAZOLAM HCL 2 MG/2ML IJ SOLN
INTRAMUSCULAR | Status: AC
  Filled 2022-06-23: qty 2

## 2022-06-23 MED ORDER — MIDAZOLAM HCL 2 MG/2ML IJ SOLN
INTRAMUSCULAR | Status: AC | PRN
  Administered 2022-06-23: 1 mg via INTRAVENOUS

## 2022-06-23 MED ORDER — FENTANYL CITRATE (PF) 100 MCG/2ML IJ SOLN
INTRAMUSCULAR | Status: AC
  Filled 2022-06-23: qty 2

## 2022-06-23 MED ORDER — IOHEXOL 300 MG/ML  SOLN
100.0000 mL | Freq: Once | INTRAMUSCULAR | Status: AC | PRN
  Administered 2022-06-23: 40 mL via INTRA_ARTERIAL

## 2022-06-23 MED ORDER — HEPARIN SODIUM (PORCINE) 1000 UNIT/ML IJ SOLN
INTRAMUSCULAR | Status: AC
  Filled 2022-06-23: qty 10

## 2022-06-23 MED ORDER — CHLORHEXIDINE GLUCONATE CLOTH 2 % EX PADS: 6.0000 | MEDICATED_PAD | Freq: Once | CUTANEOUS | Status: DC

## 2022-06-23 MED ORDER — HYDROCODONE-ACETAMINOPHEN 5-325 MG PO TABS: 1.0000 | ORAL_TABLET | ORAL | Status: DC | PRN

## 2022-06-23 MED ORDER — LIDOCAINE HCL 1 % IJ SOLN
INTRAMUSCULAR | Status: AC
  Filled 2022-06-23: qty 20

## 2022-06-23 MED ORDER — VERAPAMIL HCL 2.5 MG/ML IV SOLN
INTRAVENOUS | Status: AC
  Filled 2022-06-23: qty 2

## 2022-06-23 MED ORDER — FENTANYL CITRATE (PF) 100 MCG/2ML IJ SOLN
INTRAMUSCULAR | Status: AC | PRN
  Administered 2022-06-23: 25 ug via INTRAVENOUS

## 2022-06-23 MED ORDER — HEPARIN SODIUM (PORCINE) 1000 UNIT/ML IJ SOLN
INTRAMUSCULAR | Status: AC | PRN
  Administered 2022-06-23: 3000 [IU] via INTRAVENOUS

## 2022-06-23 MED ORDER — CEFAZOLIN SODIUM-DEXTROSE 2-4 GM/100ML-% IV SOLN: 2.0000 g | INTRAVENOUS | Status: DC

## 2022-06-23 MED ORDER — SODIUM CHLORIDE 0.9 % IV SOLN: INTRAVENOUS | Status: DC

## 2022-06-23 MED ORDER — NITROGLYCERIN 1 MG/10 ML FOR IR/CATH LAB
INTRA_ARTERIAL | Status: AC
  Filled 2022-06-23: qty 10

## 2022-06-23 NOTE — Progress Notes (Signed)
Patient was given discharge instructions. Both verbalized understanding. 

## 2022-06-23 NOTE — Discharge Instructions (Signed)

## 2022-06-23 NOTE — Sedation Documentation (Signed)
Attempted to call SBAR to SS. Will call back.

## 2022-06-23 NOTE — Brief Op Note (Signed)
  NEUROSURGERY BRIEF OPERATIVE  NOTE   PREOP DX: Aneurysm  POSTOP DX: Same  PROCEDURE: Diagnostic cerebral angiogram  SURGEON: Dr. Consuella Lose, MD  ANESTHESIA: IV Sedation with Local  APPROACH: Right trans-radial  EBL: Minimal  SPECIMENS: None  COMPLICATIONS: None  CONDITION: Stable to recovery  FINDINGS (Full report in [facility]): 1. Persistent occlusion of RMCA aneurysm 1 year after surgical clipping. No residual aneurysm noted.   Consuella Lose, MD [facility]

## 2022-06-23 NOTE — Progress Notes (Signed)
TR BAND REMOVAL  LOCATION:    right radial  DEFLATED PER PROTOCOL:    Yes.    TIME BAND OFF / DRESSING APPLIED:    1445   SITE UPON ARRIVAL:    Level 0  SITE AFTER BAND REMOVAL:    Level 0  CIRCULATION SENSATION AND MOVEMENT:    Within Normal Limits   Yes.    COMMENTS:   No bleeding noted  

## 2022-06-23 NOTE — H&P (Signed)
Chief Complaint   Aneurysm  History of Present Illness  Ann [name] is a 76 y.o. female who previously underwent elective left craniotomy for clipping of an MCA aneurysm 1 year ago.  She has done very well from a clinical standpoint.  She presents today for routine follow-up angiogram.  Past Medical History   Past Medical History:  Diagnosis Date   Bronchitis, chronic ([place])    Hyperlipidemia    Hypertension    Interstitial lung disease ([place]) 2020   pt sees a pulmonologist   Pneumonia    Pulmonary nodule    Sepsis ([place])     Past Surgical History   Past Surgical History:  Procedure Laterality Date   CHOLECYSTECTOMY     CRANIOTOMY Left 06/03/2021   Procedure: Left PTERIONAL CRANIOTOMY FOR CLIPPING OF MCA ANEURYSM;  Surgeon: Consuella Lose, MD;  Location: [facility];  Service: Neurosurgery;  Laterality: Left;   EYE SURGERY Bilateral 2021   cataracts   IR ANGIO INTRA EXTRACRAN SEL INTERNAL CAROTID BILAT MOD SED  04/26/2021   IR ANGIO VERTEBRAL SEL VERTEBRAL BILAT MOD SED  04/26/2021    Social History   Social History   Tobacco Use   Smoking status: Former    Packs/day: 0.10    Years: 12.00    Total pack years: 1.20    Types: Cigarettes    Start date: 1964    Quit date: 1976    Years since quitting: 47.8   Smokeless tobacco: Never   Tobacco comments:    during smoking years only smoked ocassionally  Vaping Use   Vaping Use: Never used  Substance Use Topics   Alcohol use: Yes    Comment: pt states 1 glass of wine once or twice a month   Drug use: Never    Medications   Prior to Admission medications   Medication Sig Start Date End Date Taking? Authorizing Provider  acetaminophen (TYLENOL) 500 MG tablet Take 1,000 mg by mouth every 6 (six) hours as needed for mild pain.    [provider]  ADVAIR [facility] 270-62 MCG/ACT inhaler Inhale 2 puffs into the lungs 2 (two) times daily. 02/13/22   Margaretha Seeds, MD  aspirin EC 81 MG tablet Take 81 mg by  mouth daily. Swallow whole.    [provider]  atorvastatin (LIPITOR) 20 MG tablet Take 20 mg by mouth daily. 12/26/18   [provider]  calcium carbonate (OS-CAL - DOSED IN MG OF ELEMENTAL CALCIUM) 1250 (500 Ca) MG tablet Take 1 tablet by mouth daily with breakfast.    [provider]  cetirizine (ZYRTEC) 10 MG tablet Take 10 mg by mouth daily.    [provider]  Cholecalciferol (VITAMIN D3) 50 MCG (2000 UT) TABS Take 2,000 Units by mouth daily.    [provider]  clobetasol ointment (TEMOVATE) 3.76 % Apply 1 application topically daily. Apply daily to skin to affected area every day for 30 days    [provider]  lisinopril-hydrochlorothiazide (ZESTORETIC) 20-25 MG tablet Take 1 tablet by mouth daily. 12/26/18   [provider]    Allergies  No Known Allergies  Review of Systems  ROS  Neurologic Exam  Awake, alert, oriented Memory and concentration grossly intact Speech fluent, appropriate CN grossly intact Motor exam: Upper Extremities Deltoid Bicep Tricep Grip  Right 5/5 5/5 5/5 5/5  Left 5/5 5/5 5/5 5/5   Lower Extremities IP Quad PF DF EHL  Right 5/5 5/5 5/5 5/5 5/5  Left 5/5 5/5 5/5 5/5 5/5   Sensation grossly intact to LT  Impression  - 76 y.o. female 1 year status post elective clipping of a wide neck left middle cerebral artery aneurysm, doing well  Plan  -We will plan on proceeding with routine follow-up diagnostic cerebral angiogram  I have reviewed the indications for the angiogram procedure as well as the details of the procedure and the expected postoperative course and recovery. We have also reviewed in detail the risks, benefits, and alternatives to the procedure. All questions were answered and Ann [name] provided informed consent to proceed.  Consuella Lose, MD [facility]

## 2022-06-27 ENCOUNTER — Other Ambulatory Visit: Payer: Self-pay | Admitting: Neurosurgery

## 2022-06-27 ENCOUNTER — Encounter (HOSPITAL_COMMUNITY): Payer: Self-pay

## 2022-06-27 DIAGNOSIS — I671 Cerebral aneurysm, nonruptured: Secondary | ICD-10-CM

## 2022-07-03 DIAGNOSIS — I671 Cerebral aneurysm, nonruptured: Secondary | ICD-10-CM | POA: Diagnosis not present

## 2022-07-06 DIAGNOSIS — R7303 Prediabetes: Secondary | ICD-10-CM | POA: Diagnosis not present

## 2022-07-06 DIAGNOSIS — E7800 Pure hypercholesterolemia, unspecified: Secondary | ICD-10-CM | POA: Diagnosis not present

## 2022-07-06 DIAGNOSIS — N952 Postmenopausal atrophic vaginitis: Secondary | ICD-10-CM | POA: Diagnosis not present

## 2022-07-06 DIAGNOSIS — J849 Interstitial pulmonary disease, unspecified: Secondary | ICD-10-CM | POA: Diagnosis not present

## 2022-07-06 DIAGNOSIS — I10 Essential (primary) hypertension: Secondary | ICD-10-CM | POA: Diagnosis not present

## 2022-07-06 DIAGNOSIS — M8580 Other specified disorders of bone density and structure, unspecified site: Secondary | ICD-10-CM | POA: Diagnosis not present

## 2022-07-06 DIAGNOSIS — I700 Atherosclerosis of aorta: Secondary | ICD-10-CM | POA: Diagnosis not present

## 2022-07-31 DIAGNOSIS — H02052 Trichiasis without entropian right lower eyelid: Secondary | ICD-10-CM | POA: Diagnosis not present

## 2022-07-31 DIAGNOSIS — H18523 Epithelial (juvenile) corneal dystrophy, bilateral: Secondary | ICD-10-CM | POA: Diagnosis not present

## 2022-11-22 DIAGNOSIS — K5730 Diverticulosis of large intestine without perforation or abscess without bleeding: Secondary | ICD-10-CM | POA: Diagnosis not present

## 2022-11-22 DIAGNOSIS — D124 Benign neoplasm of descending colon: Secondary | ICD-10-CM | POA: Diagnosis not present

## 2022-11-22 DIAGNOSIS — Z1211 Encounter for screening for malignant neoplasm of colon: Secondary | ICD-10-CM | POA: Diagnosis not present

## 2023-01-03 ENCOUNTER — Encounter (HOSPITAL_BASED_OUTPATIENT_CLINIC_OR_DEPARTMENT_OTHER): Payer: Self-pay | Admitting: Pulmonary Disease

## 2023-01-09 MED ORDER — PREDNISONE 10 MG PO TABS: ORAL_TABLET | ORAL | 0 refills | Status: AC

## 2023-01-11 DIAGNOSIS — M8588 Other specified disorders of bone density and structure, other site: Secondary | ICD-10-CM | POA: Diagnosis not present

## 2023-01-11 DIAGNOSIS — I700 Atherosclerosis of aorta: Secondary | ICD-10-CM | POA: Diagnosis not present

## 2023-01-11 DIAGNOSIS — N952 Postmenopausal atrophic vaginitis: Secondary | ICD-10-CM | POA: Diagnosis not present

## 2023-01-11 DIAGNOSIS — Z1331 Encounter for screening for depression: Secondary | ICD-10-CM | POA: Diagnosis not present

## 2023-01-11 DIAGNOSIS — E7800 Pure hypercholesterolemia, unspecified: Secondary | ICD-10-CM | POA: Diagnosis not present

## 2023-01-11 DIAGNOSIS — I10 Essential (primary) hypertension: Secondary | ICD-10-CM | POA: Diagnosis not present

## 2023-01-11 DIAGNOSIS — Z0000 Encounter for general adult medical examination without abnormal findings: Secondary | ICD-10-CM | POA: Diagnosis not present

## 2023-01-11 DIAGNOSIS — J849 Interstitial pulmonary disease, unspecified: Secondary | ICD-10-CM | POA: Diagnosis not present

## 2023-01-11 DIAGNOSIS — R7303 Prediabetes: Secondary | ICD-10-CM | POA: Diagnosis not present

## 2023-02-08 DIAGNOSIS — Z1231 Encounter for screening mammogram for malignant neoplasm of breast: Secondary | ICD-10-CM | POA: Diagnosis not present

## 2023-02-12 ENCOUNTER — Ambulatory Visit (INDEPENDENT_AMBULATORY_CARE_PROVIDER_SITE_OTHER): Payer: Medicare Other | Admitting: Pulmonary Disease

## 2023-02-12 ENCOUNTER — Encounter (HOSPITAL_BASED_OUTPATIENT_CLINIC_OR_DEPARTMENT_OTHER): Payer: Medicare Other

## 2023-02-12 ENCOUNTER — Encounter (HOSPITAL_BASED_OUTPATIENT_CLINIC_OR_DEPARTMENT_OTHER): Payer: Self-pay | Admitting: Pulmonary Disease

## 2023-02-12 VITALS — BP 124/74 | HR 93 | Temp 99.0°F | Ht 64.0 in | Wt 162.4 lb

## 2023-02-12 DIAGNOSIS — J849 Interstitial pulmonary disease, unspecified: Secondary | ICD-10-CM

## 2023-02-12 LAB — PULMONARY FUNCTION TEST
DL/VA % pred: 84 %
DL/VA: 3.43 ml/min/mmHg/L
DLCO cor % pred: 62 %
DLCO cor: 11.91 ml/min/mmHg
DLCO unc % pred: 62 %
DLCO unc: 11.91 ml/min/mmHg
FEF 25-75 Post: 1.88 L/sec
FEF 25-75 Pre: 2.39 L/sec
FEF2575-%Change-Post: -21 %
FEF2575-%Pred-Post: 119 %
FEF2575-%Pred-Pre: 151 %
FEV1-%Change-Post: -2 %
FEV1-%Pred-Post: 91 %
FEV1-%Pred-Pre: 93 %
FEV1-Post: 1.89 L
FEV1-Pre: 1.94 L
FEV1FVC-%Change-Post: 3 %
FEV1FVC-%Pred-Pre: 109 %
FEV6-%Change-Post: -6 %
FEV6-%Pred-Post: 84 %
FEV6-%Pred-Pre: 90 %
FEV6-Post: 2.22 L
FEV6-Pre: 2.37 L
FEV6FVC-%Pred-Post: 105 %
FEV6FVC-%Pred-Pre: 105 %
FVC-%Change-Post: -6 %
FVC-%Pred-Post: 80 %
FVC-%Pred-Pre: 85 %
FVC-Post: 2.22 L
FVC-Pre: 2.37 L
Post FEV1/FVC ratio: 85 %
Post FEV6/FVC ratio: 100 %
Pre FEV1/FVC ratio: 82 %
Pre FEV6/FVC Ratio: 100 %
RV % pred: 62 %
RV: 1.47 L
TLC % pred: 73 %
TLC: 3.74 L

## 2023-02-12 MED ORDER — ADVAIR HFA 115-21 MCG/ACT IN AERO: 2.0000 | INHALATION_SPRAY | Freq: Two times a day (BID) | RESPIRATORY_TRACT | 11 refills | Status: DC

## 2023-02-12 NOTE — Patient Instructions (Signed)
Full PFT performed today. °

## 2023-02-12 NOTE — Progress Notes (Signed)
Full PFT performed today. °

## 2023-02-12 NOTE — Patient Instructions (Signed)
Mild ILD Mildly reduced DLCO --Reviewed PFTs. Unchanged DLCO --Ambulatory O2 with no desaturations however SpO2 89% at the lowest --ORDER overnight oximetry on room air. Please contact our office once the test is completed so we can obtain your results  Chronic Bronchitis --CONTINUE Advair 115-21 mcg TWO puffs TWICE a day. REFILLED --STOP Albuterol. No longer needed  When you are sick Increase to Advair 2 puffs three times a day for worsening shortness of breath, wheezing and cough. If you symptoms do not improve in 24-48 hours, please our office for evaluation and/or prednisone taper.

## 2023-02-12 NOTE — Progress Notes (Signed)
Subjective:   PATIENT ID: Ann [name] GENDER: female DOB: 1946/05/18, MRN: [id number]   HPI  Chief Complaint  Patient presents with   Follow-up    Follow up on PFT. Patient has no complaints.     Reason for Visit: Follow-up  [name] is a 77 year old female former smoker with ILD who presents for follow-up.  Synopsis: Initially referred for chronic cough and diagnosed with mild ILD. Autoimmune work-up revealed +ANA 1:160 with myositis panel, sjogren's antibodies and scleroderma screen returning negative. Rheumatology with no further work-up indicated.  03/21/21 Since our last visit six months ago, she reports her respiratory symptoms are overall stable on Advair. She was previously on [facility] but switched to available inhaler without any worsening in symptoms. Over a week ago she did develop a recent cold (COVID-19 neg) that has improved. No shortness of breath or wheezing but has a residual cough with greenish sputum. Otherwise no complaints. Denies exacerbations or ED visits requiring steroids or antibiotics for respiratory infections.  09/21/21 Since our last visit she reports viral illness for ~10 days. She had nonproductive cough, no wheezing. She had recent sick contact but did not take COVID-19. Symptoms have resolved. She had a hospitalization in end Sept for craniotomy for incidental L MCA aneurysm. Has been compliant with Advair. Never uses albuterol. No other respiratory illnesses aside from this viral illness  02/13/22 She has been compliant with reduced strength Advair and reports well-controlled symptoms. Denies coughing, wheezing or shortness of breath. Has not needed rescue inhaler at all. She walks every other day. No limitations in activity  02/12/23 Since our last visit overall doing well in the last year except for last month in May 2024 requiring bronchitis requiring prednisone. No further issues since then. Denies shortness of breath, cough or  wheezing. She remains compliant with her Advair twice a day. No limitations in activity. Walking 2-3 times a week for one hour.   Social History: Former smoker. Quit 1976.  Past Medical History:  Diagnosis Date   Bronchitis, chronic (HCC)    Hyperlipidemia    Hypertension    Interstitial lung disease (HCC) 2020   pt sees a pulmonologist   Pneumonia    Pulmonary nodule    Sepsis (HCC)      Outpatient Medications Prior to Visit  Medication Sig Dispense Refill   acetaminophen (TYLENOL) 500 MG tablet Take 1,000 mg by mouth every 6 (six) hours as needed for mild pain.     atorvastatin (LIPITOR) 20 MG tablet Take 20 mg by mouth daily.     calcium carbonate (OS-CAL - DOSED IN MG OF ELEMENTAL CALCIUM) 1250 (500 Ca) MG tablet Take 1 tablet by mouth daily with breakfast.     cetirizine (ZYRTEC) 10 MG tablet Take 10 mg by mouth daily.     Cholecalciferol (VITAMIN D3) 50 MCG (2000 UT) TABS Take 2,000 Units by mouth daily.     clobetasol ointment (TEMOVATE) 0.05 % Apply 1 application topically daily. Apply daily to skin to affected area every day for 30 days     lisinopril-hydrochlorothiazide (ZESTORETIC) 20-25 MG tablet Take 1 tablet by mouth daily.     Multiple Vitamins-Minerals (PRESERVISION AREDS 2+MULTI VIT) CAPS      ADVAIR HFA 115-21 MCG/ACT inhaler Inhale 2 puffs into the lungs 2 (two) times daily. 12 g 11   aspirin EC 81 MG tablet Take 81 mg by mouth daily. Swallow whole.     No facility-administered medications prior to  visit.    Review of Systems  Constitutional:  Negative for chills, diaphoresis, fever, malaise/fatigue and weight loss.  HENT:  Negative for congestion.   Respiratory:  Negative for cough, hemoptysis, sputum production, shortness of breath and wheezing.   Cardiovascular:  Negative for chest pain, palpitations and leg swelling.    Objective:   Vitals:   02/12/23 1508  BP: 124/74  Pulse: 93  Temp: 99 F (37.2 C)  TempSrc: Oral  SpO2: 93%  Weight: 162 lb  6.4 oz (73.7 kg)  Height: 5\' 4"  (1.626 m)   SpO2: 93 % O2 Device: None (Room air)  Physical Exam: General: Well-appearing, no acute distress HENT: [place], AT Eyes: EOMI, no scleral icterus Respiratory: Clear to auscultation bilaterally.  No crackles, wheezing or rales Cardiovascular: RRR, -M/R/G, no JVD Extremities:-Edema,-tenderness Neuro: AAO x4, CNII-XII grossly intact Psych: Normal mood, normal affect  Data Reviewed:  Imaging: CTA 03/26/19 - No PE, borderline mediastinal (9mm) and right hilar (11mm) lymph nodes, stable compared to 02/16/19 CT Chest 09/01/19 - Minimal bibasilar interstitial thickening/scarring at the bases with minimal bronchiectasis R>L.  Mild bilateral GGO with improvement in LUL nodule/GGO HRCT Chest 02/13/20 - Mild subpleural GGO with mild traction bronchiectasis, basilar predominance. No honeycombing. Stable lung nodules CTA 04/02/22 - Dependent atelectasis along the lung bases and posterior regions  PFT: 09/22/19 FVC 2.46 (85%) FEV1 1.95 (89%) Ratio 76  TLC 76% DLCO 74% Interpretation: Mild restrictive defect with mildly reduced uncorrected DLCO  09/06/20 FVC 2.32 (81%) FEV1 1.88 (88%) Ratio 77  TLC 98% DLCO 70% Interpretation: Mild restrictive defect with mildly reduced DLCO (corrected)  02/13/22 FVC 2.22 (86%) FEV1 1.98 (94%) Ratio 82  TLC 93% DLCO 60% Interpretation: Mild reduced DLCO  02/12/23 FVC 2.22 (80%) FEV1 1.89 (91%) Ratio 82  TLC 79% DLCO 62% Interpretation: Mild restrictive defect with mildly reduced DLCO. No significant change in DLCO  Labs: 03/26/19 Abs eos 1500 04/26/21 Abs eso 400   Assessment & Plan:  Discussion: 77 year old female with mild ILD of unknown etiology (ANA+, no further work-up per Rheum). 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 Reviewed PFTs. Stable with mild lung disease. Nadir SpO2 89% during ambulatory walk. Counseled on clinical course of ILD and management including oxygen, aerobic exercise, upper body weights and minimizing  respiratory illness.  Mild ILD Mildly reduced DLCO --Reviewed PFTs. Unchanged DLCO --Ambulatory O2 with no desaturations however SpO2 89% at the lowest --ORDER overnight oximetry on room air. Please contact our office once the test is completed so we can obtain your results  Chronic Bronchitis --CONTINUE Advair 115-21 mcg TWO puffs TWICE a day. REFILLED --STOP Albuterol. No longer needed  When you are sick Increase to Advair 2 puffs three times a day for worsening shortness of breath, wheezing and cough. If you symptoms do not improve in 24-48 hours, please our office for evaluation and/or prednisone taper.  Health Maintenance Immunization History  Administered Date(s) Administered   Influenza Split 05/28/2019, 05/27/2020, 05/11/2021   Influenza, High Dose Seasonal PF 06/18/2018, 05/28/2019   Influenza-Unspecified 09/05/2015, 07/05/2017   PFIZER(Purple Top)SARS-COV-2 Vaccination 10/11/2019, 11/01/2019, 06/19/2020, 04/13/2021   Pneumococcal Conjugate-13 07/04/2016, 07/17/2016   Pneumococcal Polysaccharide-23 12/09/2019   Tdap 02/11/2020   Zoster, Live 02/11/2020, 05/27/2020   Orders Placed This Encounter  Procedures   Pulse oximetry, overnight    On room air    Standing Status:   Future    Standing Expiration Date:   02/12/2024   Meds ordered this encounter  Medications   ADVAIR HFA  115-21 MCG/ACT inhaler    Sig: Inhale 2 puffs into the lungs 2 (two) times daily.    Dispense:  12 g    Refill:  11   Return in about 6 months (around 08/14/2023).  I have spent a total time of 35-minutes on the day of the appointment including chart review, data review, collecting history, coordinating care and discussing medical diagnosis and plan with the patient/family. Past medical history, allergies, medications were reviewed. Pertinent imaging, labs and tests included in this note have been reviewed and interpreted independently by me.  Shawnie Nicole Mechele Collin, MD [facility] 02/12/2023 3:32 PM  Office Number [phone]

## 2023-02-27 DIAGNOSIS — R0902 Hypoxemia: Secondary | ICD-10-CM | POA: Diagnosis not present

## 2023-02-27 DIAGNOSIS — G4730 Sleep apnea, unspecified: Secondary | ICD-10-CM | POA: Diagnosis not present

## 2023-03-11 ENCOUNTER — Telehealth (HOSPITAL_BASED_OUTPATIENT_CLINIC_OR_DEPARTMENT_OTHER): Payer: Self-pay | Admitting: Pulmonary Disease

## 2023-03-11 DIAGNOSIS — G4734 Idiopathic sleep related nonobstructive alveolar hypoventilation: Secondary | ICD-10-CM

## 2023-03-11 NOTE — Telephone Encounter (Signed)
Overnight Oximetry Results Date: 02/27/23 SpO2 <88% 0 hour 34 min 58 sec.  [place] SpO2 84%  Qualified for oxygen  Assessment/Plan Nocturnal Hypoxemia --START 2L O2 via [place] nightly

## 2023-03-12 NOTE — Telephone Encounter (Signed)
Called and spoke with patient. Went over results and asked patient was she okay with me placing the order for O2. She agreed. Order has been placed.   Nothing further needed.

## 2023-05-08 ENCOUNTER — Encounter (HOSPITAL_BASED_OUTPATIENT_CLINIC_OR_DEPARTMENT_OTHER): Payer: Self-pay | Admitting: Pulmonary Disease

## 2023-05-08 NOTE — Telephone Encounter (Signed)
Patient is going out of town Thursday and wants to know if she needs to take it or go without.

## 2023-05-09 DIAGNOSIS — Z23 Encounter for immunization: Secondary | ICD-10-CM | POA: Diagnosis not present

## 2023-05-16 ENCOUNTER — Other Ambulatory Visit (HOSPITAL_COMMUNITY): Payer: Self-pay

## 2023-05-16 DIAGNOSIS — Z23 Encounter for immunization: Secondary | ICD-10-CM | POA: Diagnosis not present

## 2023-05-23 ENCOUNTER — Telehealth: Payer: Self-pay | Admitting: Pulmonary Disease

## 2023-05-23 NOTE — Telephone Encounter (Signed)
Patient needs a refill on Advair. It seems that she needs a new prescription.   [facility] [phone] [phone]

## 2023-05-24 MED ORDER — ADVAIR HFA 115-21 MCG/ACT IN AERO: 2.0000 | INHALATION_SPRAY | Freq: Two times a day (BID) | RESPIRATORY_TRACT | 11 refills | Status: DC

## 2023-05-24 NOTE — Telephone Encounter (Signed)
Advair refill sent in to preferred pharmacy

## 2023-07-17 DIAGNOSIS — E7800 Pure hypercholesterolemia, unspecified: Secondary | ICD-10-CM | POA: Diagnosis not present

## 2023-07-17 DIAGNOSIS — M8580 Other specified disorders of bone density and structure, unspecified site: Secondary | ICD-10-CM | POA: Diagnosis not present

## 2023-07-17 DIAGNOSIS — I700 Atherosclerosis of aorta: Secondary | ICD-10-CM | POA: Diagnosis not present

## 2023-07-17 DIAGNOSIS — R7303 Prediabetes: Secondary | ICD-10-CM | POA: Diagnosis not present

## 2023-07-17 DIAGNOSIS — I10 Essential (primary) hypertension: Secondary | ICD-10-CM | POA: Diagnosis not present

## 2023-07-17 DIAGNOSIS — J849 Interstitial pulmonary disease, unspecified: Secondary | ICD-10-CM | POA: Diagnosis not present

## 2023-08-06 DIAGNOSIS — H18523 Epithelial (juvenile) corneal dystrophy, bilateral: Secondary | ICD-10-CM | POA: Diagnosis not present

## 2023-08-06 DIAGNOSIS — H353131 Nonexudative age-related macular degeneration, bilateral, early dry stage: Secondary | ICD-10-CM | POA: Diagnosis not present

## 2023-09-14 ENCOUNTER — Encounter (HOSPITAL_BASED_OUTPATIENT_CLINIC_OR_DEPARTMENT_OTHER): Payer: Self-pay | Admitting: Pulmonary Disease

## 2023-09-14 ENCOUNTER — Ambulatory Visit (HOSPITAL_BASED_OUTPATIENT_CLINIC_OR_DEPARTMENT_OTHER): Payer: Medicare Other | Admitting: Pulmonary Disease

## 2023-09-14 VITALS — BP 124/82 | HR 89 | Resp 16 | Ht 64.0 in | Wt 163.3 lb

## 2023-09-14 DIAGNOSIS — J849 Interstitial pulmonary disease, unspecified: Secondary | ICD-10-CM

## 2023-09-14 DIAGNOSIS — G4734 Idiopathic sleep related nonobstructive alveolar hypoventilation: Secondary | ICD-10-CM | POA: Diagnosis not present

## 2023-09-14 NOTE — Progress Notes (Signed)
 Subjective:   PATIENT ID: Ann [name] GENDER: female DOB: [birth year], MRN: [id number]   HPI  Chief Complaint  Patient presents with   Follow-up    ILD    Reason for Visit: Follow-up  [name] is a 78 year old female former smoker with ILD who presents for follow-up.  Synopsis: Initially referred for chronic cough and diagnosed with mild ILD. Autoimmune work-up revealed +ANA 1:160 with myositis panel, sjogren's antibodies and scleroderma screen returning negative. Rheumatology with no further work-up indicated.  03/21/21 Since our last visit six months ago, she reports her respiratory symptoms are overall stable on Advair . She was previously on Dulera  but switched to available inhaler without any worsening in symptoms. Over a week ago she did develop a recent cold (COVID-19 neg) that has improved. No shortness of breath or wheezing but has a residual cough with greenish sputum. Otherwise no complaints. Denies exacerbations or ED visits requiring steroids or antibiotics for respiratory infections.  09/21/21 Since our last visit she reports viral illness for ~10 days. She had nonproductive cough, no wheezing. She had recent sick contact but did not take COVID-19. Symptoms have resolved. She had a hospitalization in end Sept for craniotomy for incidental L MCA aneurysm. Has been compliant with Advair . Never uses albuterol . No other respiratory illnesses aside from this viral illness  02/13/22 She has been compliant with reduced strength Advair  and reports well-controlled symptoms. Denies coughing, wheezing or shortness of breath. Has not needed rescue inhaler at all. She walks every other day. No limitations in activity  02/12/23 Since our last visit overall doing well in the last year except for last month in May 2024 requiring bronchitis requiring prednisone . No further issues since then. Denies shortness of breath, cough or wheezing. She remains compliant with her  Advair  twice a day. No limitations in activity. Walking 2-3 times a week for one hour.   09/14/23 Since our last visit she was started on nocturnal oxygen . On Advair  twice a day. Feels both oxygen  and inhalers help with her breathing. No rescue inhalers. Denies shortness of breath, wheezing or coughing. Before the holidays she was walking daily.   Social History: Former smoker. Quit 1976.  Past Medical History:  Diagnosis Date   Bronchitis, chronic (HCC)    Hyperlipidemia    Hypertension    Interstitial lung disease (HCC) 2020   pt sees a pulmonologist   Pneumonia    Pulmonary nodule    Sepsis (HCC)      Outpatient Medications Prior to Visit  Medication Sig Dispense Refill   acetaminophen  (TYLENOL ) 500 MG tablet Take 1,000 mg by mouth every 6 (six) hours as needed for mild pain.     ADVAIR  HFA 115-21 MCG/ACT inhaler Inhale 2 puffs into the lungs 2 (two) times daily. 12 g 11   atorvastatin  (LIPITOR) 20 MG tablet Take 20 mg by mouth daily.     calcium  carbonate (OS-CAL - DOSED IN MG OF ELEMENTAL CALCIUM ) 1250 (500 Ca) MG tablet Take 1 tablet by mouth daily with breakfast.     cetirizine (ZYRTEC) 10 MG tablet Take 10 mg by mouth daily.     Cholecalciferol  (VITAMIN D3) 50 MCG (2000 UT) TABS Take 2,000 Units by mouth daily.     clobetasol ointment (TEMOVATE) 0.05 % Apply 1 application topically daily. Apply daily to skin to affected area every day for 30 days     lisinopril -hydrochlorothiazide  (ZESTORETIC ) 20-25 MG tablet Take 1 tablet by mouth daily.  Multiple Vitamins-Minerals (PRESERVISION AREDS 2+MULTI VIT) CAPS      No facility-administered medications prior to visit.    Review of Systems  Constitutional:  Negative for chills, diaphoresis, fever, malaise/fatigue and weight loss.  HENT:  Negative for congestion.   Respiratory:  Negative for cough, hemoptysis, sputum production, shortness of breath and wheezing.   Cardiovascular:  Negative for chest pain, palpitations and leg  swelling.    Objective:   Vitals:   09/14/23 0901  BP: 124/82  Pulse: 89  Resp: 16  SpO2: 94%  Weight: 163 lb 4.8 oz (74.1 kg)  Height: 5' 4 (1.626 m)   SpO2: 94 %  Physical Exam: General: Well-appearing, no acute distress HENT: [place], AT Eyes: EOMI, no scleral icterus Respiratory: Clear to auscultation bilaterally.  No crackles, wheezing or rales Cardiovascular: RRR, -M/R/G, no JVD Extremities:-Edema,-tenderness Neuro: AAO x4, CNII-XII grossly intact Psych: Normal mood, normal affect  Data Reviewed:  Imaging: CTA 03/26/19 - No PE, borderline mediastinal (9mm) and right hilar (11mm) lymph nodes, stable compared to 02/16/19 CT Chest 09/01/19 - Minimal bibasilar interstitial thickening/scarring at the bases with minimal bronchiectasis R>L.  Mild bilateral GGO with improvement in LUL nodule/GGO HRCT Chest 02/13/20 - Mild subpleural GGO with mild traction bronchiectasis, basilar predominance. No honeycombing. Stable lung nodules CTA 04/02/22 - Dependent atelectasis along the lung bases and posterior regions  PFT: 09/22/19 FVC 2.46 (85%) FEV1 1.95 (89%) Ratio 76  TLC 76% DLCO 74% Interpretation: Mild restrictive defect with mildly reduced uncorrected DLCO  09/06/20 FVC 2.32 (81%) FEV1 1.88 (88%) Ratio 77  TLC 98% DLCO 70% Interpretation: Mild restrictive defect with mildly reduced DLCO (corrected)  02/13/22 FVC 2.22 (86%) FEV1 1.98 (94%) Ratio 82  TLC 93% DLCO 60% Interpretation: Mild reduced DLCO  02/12/23 FVC 2.22 (80%) FEV1 1.89 (91%) Ratio 82  TLC 79% DLCO 62% Interpretation: Mild restrictive defect with mildly reduced DLCO. No significant change in DLCO  Labs: 03/26/19 Abs eos 1500 04/26/21 Abs eso 400   Assessment & Plan:  Discussion: 78 year old female with mild ILD of unknown etiology (ANA+, no further work-up per Rheum). 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 PFTs have been stable. Overall she is doing well on her current regimen, compliant with oxygen  and inhalers.  Mild  ILD Mildly reduced DLCO Nocturnal hypoxemia --Reviewed PFTs. Unchanged DLCO --Last ONO 02/27/23.  --Continue to wear 2L nightly --ORDER pulmonary function tests for 05/2024 --UTD on RSV, pneumoccal, influenza  Chronic Bronchitis --CONTINUE Advair  115-21 mcg TWO puffs TWICE a day. REFILLED --STOP Albuterol . No longer needed  When you are sick Increase to Advair  2 puffs three times a day for worsening shortness of breath, wheezing and cough. If you symptoms do not improve in 24-48 hours, please our office for evaluation and/or prednisone  taper.  Health Maintenance Immunization History  Administered Date(s) Administered   Influenza Split 05/28/2019, 05/27/2020, 05/11/2021   Influenza, High Dose Seasonal PF 06/18/2018, 05/28/2019   Influenza-Unspecified 09/05/2015, 07/05/2017   PFIZER(Purple Top)SARS-COV-2 Vaccination 10/11/2019, 11/01/2019, 06/19/2020, 04/13/2021   Pneumococcal Conjugate-13 07/04/2016, 07/17/2016   Pneumococcal Polysaccharide-23 12/09/2019   Tdap 02/11/2020   Zoster, Live 02/11/2020, 05/27/2020   Orders Placed This Encounter  Procedures   Pulmonary function test    Standing Status:   Future    Expiration Date:   09/13/2024    Where should this test be performed?:   [facility]    Full PFT: includes the following: basic spirometry, spirometry pre & post bronchodilator, diffusion capacity (DLCO), lung volumes:   Full PFT  No orders of the defined types were placed in this encounter.  Return in about 8 months (around 05/14/2024) for after PFT.  I have spent a total time of 30-minutes on the day of the appointment including chart review, data review, collecting history, coordinating care and discussing medical diagnosis and plan with the patient/family. Past medical history, allergies, medications were reviewed. Pertinent imaging, labs and tests included in this note have been reviewed and interpreted independently by me.  Georgene Kopper Slater Staff, MD [facility] 09/14/2023 9:25 AM  Office Number [phone]

## 2023-09-14 NOTE — Patient Instructions (Signed)
 Mild ILD Mildly reduced DLCO Nocturnal hypoxemia --Reviewed PFTs. Unchanged DLCO --Last ONO 02/27/23.  --Continue to wear 2L nightly --ORDER pulmonary function tests for 05/2024

## 2023-11-01 ENCOUNTER — Telehealth: Payer: Medicare Other | Admitting: Physician Assistant

## 2023-11-01 DIAGNOSIS — J441 Chronic obstructive pulmonary disease with (acute) exacerbation: Secondary | ICD-10-CM | POA: Diagnosis not present

## 2023-11-01 MED ORDER — BENZONATATE 100 MG PO CAPS
100.0000 mg | ORAL_CAPSULE | Freq: Three times a day (TID) | ORAL | 0 refills | Status: DC | PRN
Start: 2023-11-01 — End: 2023-12-19

## 2023-11-01 MED ORDER — DOXYCYCLINE HYCLATE 100 MG PO TABS
100.0000 mg | ORAL_TABLET | Freq: Two times a day (BID) | ORAL | 0 refills | Status: DC
Start: 2023-11-01 — End: 2023-12-19

## 2023-11-01 MED ORDER — PREDNISONE 20 MG PO TABS
40.0000 mg | ORAL_TABLET | Freq: Every day | ORAL | 0 refills | Status: DC
Start: 2023-11-01 — End: 2023-12-19

## 2023-11-01 NOTE — Progress Notes (Signed)
 I have spent 5 minutes in review of e-visit questionnaire, review and updating patient chart, medical decision making and response to patient.   Piedad Climes, PA-C

## 2023-11-01 NOTE — Progress Notes (Signed)
 E-Visit for Cough   We are sorry that you are not feeling well.  Here is how we plan to help!  Based on your presentation I believe you most likely have a COPD exacerbation and acute bronchitis.  When patients have a productive cough with a change in color or increased sputum production, we are concerned about bacterial bronchitis.  If left untreated it can progress to pneumonia.  If your symptoms do not improve with your treatment plan it is important that you contact your provider.   I have prescribed Doxycycline 100 mg twice a day for 7 days     In addition you may use A prescription cough medication called Tessalon Perles 100mg . You may take 1-2 capsules every 8 hours as needed for your cough. I have also sent in a burst of prednisone to take as directed.  From your responses in the eVisit questionnaire you describe inflammation in the upper respiratory tract which is causing a significant cough.  This is commonly called Bronchitis and has four common causes:   Allergies Viral Infections Acid Reflux Bacterial Infection Allergies, viruses and acid reflux are treated by controlling symptoms or eliminating the cause. An example might be a cough caused by taking certain blood pressure medications. You stop the cough by changing the medication. Another example might be a cough caused by acid reflux. Controlling the reflux helps control the cough.  USE OF BRONCHODILATOR ("RESCUE") INHALERS: There is a risk from using your bronchodilator too frequently.  The risk is that over-reliance on a medication which only relaxes the muscles surrounding the breathing tubes can reduce the effectiveness of medications prescribed to reduce swelling and congestion of the tubes themselves.  Although you feel brief relief from the bronchodilator inhaler, your asthma may actually be worsening with the tubes becoming more swollen and filled with mucus.  This can delay other crucial treatments, such as oral steroid  medications. If you need to use a bronchodilator inhaler daily, several times per day, you should discuss this with your provider.  There are probably better treatments that could be used to keep your asthma under control.     HOME CARE Only take medications as instructed by your medical team. Complete the entire course of an antibiotic. Drink plenty of fluids and get plenty of rest. Avoid close contacts especially the very young and the elderly Cover your mouth if you cough or cough into your sleeve. Always remember to wash your hands A steam or ultrasonic humidifier can help congestion.   GET HELP RIGHT AWAY IF: You develop worsening fever. You become short of breath You cough up blood. Your symptoms persist after you have completed your treatment plan MAKE SURE YOU  Understand these instructions. Will watch your condition. Will get help right away if you are not doing well or get worse.    Thank you for choosing an e-visit.  Your e-visit answers were reviewed by a board certified advanced clinical practitioner to complete your personal care plan. Depending upon the condition, your plan could have included both over the counter or prescription medications.  Please review your pharmacy choice. Make sure the pharmacy is open so you can pick up prescription now. If there is a problem, you may contact your provider through [employer] and have the prescription routed to another pharmacy.  Your safety is important to [place]. If you have drug allergies check your prescription carefully.   For the next 24 hours you can use MyChart to ask questions  about today's visit, request a non-urgent call back, or ask for a work or school excuse. You will get an email in the next two days asking about your experience. I hope that your e-visit has been valuable and will speed your recovery.

## 2023-12-17 ENCOUNTER — Ambulatory Visit: Payer: Self-pay

## 2023-12-17 NOTE — Telephone Encounter (Signed)
 Copied from CRM [id number]. Topic: Clinical - Red Word Triage >> Dec 17, 2023  9:10 AM Juliana Ocean wrote: Red Word that prompted transfer to Nurse Triage: SOB/pt does not feel she is getting any better  E2C2 Pulmonary Triage - Initial Assessment Questions "Chief Complaint (e.g., cough, sob, wheezing, fever, chills, sweat or additional symptoms) *Go to specific symptom protocol after initial questions.   Cough-Intermittent, Shortness of Breath  "How long have symptoms been present?"  A couple of weeks  Have you tested for COVID or Flu? Note: If not, ask patient if a home test can be taken. If so, instruct patient to call back for positive results. No  MEDICINES:   "Have you used any OTC meds to help with symptoms?" No If yes, ask "What medications?"  None  "Have you used your inhalers/maintenance medication?" Yes If yes, "What medications?" Advair  If inhaler, ask "How many puffs and how often?" Note: Review instructions on medication in the chart. 2 Puffs  OXYGEN: "Do you wear supplemental oxygen?" Yes If yes, "How many liters are you supposed to use?" 2L   "Do you monitor your oxygen levels?" Yes If yes, "What is your reading (oxygen level) today?" Unsure, last reading was 95%  "What is your usual oxygen saturation reading?"  (Note: Pulmonary O2 sats should be 90% or greater) Mid to High 90's  Reason for Disposition  [1] MODERATE longstanding difficulty breathing (e.g., speaks in phrases, SOB even at rest, pulse 100-120) AND [2] SAME as normal  Answer Assessment - Initial Assessment Questions 1. RESPIRATORY STATUS: "Describe your breathing?" (e.g., wheezing, shortness of breath, unable to speak, severe coughing)      Shortness of Breath  2. ONSET: "When did this breathing problem begin?"      A couple of weeks  3. PATTERN "Does the difficult breathing come and go, or has it been constant since it started?"      Intermittent  4. SEVERITY: "How bad is your breathing?"  (e.g., mild, moderate, severe)    - MILD: No SOB at rest, mild SOB with walking, speaks normally in sentences, can lie down, no retractions, pulse < 100.    - MODERATE: SOB at rest, SOB with minimal exertion and prefers to sit, cannot lie down flat, speaks in phrases, mild retractions, audible wheezing, pulse 100-120.    - SEVERE: Very SOB at rest, speaks in single words, struggling to breathe, sitting hunched forward, retractions, pulse > 120      Moderate  5. RECURRENT SYMPTOM: "Have you had difficulty breathing before?" If Yes, ask: "When was the last time?" and "What happened that time?"      No  6. CARDIAC HISTORY: "Do you have any history of heart disease?" (e.g., heart attack, angina, bypass surgery, angioplasty)      None  7. LUNG HISTORY: "Do you have any history of lung disease?"  (e.g., pulmonary embolus, asthma, emphysema)     Interstitial Lung Disease  8. CAUSE: "What do you think is causing the breathing problem?"      Unsure  9. OTHER SYMPTOMS: "Do you have any other symptoms? (e.g., dizziness, runny nose, cough, chest pain, fever)     Occasional Cough with Phlegm  10. O2 SATURATION MONITOR:  "Do you use an oxygen saturation monitor (pulse oximeter) at home?" If Yes, ask: "What is your reading (oxygen level) today?" "What is your usual oxygen saturation reading?" (e.g., 95%)       95  11. PREGNANCY: "Is there any chance  you are pregnant?" "When was your last menstrual period?"       No and No  12. TRAVEL: "Have you traveled out of the country in the last month?" (e.g., travel history, exposures)       No  Protocols used: Breathing Difficulty-A-AH

## 2023-12-17 NOTE — Telephone Encounter (Signed)
 Noted. Nothing further needed.

## 2023-12-18 DIAGNOSIS — I700 Atherosclerosis of aorta: Secondary | ICD-10-CM | POA: Diagnosis not present

## 2023-12-18 DIAGNOSIS — I10 Essential (primary) hypertension: Secondary | ICD-10-CM | POA: Diagnosis not present

## 2023-12-19 ENCOUNTER — Ambulatory Visit
Admission: RE | Admit: 2023-12-19 | Discharge: 2023-12-19 | Disposition: A | Discharge status: Home / Self Care | Source: Ambulatory Visit | Attending: Nurse Practitioner | Admitting: Nurse Practitioner

## 2023-12-19 ENCOUNTER — Ambulatory Visit: Admitting: Nurse Practitioner

## 2023-12-19 ENCOUNTER — Encounter: Payer: Self-pay | Admitting: Nurse Practitioner

## 2023-12-19 VITALS — BP 118/80 | HR 89 | Temp 98.0°F | Ht 64.0 in | Wt 158.6 lb

## 2023-12-19 DIAGNOSIS — J309 Allergic rhinitis, unspecified: Secondary | ICD-10-CM | POA: Diagnosis not present

## 2023-12-19 DIAGNOSIS — R058 Other specified cough: Secondary | ICD-10-CM | POA: Diagnosis not present

## 2023-12-19 DIAGNOSIS — J849 Interstitial pulmonary disease, unspecified: Secondary | ICD-10-CM | POA: Diagnosis not present

## 2023-12-19 DIAGNOSIS — J42 Unspecified chronic bronchitis: Secondary | ICD-10-CM | POA: Diagnosis not present

## 2023-12-19 DIAGNOSIS — R918 Other nonspecific abnormal finding of lung field: Secondary | ICD-10-CM | POA: Diagnosis not present

## 2023-12-19 DIAGNOSIS — J40 Bronchitis, not specified as acute or chronic: Secondary | ICD-10-CM | POA: Diagnosis not present

## 2023-12-19 LAB — NITRIC OXIDE: Nitric Oxide: 8

## 2023-12-19 MED ORDER — LEVOCETIRIZINE DIHYDROCHLORIDE 5 MG PO TABS
5.0000 mg | ORAL_TABLET | Freq: Every evening | ORAL | 5 refills | Status: DC
Start: 2023-12-19 — End: 2024-06-13

## 2023-12-19 MED ORDER — BENZONATATE 200 MG PO CAPS: 200.0000 mg | ORAL_CAPSULE | Freq: Three times a day (TID) | ORAL | 1 refills | Status: DC | PRN

## 2023-12-19 MED ORDER — FLUTICASONE PROPIONATE 50 MCG/ACT NA SUSP: 2.0000 | Freq: Every day | NASAL | 2 refills | Status: DC

## 2023-12-19 MED ORDER — PROMETHAZINE-DM 6.25-15 MG/5ML PO SYRP: 5.0000 mL | ORAL_SOLUTION | Freq: Four times a day (QID) | ORAL | 0 refills | Status: DC | PRN

## 2023-12-19 MED ORDER — PREDNISONE 10 MG PO TABS: ORAL_TABLET | ORAL | 0 refills | Status: DC

## 2023-12-19 NOTE — Assessment & Plan Note (Signed)
 Very mild. Felt to be postinfectious/inflammatory. Prior ANA was positive; however, no further workup recommended by rheum. If symptoms persist, recommend repeat CT chest and PFTs to ensure disease has not progressed.

## 2023-12-19 NOTE — Assessment & Plan Note (Signed)
 Constellation of symptoms consistent with upper airway cough/irritable larynx, likely related to postnasal drainage. Exhaled nitric oxide testing normal today, suggesting minimal inflammation in the lungs. Will target sinus symptoms and cough control measures. Limit further inflammation/irritation. She did receive benefit from prior steroid course so will rechallenge her with pred taper. CXR to rule out superimposed infection given timeline and failure to improve, but lung exam clear today. Will hold off on further antimicrobial therapy for now. Close follow up  Patient Instructions  Albuterol inhaler 2 puffs every 6 hours as needed for shortness of breath or wheezing. Notify if symptoms persist despite rescue inhaler/neb use.  Continue Advair 2 puffs Twice daily. Brush tongue and rinse mouth afterwards  -Stop zyrtec. Start levocetirizine (Xyzal) 1 tab daily for allergies -Flonase nasal spray 2 sprays each nostril daily  -Chlorpheniramine over the counter 4 mg tablets At bedtime as needed for cough/drainage  -Prednisone taper. 4 tabs for 2 days, then 3 tabs for 2 days, 2 tabs for 2 days, then 1 tab for 2 days, then stop. Take in AM with food. -Restart benzonatate 1 capsule Three times a day for cough. Use consistently over the next few days to a week to help suppress the cough -Promethazine DM cough syrup 5 mL every 6 hours as needed for cough. May cause drowsiness. Do not drive after taking.   Chest x ray today  Upper airway cough syndrome: Suppress your cough to allow your larynx (voice box) to heal.  Limit talking for the next few days. Avoid throat clearing. Work on cough suppression with the above recommended suppressants.  Use sugar free hard candies or non-menthol cough drops during this time to soothe your throat.  Warm tea with honey and lemon.   Follow up in 2 weeks with Dr. Washington Hacker or Katie Blessing Zaucha,NP. If symptoms do not improve or worsen, please contact office for sooner follow up or seek  emergency care.

## 2023-12-19 NOTE — Progress Notes (Signed)
 @Patient  ID: Ann [name], female    DOB: 1946-02-27, 78 y.o.   MRN: [id number]  Chief Complaint  Patient presents with   Follow-up    Cough persistent, worse in am and at bedtime.  SOB with exertion.    Referring provider: Wilfrid Lund, PA  HPI: 78 year old female, former smoker followed for ILD and nocturnal hypoxia. She is a patient of Dr. George Hugh and last seen in office 09/14/2023. Past medical history significant for HTN, allergic rhinitis, HLD.   TEST/EVENTS:  04/02/2022 CTA chest: atherosclerosis. Large hiatal hernia. Ground glass opacities within basilar portions of lungs.  02/12/2023 PFT: FVC 85, FEV1 93, ratio 85, TLC 73, DLCOcor 62   09/14/2023: OV with Dr. Everardo All. Started on nocturnal oxygen at her last OV. She's using Advair Twice daily. Feels oxygen and inhalers help with her breathing. No rescue inhaler use. Denies shortness of breath, wheezing, coughing. Mild ILD (hx of ANA+, no further workup per rheum); mild reduced DLCO. PFT ordered. When sick, increase Advair up to three times a day. If no improvement in 24-48 hours, call for evaluation.   12/19/2023: Today - follow up Discussed the use of AI scribe software for clinical note transcription with the patient, who gave verbal consent to proceed.  History of Present Illness   Ann [name] is a 78 year old female with a history of mild post infectious/inflammatory ILD, bronchitis and environmental allergies who presents with persistent subacute cough and shortness of breath.  She has been experiencing a persistent cough since late February, initially with significant greenish phlegm, which has since resolved for the most part. Previous treatment with doxycycline and a short course of prednisone provided some relief but did not completely resolve her symptoms. Seemed to return after she completed therapy.   She experiences increased shortness of breath, particularly during activities such as grocery  shopping, which she finds exhausting. She also notes hoarseness in her voice. No significant sinus congestion, drainage, or reflux symptoms. No wheezing, fever, chills, or hemoptysis. She does find she's been sneezing more recently.   She has a history of environmental allergies and elevated peripheral eosinophils noted in the past. She has been taking Zyrtec for a long time to manage her allergies and reports occasional watery eyes too.  She uses Advair, two puffs twice daily, and has been advised to use oxygen at night. However, due to home renovations, she has been staying in a motel and has not been using her oxygen machine consistently over the past few weeks. Her oxygen levels have been in the low to mid-90s on room air.   FeNO 8 ppb       No Known Allergies  Immunization History  Administered Date(s) Administered   Influenza Split 05/28/2019, 05/27/2020, 05/11/2021   Influenza, High Dose Seasonal PF 06/18/2018, 05/28/2019   Influenza-Unspecified 09/05/2015, 07/05/2017   PFIZER(Purple Top)SARS-COV-2 Vaccination 10/11/2019, 11/01/2019, 06/19/2020, 04/13/2021   Pneumococcal Conjugate-13 07/04/2016, 07/17/2016   Pneumococcal Polysaccharide-23 12/09/2019   Tdap 02/11/2020   Zoster, Live 02/11/2020, 05/27/2020    Past Medical History:  Diagnosis Date   Bronchitis, chronic (HCC)    Hyperlipidemia    Hypertension    Interstitial lung disease (HCC) 2020   pt sees a pulmonologist   Pneumonia    Pulmonary nodule    Sepsis (HCC)     Tobacco History: Social History   Tobacco Use  Smoking Status Former   Current packs/day: 0.00   Average packs/day: 0.1 packs/day for 12.0 years (1.2 ttl  pk-yrs)   Types: Cigarettes   Start date: 1964   Quit date: 1976   Years since quitting: 49.3  Smokeless Tobacco Never  Tobacco Comments   during smoking years only smoked ocassionally   Counseling given: Not Answered Tobacco comments: during smoking years only smoked  ocassionally   Outpatient Medications Prior to Visit  Medication Sig Dispense Refill   acetaminophen (TYLENOL) 500 MG tablet Take 1,000 mg by mouth every 6 (six) hours as needed for mild pain.     ADVAIR HFA 115-21 MCG/ACT inhaler Inhale 2 puffs into the lungs 2 (two) times daily. 12 g 11   atorvastatin (LIPITOR) 20 MG tablet Take 20 mg by mouth daily.     calcium carbonate (OS-CAL - DOSED IN MG OF ELEMENTAL CALCIUM) 1250 (500 Ca) MG tablet Take 1 tablet by mouth daily with breakfast.     Cholecalciferol (VITAMIN D3) 50 MCG (2000 UT) TABS Take 2,000 Units by mouth daily.     clobetasol ointment (TEMOVATE) 0.05 % Apply 1 application topically daily. Apply daily to skin to affected area every day for 30 days     lisinopril-hydrochlorothiazide (ZESTORETIC) 20-25 MG tablet Take 1 tablet by mouth daily.     Multiple Vitamins-Minerals (PRESERVISION AREDS 2+MULTI VIT) CAPS      cetirizine (ZYRTEC) 10 MG tablet Take 10 mg by mouth daily.     benzonatate (TESSALON) 100 MG capsule Take 1 capsule (100 mg total) by mouth 3 (three) times daily as needed for cough. (Patient not taking: Reported on 12/19/2023) 30 capsule 0   doxycycline (VIBRA-TABS) 100 MG tablet Take 1 tablet (100 mg total) by mouth 2 (two) times daily. (Patient not taking: Reported on 12/19/2023) 14 tablet 0   predniSONE (DELTASONE) 20 MG tablet Take 2 tablets (40 mg total) by mouth daily with breakfast. (Patient not taking: Reported on 12/19/2023) 10 tablet 0   No facility-administered medications prior to visit.     Review of Systems:   Constitutional: No weight loss or gain, night sweats, fevers, chills, or lassitude. +fatigue  HEENT: No headaches, difficulty swallowing, tooth/dental problems, or sore throat. No itching, ear ache +sneezing, watery eyes, hoarse voice  CV:  No chest pain, orthopnea, PND, swelling in lower extremities, anasarca, dizziness, palpitations, syncope Resp: +shortness of breath with exertion; primarily non  productive cough. No excess mucus or change in color of mucus. No hemoptysis. No wheezing.  No chest wall deformity GI:  No heartburn, indigestion, abdominal pain, nausea, vomiting, diarrhea, change in bowel habits, loss of appetite, bloody stools.  GU: No dysuria, change in color of urine, urgency or frequency, hematuria  Skin: No rash, lesions, ulcerations MSK:  No joint pain or swelling.  Neuro: No dizziness or lightheadedness.  Psych: No depression or anxiety. Mood stable.     Physical Exam:  BP 118/80 (BP Location: Right Arm, Patient Position: Sitting, Cuff Size: Normal)   Pulse 89   Temp 98 F (36.7 C) (Oral)   Ht 5\' 4"  (1.626 m)   Wt 158 lb 9.6 oz (71.9 kg)   SpO2 94%   BMI 27.22 kg/m   GEN: Pleasant, interactive, well-appearing; in no acute distress HEENT:  Normocephalic and atraumatic. PERRLA. Sclera white. Nasal turbinates erythematous, moist and patent bilaterally. White rhinorrhea present. Oropharynx erythematous and moist, without exudate or edema. No lesions, ulcerations NECK:  Supple w/ fair ROM. No JVD present. Normal carotid impulses w/o bruits. Thyroid symmetrical with no goiter or nodules palpated. Submandibular adenopathy CV: RRR, no m/r/g, no peripheral  edema. Pulses intact, +2 bilaterally. No cyanosis, pallor or clubbing. PULMONARY:  Unlabored, regular breathing. Clear bilaterally A&P w/o wheezes/rales/rhonchi. No accessory muscle use.  GI: BS present and normoactive. Soft, non-tender to palpation. No organomegaly or masses detected.  MSK: No erythema, warmth or tenderness. Cap refil <2 sec all extrem. No deformities or joint swelling noted.  Neuro: A/Ox3. No focal deficits noted.   Skin: Warm, no lesions or rashe Psych: Normal affect and behavior. Judgement and thought content appropriate.     Lab Results:  CBC    Component Value Date/Time   WBC 8.7 06/23/2022 1054   RBC 4.38 06/23/2022 1054   HGB 13.7 06/23/2022 1054   HCT 39.9 06/23/2022 1054   PLT  255 06/23/2022 1054   MCV 91.1 06/23/2022 1054   MCH 31.3 06/23/2022 1054   MCHC 34.3 06/23/2022 1054   RDW 13.1 06/23/2022 1054   LYMPHSABS 1.5 06/23/2022 1054   MONOABS 0.6 06/23/2022 1054   EOSABS 0.5 06/23/2022 1054   BASOSABS 0.1 06/23/2022 1054    BMET    Component Value Date/Time   NA 137 06/23/2022 1054   K 3.7 06/23/2022 1054   CL 100 06/23/2022 1054   CO2 25 06/23/2022 1054   GLUCOSE 110 (H) 06/23/2022 1054   BUN 14 06/23/2022 1054   CREATININE 0.67 06/23/2022 1054   CALCIUM 9.3 06/23/2022 1054   GFRNONAA >60 06/23/2022 1054   GFRAA >60 03/26/2019 0423    BNP No results found for: "BNP"   Imaging:  No results found.  Administration History     None          Latest Ref Rng & Units 02/12/2023    8:37 AM 02/13/2022    9:32 AM 09/06/2020    9:39 AM 09/22/2019    2:54 PM  PFT Results  FVC-Pre L 2.37  2.43  2.40  2.63   FVC-Predicted Pre % 85  86  84  91   FVC-Post L 2.22  2.22  2.32  2.46   FVC-Predicted Post % 80  79  81  85   Pre FEV1/FVC % % 82  82  77  76   Post FEV1/FCV % % 85  84  81  79   FEV1-Pre L 1.94  1.98  1.84  2.00   FEV1-Predicted Pre % 93  94  86  92   FEV1-Post L 1.89  1.86  1.88  1.95   DLCO uncorrected ml/min/mmHg 11.91  11.71  13.63  14.39   DLCO UNC% % 62  60  70  74   DLCO corrected ml/min/mmHg 11.91  11.71  13.63    DLCO COR %Predicted % 62  60  70    DLVA Predicted % 84  76  88  88   TLC L 3.74  4.78  5.03  3.89   TLC % Predicted % 73  93  98  76   RV % Predicted % 62  102  48  60     Lab Results  Component Value Date   NITRICOXIDE 8 12/19/2023        Assessment & Plan:   Upper airway cough syndrome Constellation of symptoms consistent with upper airway cough/irritable larynx, likely related to postnasal drainage. Exhaled nitric oxide testing normal today, suggesting minimal inflammation in the lungs. Will target sinus symptoms and cough control measures. Limit further inflammation/irritation. She did receive  benefit from prior steroid course so will rechallenge her with pred taper. CXR to rule  out superimposed infection given timeline and failure to improve, but lung exam clear today. Will hold off on further antimicrobial therapy for now. Close follow up  Patient Instructions  Albuterol inhaler 2 puffs every 6 hours as needed for shortness of breath or wheezing. Notify if symptoms persist despite rescue inhaler/neb use.  Continue Advair 2 puffs Twice daily. Brush tongue and rinse mouth afterwards  -Stop zyrtec. Start levocetirizine (Xyzal) 1 tab daily for allergies -Flonase nasal spray 2 sprays each nostril daily  -Chlorpheniramine over the counter 4 mg tablets At bedtime as needed for cough/drainage  -Prednisone taper. 4 tabs for 2 days, then 3 tabs for 2 days, 2 tabs for 2 days, then 1 tab for 2 days, then stop. Take in AM with food. -Restart benzonatate 1 capsule Three times a day for cough. Use consistently over the next few days to a week to help suppress the cough -Promethazine DM cough syrup 5 mL every 6 hours as needed for cough. May cause drowsiness. Do not drive after taking.   Chest x ray today  Upper airway cough syndrome: Suppress your cough to allow your larynx (voice box) to heal.  Limit talking for the next few days. Avoid throat clearing. Work on cough suppression with the above recommended suppressants.  Use sugar free hard candies or non-menthol cough drops during this time to soothe your throat.  Warm tea with honey and lemon.   Follow up in 2 weeks with Dr. Washington Hacker or Katie Jimie Kuwahara,NP. If symptoms do not improve or worsen, please contact office for sooner follow up or seek emergency care.    Allergic rhinitis See above. Change daily antihistamine given length of time of cetrizine and possible reduced efficacy.   ILD (interstitial lung disease) (HCC) Very mild. Felt to be postinfectious/inflammatory. Prior ANA was positive; however, no further workup recommended by rheum. If  symptoms persist, recommend repeat CT chest and PFTs to ensure disease has not progressed.    Advised if symptoms do not improve or worsen, to please contact office for sooner follow up or seek emergency care.   I spent 35 minutes of dedicated to the care of this patient on the date of this encounter to include pre-visit review of records, face-to-face time with the patient discussing conditions above, post visit ordering of testing, clinical documentation with the electronic health record, making appropriate referrals as documented, and communicating necessary findings to members of the patients care team.  Roetta Clarke, NP 12/19/2023  Pt aware and understands NP's role.

## 2023-12-19 NOTE — Assessment & Plan Note (Signed)
 See above. Change daily antihistamine given length of time of cetrizine and possible reduced efficacy.

## 2023-12-19 NOTE — Patient Instructions (Addendum)
 Albuterol inhaler 2 puffs every 6 hours as needed for shortness of breath or wheezing. Notify if symptoms persist despite rescue inhaler/neb use.  Continue Advair 2 puffs Twice daily. Brush tongue and rinse mouth afterwards  -Stop zyrtec. Start levocetirizine (Xyzal) 1 tab daily for allergies -Flonase nasal spray 2 sprays each nostril daily  -Chlorpheniramine over the counter 4 mg tablets At bedtime as needed for cough/drainage  -Prednisone taper. 4 tabs for 2 days, then 3 tabs for 2 days, 2 tabs for 2 days, then 1 tab for 2 days, then stop. Take in AM with food. -Restart benzonatate 1 capsule Three times a day for cough. Use consistently over the next few days to a week to help suppress the cough -Promethazine DM cough syrup 5 mL every 6 hours as needed for cough. May cause drowsiness. Do not drive after taking.   Chest x ray today  Upper airway cough syndrome: Suppress your cough to allow your larynx (voice box) to heal.  Limit talking for the next few days. Avoid throat clearing. Work on cough suppression with the above recommended suppressants.  Use sugar free hard candies or non-menthol cough drops during this time to soothe your throat.  Warm tea with honey and lemon.   Follow up in 2 weeks with Dr. Washington Hacker or Katie Cherry [place],NP. If symptoms do not improve or worsen, please contact office for sooner follow up or seek emergency care.

## 2023-12-31 ENCOUNTER — Ambulatory Visit (HOSPITAL_BASED_OUTPATIENT_CLINIC_OR_DEPARTMENT_OTHER): Admitting: Pulmonary Disease

## 2024-01-02 DIAGNOSIS — E7800 Pure hypercholesterolemia, unspecified: Secondary | ICD-10-CM | POA: Diagnosis not present

## 2024-01-02 DIAGNOSIS — I10 Essential (primary) hypertension: Secondary | ICD-10-CM | POA: Diagnosis not present

## 2024-01-02 DIAGNOSIS — I700 Atherosclerosis of aorta: Secondary | ICD-10-CM | POA: Diagnosis not present

## 2024-01-03 ENCOUNTER — Ambulatory Visit (HOSPITAL_BASED_OUTPATIENT_CLINIC_OR_DEPARTMENT_OTHER): Admitting: Pulmonary Disease

## 2024-01-07 NOTE — Progress Notes (Signed)
 CXR showed some opacities in the lungs. She had been treated with abx and steroids. If symptoms improved, recommend repeat CXR at follow up. If she's still having difficulties, may want to consider further workup. Thanks!

## 2024-01-15 DIAGNOSIS — M8580 Other specified disorders of bone density and structure, unspecified site: Secondary | ICD-10-CM | POA: Diagnosis not present

## 2024-01-15 DIAGNOSIS — I700 Atherosclerosis of aorta: Secondary | ICD-10-CM | POA: Diagnosis not present

## 2024-01-15 DIAGNOSIS — J42 Unspecified chronic bronchitis: Secondary | ICD-10-CM | POA: Diagnosis not present

## 2024-01-15 DIAGNOSIS — J849 Interstitial pulmonary disease, unspecified: Secondary | ICD-10-CM | POA: Diagnosis not present

## 2024-01-15 DIAGNOSIS — Z1331 Encounter for screening for depression: Secondary | ICD-10-CM | POA: Diagnosis not present

## 2024-01-15 DIAGNOSIS — R911 Solitary pulmonary nodule: Secondary | ICD-10-CM | POA: Diagnosis not present

## 2024-01-15 DIAGNOSIS — R7303 Prediabetes: Secondary | ICD-10-CM | POA: Diagnosis not present

## 2024-01-15 DIAGNOSIS — G479 Sleep disorder, unspecified: Secondary | ICD-10-CM | POA: Diagnosis not present

## 2024-01-15 DIAGNOSIS — I10 Essential (primary) hypertension: Secondary | ICD-10-CM | POA: Diagnosis not present

## 2024-01-15 DIAGNOSIS — Z0000 Encounter for general adult medical examination without abnormal findings: Secondary | ICD-10-CM | POA: Diagnosis not present

## 2024-01-15 DIAGNOSIS — E7800 Pure hypercholesterolemia, unspecified: Secondary | ICD-10-CM | POA: Diagnosis not present

## 2024-01-16 DIAGNOSIS — I700 Atherosclerosis of aorta: Secondary | ICD-10-CM | POA: Diagnosis not present

## 2024-01-16 DIAGNOSIS — I10 Essential (primary) hypertension: Secondary | ICD-10-CM | POA: Diagnosis not present

## 2024-01-17 ENCOUNTER — Ambulatory Visit (INDEPENDENT_AMBULATORY_CARE_PROVIDER_SITE_OTHER): Admitting: Adult Health

## 2024-01-17 VITALS — BP 144/80 | Temp 97.6°F

## 2024-01-17 DIAGNOSIS — J849 Interstitial pulmonary disease, unspecified: Secondary | ICD-10-CM | POA: Diagnosis not present

## 2024-01-17 DIAGNOSIS — Z87891 Personal history of nicotine dependence: Secondary | ICD-10-CM

## 2024-01-17 DIAGNOSIS — R058 Other specified cough: Secondary | ICD-10-CM

## 2024-01-17 DIAGNOSIS — R490 Dysphonia: Secondary | ICD-10-CM | POA: Diagnosis not present

## 2024-01-17 DIAGNOSIS — J42 Unspecified chronic bronchitis: Secondary | ICD-10-CM

## 2024-01-17 DIAGNOSIS — J309 Allergic rhinitis, unspecified: Secondary | ICD-10-CM | POA: Diagnosis not present

## 2024-01-17 MED ORDER — BENZONATATE 200 MG PO CAPS: 200.0000 mg | ORAL_CAPSULE | Freq: Three times a day (TID) | ORAL | 3 refills | Status: DC | PRN

## 2024-01-17 MED ORDER — ALBUTEROL SULFATE HFA 108 (90 BASE) MCG/ACT IN AERS: 1.0000 | INHALATION_SPRAY | Freq: Four times a day (QID) | RESPIRATORY_TRACT | 2 refills | Status: AC | PRN

## 2024-01-17 NOTE — Patient Instructions (Signed)
 Set up for HRCT chest .  Begin Breztri sample 2 puffs Twice daily until gone then resume Advair  Twice daily   Albuterol  inhaler As needed   Liquid Mucinex DM 2 tsp Twice daily  for cough As needed   Tessalon  Three times a day  As needed  cough .  Continue on Xyzal  At bedtime   Discuss with Primary Provider that Lisinopril  hydrochlorothiazide  could be aggravating your cough .  Follow up in 4-6 weeks and As needed   Please contact office for sooner follow up if symptoms do not improve or worsen or seek emergency care

## 2024-01-17 NOTE — Assessment & Plan Note (Signed)
 Continue on current regimen .

## 2024-01-17 NOTE — Assessment & Plan Note (Signed)
 Slow to resolve her chronic bronchitic exacerbation.  Patient has interstitial lung disease changes with previous mild postinflammatory fibrosis with bibasilar opacities.  Will check high-resolution CT chest for progressive changes or new findings.  Suspect ACE inhibitor could be aggravating her upper airway causing worsening cough.  Would suggest alternative to lisinopril .  Have asked patient to reach out to her primary care provider to manage this continue on chronic rhinitis maintenance regimen.  Hold on additional antibiotics or steroids.  Trial of Breztri.  Plan  Patient Instructions  Set up for HRCT chest .  Begin Breztri sample 2 puffs Twice daily until gone then resume Advair  Twice daily   Albuterol  inhaler As needed   Liquid Mucinex DM 2 tsp Twice daily  for cough As needed   Tessalon  Three times a day  As needed  cough .  Continue on Xyzal  At bedtime   Discuss with Primary Provider that Lisinopril  hydrochlorothiazide  could be aggravating your cough .  Follow up in 4-6 weeks and As needed   Please contact office for sooner follow up if symptoms do not improve or worsen or seek emergency care        Plan  Patient Instructions  Set up for HRCT chest .  Begin Breztri sample 2 puffs Twice daily until gone then resume Advair  Twice daily   Albuterol  inhaler As needed   Liquid Mucinex DM 2 tsp Twice daily  for cough As needed   Tessalon  Three times a day  As needed  cough .  Continue on Xyzal  At bedtime   Discuss with Primary Provider that Lisinopril  hydrochlorothiazide  could be aggravating your cough .  Follow up in 4-6 weeks and As needed   Please contact office for sooner follow up if symptoms do not improve or worsen or seek emergency care

## 2024-01-17 NOTE — Progress Notes (Signed)
 @Patient  ID: Ann [name], female    DOB: [birth year], 78 y.o.   MRN: [id number]  Chief Complaint  Patient presents with   Follow-up    Referring provider: Sinda Duel, PA  HPI: 78 year old female former smoker followed for ILD-very mild postinflammatory fibrosis and chronic respiratory failure on nocturnal oxygen   TEST/EVENTS :  04/02/2022 CTA chest: atherosclerosis. Large hiatal hernia. Ground glass opacities within basilar portions of lungs.  02/12/2023 PFT: FVC 85, FEV1 93, ratio 85, TLC 73, DLCOcor 62   ANA positive-rheumatology workup -no further workup indicated  01/17/2024 Follow up: Bronchitis Patient returns for a 1 month follow-up.  Patient has very mild interstitial lung disease changes with suspected postinflammatory bibasilar opacities. She was seen last visit for an acute bronchitic flare.  She was initially treated with doxycycline  with improvement but residual ongoing cough.  She was given a prednisone  taper last visit.  Since last visit she is feeling no better.  Continues to have ongoing minimally productive cough, chronic hoarseness fatigue low energy and intermittent shortness of breath.  Patient is on a ACE inhibitor.  Last visit, Chest x-ray showed chronic changes without acute process.  She has used promethazine  cough syrup with some benefit but continues to have ongoing cough. She has minimal smoking history.  No pets.  Previous pulmonary function testing showed no airflow obstruction or restriction.  Decreased diffusing capacity.  No dysphagia, GERD.  Is taking allergy medications with no significant postnasal drainage symptoms. Remains on Advair  twice daily does feel that it has helped some.  No Known Allergies  Immunization History  Administered Date(s) Administered   Influenza Split 05/28/2019, 05/27/2020, 05/11/2021   Influenza, High Dose Seasonal PF 06/18/2018, 05/28/2019   Influenza-Unspecified 09/05/2015, 07/05/2017   PFIZER(Purple  Top)SARS-COV-2 Vaccination 10/11/2019, 11/01/2019, 06/19/2020, 04/13/2021   Pneumococcal Conjugate-13 07/04/2016, 07/17/2016   Pneumococcal Polysaccharide-23 12/09/2019   Tdap 02/11/2020   Zoster, Live 02/11/2020, 05/27/2020    Past Medical History:  Diagnosis Date   Bronchitis, chronic (HCC)    Hyperlipidemia    Hypertension    Interstitial lung disease (HCC) 2020   pt sees a pulmonologist   Pneumonia    Pulmonary nodule    Sepsis (HCC)     Tobacco History: Social History   Tobacco Use  Smoking Status Former   Current packs/day: 0.00   Average packs/day: 0.1 packs/day for 12.0 years (1.2 ttl pk-yrs)   Types: Cigarettes   Start date: 1964   Quit date: 1976   Years since quitting: 49.4  Smokeless Tobacco Never  Tobacco Comments   during smoking years only smoked ocassionally   Counseling given: Not Answered Tobacco comments: during smoking years only smoked ocassionally   Outpatient Medications Prior to Visit  Medication Sig Dispense Refill   acetaminophen  (TYLENOL ) 500 MG tablet Take 1,000 mg by mouth every 6 (six) hours as needed for mild pain.     ADVAIR  HFA 115-21 MCG/ACT inhaler Inhale 2 puffs into the lungs 2 (two) times daily. 12 g 11   atorvastatin  (LIPITOR) 20 MG tablet Take 20 mg by mouth daily.     calcium  carbonate (OS-CAL - DOSED IN MG OF ELEMENTAL CALCIUM ) 1250 (500 Ca) MG tablet Take 1 tablet by mouth daily with breakfast.     Cholecalciferol  (VITAMIN D3) 50 MCG (2000 UT) TABS Take 2,000 Units by mouth daily.     clobetasol ointment (TEMOVATE) 0.05 % Apply 1 application topically daily. Apply daily to skin to affected area every day for 30 days  fluticasone  (FLONASE ) 50 MCG/ACT nasal spray Place 2 sprays into both nostrils daily. 18.2 mL 2   levocetirizine (XYZAL ) 5 MG tablet Take 1 tablet (5 mg total) by mouth every evening. 30 tablet 5   lisinopril -hydrochlorothiazide  (ZESTORETIC ) 20-25 MG tablet Take 1 tablet by mouth daily.     Melatonin 10 MG  CAPS Take by mouth.     Multiple Vitamins-Minerals (PRESERVISION AREDS 2+MULTI VIT) CAPS      promethazine -dextromethorphan (PROMETHAZINE -DM) 6.25-15 MG/5ML syrup Take 5 mLs by mouth 4 (four) times daily as needed for cough. 180 mL 0   benzonatate  (TESSALON ) 200 MG capsule Take 1 capsule (200 mg total) by mouth 3 (three) times daily as needed for cough. 30 capsule 1   predniSONE  (DELTASONE ) 10 MG tablet 4 tabs for 2 days, then 3 tabs for 2 days, 2 tabs for 2 days, then 1 tab for 2 days, then stop (Patient not taking: Reported on 01/17/2024) 20 tablet 0   No facility-administered medications prior to visit.     Review of Systems:   Constitutional:   No  weight loss, night sweats,  Fevers, chills, fatigue, or  lassitude.  HEENT:   No headaches,  Difficulty swallowing,  Tooth/dental problems, or  Sore throat,                No sneezing, itching, ear ache, +nasal congestion, post nasal drip,   CV:  No chest pain,  Orthopnea, PND, swelling in lower extremities, anasarca, dizziness, palpitations, syncope.   GI  No heartburn, indigestion, abdominal pain, nausea, vomiting, diarrhea, change in bowel habits, loss of appetite, bloody stools.   Resp: No shortness of breath with exertion or at rest.  No excess mucus, no productive cough,  No non-productive cough,  No coughing up of blood.  No change in color of mucus.  No wheezing.  No chest wall deformity  Skin: no rash or lesions.  GU: no dysuria, change in color of urine, no urgency or frequency.  No flank pain, no hematuria   MS:  No joint pain or swelling.  No decreased range of motion.  No back pain.    Physical Exam  BP (!) 144/80 (BP Location: Left Arm, Cuff Size: Normal)   Temp 97.6 F (36.4 C) (Oral)   GEN: A/Ox3; pleasant , NAD, well nourished    HEENT:  [place]/AT,  NOSE-clear, THROAT-clear, no lesions, no postnasal drip or exudate noted.   NECK:  Supple w/ fair ROM; no JVD; normal carotid impulses w/o bruits; no thyromegaly or  nodules palpated; no lymphadenopathy.    RESP  Clear  P & A; w/o, wheezes/ rales/ or rhonchi. no accessory muscle use, no dullness to percussion  CARD:  RRR, no m/r/g, no peripheral edema, pulses intact, no cyanosis or clubbing.  GI:   Soft & nt; nml bowel sounds; no organomegaly or masses detected.   Musco: Warm bil, no deformities or joint swelling noted.   Neuro: alert, no focal deficits noted.    Skin: Warm, no lesions or rashes    Lab Results:  CBC    Component Value Date/Time   WBC 8.7 06/23/2022 1054   RBC 4.38 06/23/2022 1054   HGB 13.7 06/23/2022 1054   HCT 39.9 06/23/2022 1054   PLT 255 06/23/2022 1054   MCV 91.1 06/23/2022 1054   MCH 31.3 06/23/2022 1054   MCHC 34.3 06/23/2022 1054   RDW 13.1 06/23/2022 1054   LYMPHSABS 1.5 06/23/2022 1054   MONOABS 0.6 06/23/2022 1054  EOSABS 0.5 06/23/2022 1054   BASOSABS 0.1 06/23/2022 1054    BMET    Component Value Date/Time   NA 137 06/23/2022 1054   K 3.7 06/23/2022 1054   CL 100 06/23/2022 1054   CO2 25 06/23/2022 1054   GLUCOSE 110 (H) 06/23/2022 1054   BUN 14 06/23/2022 1054   CREATININE 0.67 06/23/2022 1054   CALCIUM  9.3 06/23/2022 1054   GFRNONAA >60 06/23/2022 1054   GFRAA >60 03/26/2019 0423    BNP No results found for: "BNP"  ProBNP No results found for: "PROBNP"  Imaging: DG Chest 2 View Result Date: 12/31/2023 CLINICAL DATA:  78 year old female with bronchitis EXAM: CHEST - 2 VIEW COMPARISON:  03/25/2021 FINDINGS: Cardiomediastinal silhouette unchanged in size and contour. Low lung volumes persist. Double density projecting over the lower mediastinum is unchanged, compatible with hiatal hernia. Mixed reticulonodular opacities of the bilateral lungs, new from the comparison. No pneumothorax or pleural effusion. Bronchial wall thickening. Degenerative changes of the spine. IMPRESSION: Reticulonodular opacities of the lungs, potentially atypical infection or bronchitis. Electronically Signed   By:  Myrlene Asper D.O.   On: 12/31/2023 12:15    Administration History     None          Latest Ref Rng & Units 02/12/2023    8:37 AM 02/13/2022    9:32 AM 09/06/2020    9:39 AM 09/22/2019    2:54 PM  PFT Results  FVC-Pre L 2.37  2.43  2.40  2.63   FVC-Predicted Pre % 85  86  84  91   FVC-Post L 2.22  2.22  2.32  2.46   FVC-Predicted Post % 80  79  81  85   Pre FEV1/FVC % % 82  82  77  76   Post FEV1/FCV % % 85  84  81  79   FEV1-Pre L 1.94  1.98  1.84  2.00   FEV1-Predicted Pre % 93  94  86  92   FEV1-Post L 1.89  1.86  1.88  1.95   DLCO uncorrected ml/min/mmHg 11.91  11.71  13.63  14.39   DLCO UNC% % 62  60  70  74   DLCO corrected ml/min/mmHg 11.91  11.71  13.63    DLCO COR %Predicted % 62  60  70    DLVA Predicted % 84  76  88  88   TLC L 3.74  4.78  5.03  3.89   TLC % Predicted % 73  93  98  76   RV % Predicted % 62  102  48  60     Lab Results  Component Value Date   NITRICOXIDE 8 12/19/2023        Assessment & Plan:   Chronic bronchitis (HCC) Slow to resolve her chronic bronchitic exacerbation.  Patient has interstitial lung disease changes with previous mild postinflammatory fibrosis with bibasilar opacities.  Will check high-resolution CT chest for progressive changes or new findings.  Suspect ACE inhibitor could be aggravating her upper airway causing worsening cough.  Would suggest alternative to lisinopril .  Have asked patient to reach out to her primary care provider to manage this continue on chronic rhinitis maintenance regimen.  Hold on additional antibiotics or steroids.  Trial of Breztri.  Plan  Patient Instructions  Set up for HRCT chest .  Begin Breztri sample 2 puffs Twice daily until gone then resume Advair  Twice daily   Albuterol  inhaler As needed   Liquid Mucinex  DM 2 tsp Twice daily  for cough As needed   Tessalon  Three times a day  As needed  cough .  Continue on Xyzal  At bedtime   Discuss with Primary Provider that Lisinopril   hydrochlorothiazide  could be aggravating your cough .  Follow up in 4-6 weeks and As needed   Please contact office for sooner follow up if symptoms do not improve or worsen or seek emergency care        Plan  Patient Instructions  Set up for HRCT chest .  Begin Breztri sample 2 puffs Twice daily until gone then resume Advair  Twice daily   Albuterol  inhaler As needed   Liquid Mucinex DM 2 tsp Twice daily  for cough As needed   Tessalon  Three times a day  As needed  cough .  Continue on Xyzal  At bedtime   Discuss with Primary Provider that Lisinopril  hydrochlorothiazide  could be aggravating your cough .  Follow up in 4-6 weeks and As needed   Please contact office for sooner follow up if symptoms do not improve or worsen or seek emergency care        Allergic rhinitis Continue on current regimen.  ILD (interstitial lung disease) (HCC) Bibasilar groundglass opacities-considered mild-possible postinflammatory/postinfectious fibrosis.  Will repeat high-resolution CT chest to evaluate stability and possible aggressive changes.  Hoarseness Chronic coarseness and cough.  Upper airway irritability from ongoing coughing for the last few months.  Continue on aggressive cough suppression regimen.  If not improved on return consider referral to ENT.     Roena Clark, NP 01/17/2024

## 2024-01-17 NOTE — Assessment & Plan Note (Signed)
 Bibasilar groundglass opacities-considered mild-possible postinflammatory/postinfectious fibrosis.  Will repeat high-resolution CT chest to evaluate stability and possible aggressive changes.

## 2024-01-17 NOTE — Assessment & Plan Note (Signed)
 Chronic coarseness and cough.  Upper airway irritability from ongoing coughing for the last few months.  Continue on aggressive cough suppression regimen.  If not improved on return consider referral to ENT.

## 2024-01-21 DIAGNOSIS — M810 Age-related osteoporosis without current pathological fracture: Secondary | ICD-10-CM | POA: Diagnosis not present

## 2024-01-31 ENCOUNTER — Ambulatory Visit

## 2024-02-02 DIAGNOSIS — I10 Essential (primary) hypertension: Secondary | ICD-10-CM | POA: Diagnosis not present

## 2024-02-02 DIAGNOSIS — I700 Atherosclerosis of aorta: Secondary | ICD-10-CM | POA: Diagnosis not present

## 2024-02-02 DIAGNOSIS — E7800 Pure hypercholesterolemia, unspecified: Secondary | ICD-10-CM | POA: Diagnosis not present

## 2024-02-08 ENCOUNTER — Ambulatory Visit (HOSPITAL_COMMUNITY)

## 2024-02-15 DIAGNOSIS — I10 Essential (primary) hypertension: Secondary | ICD-10-CM | POA: Diagnosis not present

## 2024-02-15 DIAGNOSIS — I700 Atherosclerosis of aorta: Secondary | ICD-10-CM | POA: Diagnosis not present

## 2024-02-21 ENCOUNTER — Ambulatory Visit (HOSPITAL_COMMUNITY)
Admission: RE | Admit: 2024-02-21 | Discharge: 2024-02-21 | Disposition: A | Discharge status: Home / Self Care | Source: Ambulatory Visit | Attending: Adult Health | Admitting: Adult Health

## 2024-02-21 DIAGNOSIS — Z1231 Encounter for screening mammogram for malignant neoplasm of breast: Secondary | ICD-10-CM | POA: Diagnosis not present

## 2024-02-21 DIAGNOSIS — J849 Interstitial pulmonary disease, unspecified: Secondary | ICD-10-CM | POA: Insufficient documentation

## 2024-02-21 DIAGNOSIS — R590 Localized enlarged lymph nodes: Secondary | ICD-10-CM | POA: Diagnosis not present

## 2024-02-21 DIAGNOSIS — I728 Aneurysm of other specified arteries: Secondary | ICD-10-CM | POA: Diagnosis not present

## 2024-02-21 DIAGNOSIS — R058 Other specified cough: Secondary | ICD-10-CM | POA: Insufficient documentation

## 2024-03-03 DIAGNOSIS — I700 Atherosclerosis of aorta: Secondary | ICD-10-CM | POA: Diagnosis not present

## 2024-03-03 DIAGNOSIS — E7800 Pure hypercholesterolemia, unspecified: Secondary | ICD-10-CM | POA: Diagnosis not present

## 2024-03-03 DIAGNOSIS — I10 Essential (primary) hypertension: Secondary | ICD-10-CM | POA: Diagnosis not present

## 2024-03-11 ENCOUNTER — Ambulatory Visit: Payer: Self-pay | Admitting: Adult Health

## 2024-03-12 NOTE — Progress Notes (Signed)
 Called and spoke with patient, advised of results/recommendations per Madelin Stank NP.  Scheduled patient for PFT at [facility]. On Thursday August 14th at 10 am, advised to arrive by 9:45 am for check in.  I was unable to find anything same day at [facility] or [employer] [facility] on August 18th (day of her f/u with Tammy).  She verbalized understanding.  Nothing further needed.

## 2024-03-16 DIAGNOSIS — I700 Atherosclerosis of aorta: Secondary | ICD-10-CM | POA: Diagnosis not present

## 2024-03-16 DIAGNOSIS — I10 Essential (primary) hypertension: Secondary | ICD-10-CM | POA: Diagnosis not present

## 2024-03-21 ENCOUNTER — Other Ambulatory Visit: Payer: Self-pay | Admitting: Pulmonary Disease

## 2024-04-03 DIAGNOSIS — I700 Atherosclerosis of aorta: Secondary | ICD-10-CM | POA: Diagnosis not present

## 2024-04-03 DIAGNOSIS — E7800 Pure hypercholesterolemia, unspecified: Secondary | ICD-10-CM | POA: Diagnosis not present

## 2024-04-03 DIAGNOSIS — I10 Essential (primary) hypertension: Secondary | ICD-10-CM | POA: Diagnosis not present

## 2024-04-15 DIAGNOSIS — I700 Atherosclerosis of aorta: Secondary | ICD-10-CM | POA: Diagnosis not present

## 2024-04-15 DIAGNOSIS — I10 Essential (primary) hypertension: Secondary | ICD-10-CM | POA: Diagnosis not present

## 2024-04-17 ENCOUNTER — Ambulatory Visit: Admitting: Pulmonary Disease

## 2024-04-17 DIAGNOSIS — G4734 Idiopathic sleep related nonobstructive alveolar hypoventilation: Secondary | ICD-10-CM

## 2024-04-17 DIAGNOSIS — J849 Interstitial pulmonary disease, unspecified: Secondary | ICD-10-CM

## 2024-04-17 LAB — PULMONARY FUNCTION TEST
DL/VA % pred: 114 %
DL/VA: 4.65 ml/min/mmHg/L
DLCO unc % pred: 60 %
DLCO unc: 11.56 ml/min/mmHg
FEF 25-75 Post: 2.01 L/s
FEF 25-75 Pre: 2.31 L/s
FEF2575-%Change-Post: -12 %
FEF2575-%Pred-Post: 136 %
FEF2575-%Pred-Pre: 156 %
FEV1-%Change-Post: -2 %
FEV1-%Pred-Post: 90 %
FEV1-%Pred-Pre: 92 %
FEV1-Post: 1.8 L
FEV1-Pre: 1.85 L
FEV1FVC-%Change-Post: 0 %
FEV1FVC-%Pred-Pre: 112 %
FEV6-%Change-Post: 0 %
FEV6-%Pred-Post: 85 %
FEV6-%Pred-Pre: 86 %
FEV6-Post: 2.18 L
FEV6-Pre: 2.2 L
FEV6FVC-%Change-Post: 0 %
FEV6FVC-%Pred-Post: 105 %
FEV6FVC-%Pred-Pre: 105 %
FVC-%Change-Post: -1 %
FVC-%Pred-Post: 81 %
FVC-%Pred-Pre: 82 %
FVC-Post: 2.18 L
FVC-Pre: 2.21 L
Post FEV1/FVC ratio: 83 %
Post FEV6/FVC ratio: 100 %
Pre FEV1/FVC ratio: 84 %
Pre FEV6/FVC Ratio: 99 %
RV % pred: 46 %
RV: 1.12 L
TLC % pred: 61 %
TLC: 3.16 L

## 2024-04-17 NOTE — Patient Instructions (Signed)
 Full pft performed today

## 2024-04-17 NOTE — Progress Notes (Signed)
 Full pft performed today

## 2024-04-21 ENCOUNTER — Ambulatory Visit (INDEPENDENT_AMBULATORY_CARE_PROVIDER_SITE_OTHER): Admitting: Adult Health

## 2024-04-21 ENCOUNTER — Encounter: Payer: Self-pay | Admitting: Adult Health

## 2024-04-21 VITALS — BP 120/69 | HR 86 | Temp 97.6°F | Ht 64.0 in | Wt 150.0 lb

## 2024-04-21 DIAGNOSIS — R0609 Other forms of dyspnea: Secondary | ICD-10-CM

## 2024-04-21 DIAGNOSIS — G4734 Idiopathic sleep related nonobstructive alveolar hypoventilation: Secondary | ICD-10-CM

## 2024-04-21 DIAGNOSIS — R490 Dysphonia: Secondary | ICD-10-CM

## 2024-04-21 DIAGNOSIS — R058 Other specified cough: Secondary | ICD-10-CM | POA: Diagnosis not present

## 2024-04-21 DIAGNOSIS — J42 Unspecified chronic bronchitis: Secondary | ICD-10-CM | POA: Diagnosis not present

## 2024-04-21 DIAGNOSIS — J309 Allergic rhinitis, unspecified: Secondary | ICD-10-CM | POA: Diagnosis not present

## 2024-04-21 DIAGNOSIS — J849 Interstitial pulmonary disease, unspecified: Secondary | ICD-10-CM

## 2024-04-21 NOTE — Progress Notes (Signed)
 @Patient  ID: Ann [name], female    DOB: 23-Nov-1945, 78 y.o.   MRN: [id number]  Chief Complaint  Patient presents with   Follow-up    Referring provider: Alben Therisa MATSU, PA  HPI: 78 yo female former smoker followed for ILD, Chronic bronchitis and chronic respiratory failure on nocturnal oxygen .  Medical history significant for MCA aneurysm s/p repair   TEST/EVENTS :  04/02/2022 CTA chest: atherosclerosis. Large hiatal hernia. Ground glass opacities within basilar portions of lungs.   02/12/2023 PFT: FVC 85, FEV1 93, ratio 85, TLC 73, DLCOcor 62    Autoimmune work-up revealed +ANA 1:160 with myositis panel, sjogren's antibodies and scleroderma screen returning negative. Rheumatology with no further work-up indicated.   04/21/2024 Follow up: ILD, Cough  Discussed the use of AI scribe software for clinical note transcription with the patient, who gave verbal consent to proceed.  History of Present Illness Ann [name] is a 78 year old female with interstitial lung disease who presents for 3 month follow up.   Last visit she was having an ongoing cough after slow to resolve exacerbation of bronchitis. Symptoms improved after treatment with antibiotics and prednisone . She was recommended on cough control regimen. Recommendations to discontinue ACE inhibitor.s Since last visit, Lisinopril  was discontinued . Currently using Advair . Says cough is much better, nearly completely gone. She experiences occasional hoarseness, persisting for years. She has not consulted an ENT specialist for this issue.  She experiences minimal shortness of breath, primarily when walking long distances.   She is followed for mild ILD disease with notable changes on imaging dating back to 2020 with CT chset showing subpleural reticulation. PFT have shown no significant restriction or obstruction. Decreased diffusing capacity around 60%.  She has not been on medications for autoimmune conditions. She  has a minimal smoking history, having quit in 1976, and denies exposure to potential lung irritants such as birds, chickens, hot tubs, or mold. She is unsure if she has down pillows or comforter.  Previous autoimmune serology was unrevealing. ANA positive. Rheumatology referral with recommendations for no further workup.  HRCT chest done on 02/21/24 showed peripheral and basilar subpleural reticulation, ground glass and traction bronchiectasis, largely new from 04/02/22 -probable UIP. Large hiatal hernia, 13 mm splenic artery aneurysm. PFT done on 04/17/24 are stable with FVC 82%, Ratio 84 and FEV1 92%, DLCO 60.   She lives at home, drives, and performs housework and shopping. She is semi-active and experiences minimal shortness of breath with activity.    No Known Allergies  Immunization History  Administered Date(s) Administered   Fluzone Influenza virus vaccine,trivalent (IIV3), split virus 05/28/2019, 05/27/2020, 05/11/2021   Influenza, High Dose Seasonal PF 06/18/2018, 05/28/2019   Influenza-Unspecified 09/05/2015, 07/05/2017   PFIZER(Purple Top)SARS-COV-2 Vaccination 10/11/2019, 11/01/2019, 06/19/2020, 04/13/2021   Pneumococcal Conjugate-13 07/04/2016, 07/17/2016   Pneumococcal Polysaccharide-23 12/09/2019   Tdap 02/11/2020   Zoster, Live 02/11/2020, 05/27/2020    Past Medical History:  Diagnosis Date   Bronchitis, chronic (HCC)    Hyperlipidemia    Hypertension    Interstitial lung disease (HCC) 2020   pt sees a pulmonologist   Pneumonia    Pulmonary nodule    Sepsis (HCC)     Tobacco History: Social History   Tobacco Use  Smoking Status Former   Current packs/day: 0.00   Average packs/day: 0.1 packs/day for 12.0 years (1.2 ttl pk-yrs)   Types: Cigarettes   Start date: 1964   Quit date: 72   Years since quitting: 59.6  Smokeless Tobacco Never  Tobacco Comments   during smoking years only smoked ocassionally   Counseling given: Not Answered Tobacco comments:  during smoking years only smoked ocassionally   Outpatient Medications Prior to Visit  Medication Sig Dispense Refill   acetaminophen  (TYLENOL ) 500 MG tablet Take 1,000 mg by mouth every 6 (six) hours as needed for mild pain.     ADVAIR  HFA 115-21 MCG/ACT inhaler INHALE 2 PUFFS INTO THE LUNGS TWICE A DAY 36 each 3   albuterol  (VENTOLIN  HFA) 108 (90 Base) MCG/ACT inhaler Inhale 1-2 puffs into the lungs every 6 (six) hours as needed. 8 g 2   atorvastatin  (LIPITOR) 20 MG tablet Take 20 mg by mouth daily.     calcium  carbonate (OS-CAL - DOSED IN MG OF ELEMENTAL CALCIUM ) 1250 (500 Ca) MG tablet Take 1 tablet by mouth daily with breakfast.     Cholecalciferol  (VITAMIN D3) 50 MCG (2000 UT) TABS Take 2,000 Units by mouth daily.     clobetasol ointment (TEMOVATE) 0.05 % Apply 1 application topically daily. Apply daily to skin to affected area every day for 30 days     fluticasone  (FLONASE ) 50 MCG/ACT nasal spray Place 2 sprays into both nostrils daily. 18.2 mL 2   levocetirizine (XYZAL ) 5 MG tablet Take 1 tablet (5 mg total) by mouth every evening. 30 tablet 5   lisinopril -hydrochlorothiazide  (ZESTORETIC ) 20-25 MG tablet Take 1 tablet by mouth daily.     Melatonin 10 MG CAPS Take by mouth.     Multiple Vitamins-Minerals (PRESERVISION AREDS 2+MULTI VIT) CAPS      benzonatate  (TESSALON ) 200 MG capsule Take 1 capsule (200 mg total) by mouth 3 (three) times daily as needed for cough. 30 capsule 3   promethazine -dextromethorphan (PROMETHAZINE -DM) 6.25-15 MG/5ML syrup Take 5 mLs by mouth 4 (four) times daily as needed for cough. 180 mL 0   No facility-administered medications prior to visit.     Review of Systems:   Constitutional:   No  weight loss, night sweats,  Fevers, chills, fatigue, or  lassitude.  HEENT:   No headaches,  Difficulty swallowing,  Tooth/dental problems, or  Sore throat,                No sneezing, itching, ear ache, nasal congestion, post nasal drip,   CV:  No chest pain,   Orthopnea, PND, swelling in lower extremities, anasarca, dizziness, palpitations, syncope.   GI  No heartburn, indigestion, abdominal pain, nausea, vomiting, diarrhea, change in bowel habits, loss of appetite, bloody stools.   Resp: .  No wheezing.  No chest wall deformity  Skin: no rash or lesions.  GU: no dysuria, change in color of urine, no urgency or frequency.  No flank pain, no hematuria   MS:  No joint pain or swelling.  No decreased range of motion.  No back pain.    Physical Exam  BP 120/69   Pulse 86   Temp 97.6 F (36.4 C) (Oral)   Ht 5' 4 (1.626 m)   Wt 150 lb (68 kg)   SpO2 96%   BMI 25.75 kg/m   GEN: A/Ox3; pleasant , NAD, well nourished    HEENT:  [place]/AT,   NOSE-clear, THROAT-clear, no lesions, no postnasal drip or exudate noted.   NECK:  Supple w/ fair ROM; no JVD; normal carotid impulses w/o bruits; no thyromegaly or nodules palpated; no lymphadenopathy.    RESP  Clear  P & A; w/o, wheezes/ rales/ or rhonchi. no accessory muscle use,  no dullness to percussion  CARD:  RRR, no m/r/g, no peripheral edema, pulses intact, no cyanosis or clubbing.  GI:   Soft & nt; nml bowel sounds; no organomegaly or masses detected.   Musco: Warm bil, no deformities or joint swelling noted.   Neuro: alert, no focal deficits noted.    Skin: Warm, no lesions or rashes    Lab Results:  CBC    Component Value Date/Time   WBC 8.7 06/23/2022 1054   RBC 4.38 06/23/2022 1054   HGB 13.7 06/23/2022 1054   HCT 39.9 06/23/2022 1054   PLT 255 06/23/2022 1054   MCV 91.1 06/23/2022 1054   MCH 31.3 06/23/2022 1054   MCHC 34.3 06/23/2022 1054   RDW 13.1 06/23/2022 1054   LYMPHSABS 1.5 06/23/2022 1054   MONOABS 0.6 06/23/2022 1054   EOSABS 0.5 06/23/2022 1054   BASOSABS 0.1 06/23/2022 1054    BMET    Component Value Date/Time   NA 137 06/23/2022 1054   K 3.7 06/23/2022 1054   CL 100 06/23/2022 1054   CO2 25 06/23/2022 1054   GLUCOSE 110 (H) 06/23/2022 1054   BUN  14 06/23/2022 1054   CREATININE 0.67 06/23/2022 1054   CALCIUM  9.3 06/23/2022 1054   GFRNONAA >60 06/23/2022 1054   GFRAA >60 03/26/2019 0423    BNP No results found for: BNP  ProBNP No results found for: PROBNP  Imaging: No results found.  Administration History     None          Latest Ref Rng & Units 04/17/2024    9:40 AM 02/12/2023    8:37 AM 02/13/2022    9:32 AM 09/06/2020    9:39 AM 09/22/2019    2:54 PM  PFT Results  FVC-Pre L 2.21  P 2.37  2.43  2.40  2.63   FVC-Predicted Pre % 82  P 85  86  84  91   FVC-Post L 2.18  P 2.22  2.22  2.32  2.46   FVC-Predicted Post % 81  P 80  79  81  85   Pre FEV1/FVC % % 84  P 82  82  77  76   Post FEV1/FCV % % 83  P 85  84  81  79   FEV1-Pre L 1.85  P 1.94  1.98  1.84  2.00   FEV1-Predicted Pre % 92  P 93  94  86  92   FEV1-Post L 1.80  P 1.89  1.86  1.88  1.95   DLCO uncorrected ml/min/mmHg 11.56  P 11.91  11.71  13.63  14.39   DLCO UNC% % 60  P 62  60  70  74   DLCO corrected ml/min/mmHg  11.91  11.71  13.63    DLCO COR %Predicted %  62  60  70    DLVA Predicted % 114  P 84  76  88  88   TLC L 3.16  P 3.74  4.78  5.03  3.89   TLC % Predicted % 61  P 73  93  98  76   RV % Predicted % 46  P 62  102  48  60     P Preliminary result    Lab Results  Component Value Date   NITRICOXIDE 8 12/19/2023        Assessment & Plan:   No problem-specific Assessment & Plan notes found for this encounter.  Assessment and Plan Assessment & Plan Interstitial  lung disease- progressive changes on HRCT chest-probable UIP. PFT remain stable. No exertional desats. On nocturnal oxygen .  she reports no significant increase in dyspnea. Potential irritants, such as down comforters, should be avoided. Autoimmune work up previously negative. Antifibrotic medications Ofev and Esbriet were discussed -patient education -slow disease progression, with side effects including gastrointestinal upset, nausea, diarrhea, decreased appetite, and  weight loss. Insurance coverage and cost assistance options were reviewed. Lifelong treatment is required. She would like to think it over. Order an echocardiogram to assess for pulmonary hypertension.  Schedule a follow-up in two months to discuss echocardiogram results and antifibrotic treatment options.  Chronic hoarseness   Chronic hoarseness persists despite stopping lisinopril  and improvement in cough. It may be related to Advair  inhaler use, which can cause hoarseness. ENT evaluation is necessary to rule out other causes such as vocal cord issues . Refer to ENT for evaluation, including direct laryngoscopy if indicated. Advise rinsing mouth after using Advair  inhaler to reduce hoarseness.  Nocturnal hypoxemia-continue on O2 At bedtime .   Chronic bronchitis -recent flare now resolved. Continue on Advair  Twice daily  Albuterol  inhaler As needed    Allergic rhinitis -continue on Xyzal  daily   Splenic artery aneurysm (13mm ) noted on recent CT chest. Asymptomatic. Follow up with PCP for ongoing surveillance. Patient aware .   Plan  Patient Instructions  Set up for 2 D echo .  Continue on Advair  Twice daily  -rinse after use.  Albuterol  inhaler As needed   Continue on Xyzal  At bedtime   Refer to ENT.  Can discuss in more detail Anti-Fibrotic medications OFEV and Esbriet on return visit.  Follow up with Dr. Kassie in 2 months and As needed   Please contact office for sooner follow up if symptoms do not improve or worsen or seek emergency care        I spent    42 minutes dedicated to the care of this patient on the date of this encounter to include pre-visit review of records, face-to-face time with the patient discussing conditions above, post visit ordering of testing, clinical documentation with the electronic health record, making appropriate referrals as documented, and communicating necessary findings to members of the patients care team.    Madelin Stank, NP 04/21/2024

## 2024-04-21 NOTE — Patient Instructions (Addendum)
 Set up for 2 D echo .  Continue on Advair  Twice daily  -rinse after use.  Albuterol  inhaler As needed   Continue on Xyzal  At bedtime   Refer to ENT.  Can discuss in more detail Anti-Fibrotic medications OFEV and Esbriet on return visit.  Follow up with Dr. Kassie in 2 months and As needed   Please contact office for sooner follow up if symptoms do not improve or worsen or seek emergency care

## 2024-04-25 ENCOUNTER — Encounter (INDEPENDENT_AMBULATORY_CARE_PROVIDER_SITE_OTHER): Payer: Self-pay

## 2024-04-28 DIAGNOSIS — Z23 Encounter for immunization: Secondary | ICD-10-CM | POA: Diagnosis not present

## 2024-05-01 ENCOUNTER — Ambulatory Visit: Payer: Self-pay | Admitting: Pulmonary Disease

## 2024-05-04 DIAGNOSIS — E7800 Pure hypercholesterolemia, unspecified: Secondary | ICD-10-CM | POA: Diagnosis not present

## 2024-05-04 DIAGNOSIS — I700 Atherosclerosis of aorta: Secondary | ICD-10-CM | POA: Diagnosis not present

## 2024-05-04 DIAGNOSIS — I10 Essential (primary) hypertension: Secondary | ICD-10-CM | POA: Diagnosis not present

## 2024-05-15 DIAGNOSIS — I700 Atherosclerosis of aorta: Secondary | ICD-10-CM | POA: Diagnosis not present

## 2024-05-15 DIAGNOSIS — I10 Essential (primary) hypertension: Secondary | ICD-10-CM | POA: Diagnosis not present

## 2024-06-03 DIAGNOSIS — E7800 Pure hypercholesterolemia, unspecified: Secondary | ICD-10-CM | POA: Diagnosis not present

## 2024-06-03 DIAGNOSIS — I10 Essential (primary) hypertension: Secondary | ICD-10-CM | POA: Diagnosis not present

## 2024-06-03 DIAGNOSIS — I700 Atherosclerosis of aorta: Secondary | ICD-10-CM | POA: Diagnosis not present

## 2024-06-13 ENCOUNTER — Other Ambulatory Visit: Payer: Self-pay | Admitting: Nurse Practitioner

## 2024-06-13 DIAGNOSIS — R058 Other specified cough: Secondary | ICD-10-CM

## 2024-06-13 DIAGNOSIS — J309 Allergic rhinitis, unspecified: Secondary | ICD-10-CM

## 2024-06-14 DIAGNOSIS — I10 Essential (primary) hypertension: Secondary | ICD-10-CM | POA: Diagnosis not present

## 2024-06-14 DIAGNOSIS — I700 Atherosclerosis of aorta: Secondary | ICD-10-CM | POA: Diagnosis not present

## 2024-06-18 ENCOUNTER — Other Ambulatory Visit (INDEPENDENT_AMBULATORY_CARE_PROVIDER_SITE_OTHER)

## 2024-06-18 DIAGNOSIS — G4734 Idiopathic sleep related nonobstructive alveolar hypoventilation: Secondary | ICD-10-CM

## 2024-06-18 DIAGNOSIS — R0609 Other forms of dyspnea: Secondary | ICD-10-CM | POA: Diagnosis not present

## 2024-06-18 DIAGNOSIS — J849 Interstitial pulmonary disease, unspecified: Secondary | ICD-10-CM

## 2024-06-18 LAB — ECHOCARDIOGRAM COMPLETE
Area-P 1/2: 3.53 cm2
S' Lateral: 1.59 cm

## 2024-06-23 ENCOUNTER — Ambulatory Visit: Payer: Self-pay | Admitting: Adult Health

## 2024-07-04 DIAGNOSIS — I10 Essential (primary) hypertension: Secondary | ICD-10-CM | POA: Diagnosis not present

## 2024-07-04 DIAGNOSIS — I700 Atherosclerosis of aorta: Secondary | ICD-10-CM | POA: Diagnosis not present

## 2024-07-04 DIAGNOSIS — E7800 Pure hypercholesterolemia, unspecified: Secondary | ICD-10-CM | POA: Diagnosis not present

## 2024-07-14 DIAGNOSIS — I10 Essential (primary) hypertension: Secondary | ICD-10-CM | POA: Diagnosis not present

## 2024-07-14 DIAGNOSIS — I700 Atherosclerosis of aorta: Secondary | ICD-10-CM | POA: Diagnosis not present

## 2024-07-15 DIAGNOSIS — N952 Postmenopausal atrophic vaginitis: Secondary | ICD-10-CM | POA: Diagnosis not present

## 2024-07-15 DIAGNOSIS — E7800 Pure hypercholesterolemia, unspecified: Secondary | ICD-10-CM | POA: Diagnosis not present

## 2024-07-15 DIAGNOSIS — I10 Essential (primary) hypertension: Secondary | ICD-10-CM | POA: Diagnosis not present

## 2024-07-15 DIAGNOSIS — R7303 Prediabetes: Secondary | ICD-10-CM | POA: Diagnosis not present

## 2024-07-15 DIAGNOSIS — M8580 Other specified disorders of bone density and structure, unspecified site: Secondary | ICD-10-CM | POA: Diagnosis not present

## 2024-07-15 DIAGNOSIS — J849 Interstitial pulmonary disease, unspecified: Secondary | ICD-10-CM | POA: Diagnosis not present

## 2024-07-15 DIAGNOSIS — I700 Atherosclerosis of aorta: Secondary | ICD-10-CM | POA: Diagnosis not present

## 2024-07-22 ENCOUNTER — Ambulatory Visit (HOSPITAL_BASED_OUTPATIENT_CLINIC_OR_DEPARTMENT_OTHER): Admitting: Pulmonary Disease

## 2024-07-28 ENCOUNTER — Ambulatory Visit (INDEPENDENT_AMBULATORY_CARE_PROVIDER_SITE_OTHER): Admitting: Pulmonary Disease

## 2024-07-28 ENCOUNTER — Encounter (HOSPITAL_BASED_OUTPATIENT_CLINIC_OR_DEPARTMENT_OTHER): Payer: Self-pay | Admitting: Pulmonary Disease

## 2024-07-28 VITALS — BP 131/63 | HR 86 | Temp 97.9°F | Ht 64.0 in | Wt 149.6 lb

## 2024-07-28 DIAGNOSIS — R490 Dysphonia: Secondary | ICD-10-CM | POA: Diagnosis not present

## 2024-07-28 DIAGNOSIS — R0902 Hypoxemia: Secondary | ICD-10-CM | POA: Diagnosis not present

## 2024-07-28 DIAGNOSIS — J849 Interstitial pulmonary disease, unspecified: Secondary | ICD-10-CM | POA: Diagnosis not present

## 2024-07-28 DIAGNOSIS — Z87891 Personal history of nicotine dependence: Secondary | ICD-10-CM

## 2024-07-28 DIAGNOSIS — J42 Unspecified chronic bronchitis: Secondary | ICD-10-CM | POA: Diagnosis not present

## 2024-07-28 DIAGNOSIS — G4734 Idiopathic sleep related nonobstructive alveolar hypoventilation: Secondary | ICD-10-CM

## 2024-07-28 NOTE — Assessment & Plan Note (Signed)
--  CONTINUE Advair  115-21 mg TWO puffs TWICE a day --CONTINUE Albuterol  TWO puffs every 4-6 hours as needed  When you are sick Increase to Advair  2 puffs three times a day for worsening shortness of breath, wheezing and cough. If you symptoms do not improve in 24-48 hours, please our office for evaluation and/or prednisone  taper.

## 2024-07-28 NOTE — Patient Instructions (Signed)
 ILD (interstitial lung disease) (HCC) Mild progressive changes on CT but unchanged DLCO since 2023-2025.  Echo 06/18/24 with no evidence of PH --Will enroll in Ofev. Patient does not wish to pursue if cost is too high --UTD influenza  Chronic bronchitis, unspecified chronic bronchitis type (HCC) --CONTINUE Advair  115-21 mg TWO puffs TWICE a day --CONTINUE Albuterol  TWO puffs every 4-6 hours as needed  When you are sick Increase to Advair  2 puffs three times a day for worsening shortness of breath, wheezing and cough. If you symptoms do not improve in 24-48 hours, please our office for evaluation and/or prednisone  taper.  Hoarseness Improved. --ENT referral placed on last visit. OK to cancel  Nocturnal hypoxemia --Last ONO 02/27/23. ORDER renewal today --Continue to wear 2L nightly

## 2024-07-28 NOTE — Assessment & Plan Note (Addendum)
 Improved. --ENT referral placed on last visit. OK to cancel

## 2024-07-28 NOTE — Progress Notes (Signed)
 Subjective:   PATIENT ID: Ann [name] GENDER: female DOB: 03-Jun-1946, MRN: [id number]   HPI  Chief Complaint  Patient presents with   Interstitial Lung Disease    Reason for Visit: Follow-up  [name] is a 78 year old female former smoker with ILD who presents for follow-up.  Synopsis: Initially referred for chronic cough and diagnosed with mild ILD. Autoimmune work-up revealed +ANA 1:160 with myositis panel, sjogren's antibodies and scleroderma screen returning negative. Rheumatology with no further work-up indicated.  03/21/21 Since our last visit six months ago, she reports her respiratory symptoms are overall stable on Advair . She was previously on Dulera  but switched to available inhaler without any worsening in symptoms. Over a week ago she did develop a recent cold (COVID-19 neg) that has improved. No shortness of breath or wheezing but has a residual cough with greenish sputum. Otherwise no complaints. Denies exacerbations or ED visits requiring steroids or antibiotics for respiratory infections.  09/21/21 Since our last visit she reports viral illness for ~10 days. She had nonproductive cough, no wheezing. She had recent sick contact but did not take COVID-19. Symptoms have resolved. She had a hospitalization in end Sept for craniotomy for incidental L MCA aneurysm. Has been compliant with Advair . Never uses albuterol . No other respiratory illnesses aside from this viral illness  02/13/22 She has been compliant with reduced strength Advair  and reports well-controlled symptoms. Denies coughing, wheezing or shortness of breath. Has not needed rescue inhaler at all. She walks every other day. No limitations in activity  02/12/23 Since our last visit overall doing well in the last year except for last month in May 2024 requiring bronchitis requiring prednisone . No further issues since then. Denies shortness of breath, cough or wheezing. She remains compliant with  her Advair  twice a day. No limitations in activity. Walking 2-3 times a week for one hour.   09/14/23 Since our last visit she was started on nocturnal oxygen . On Advair  twice a day. Feels both oxygen  and inhalers help with her breathing. No rescue inhalers. Denies shortness of breath, wheezing or coughing. Before the holidays she was walking daily.   07/28/24 Since our last visit she was seen by NP Ann [name] in May and August for persistent cough with slow to resolve bronchitis. Had discontinued lisinopril  and cough was reported nearly gone. Remains on nightly oxygen . Offered antifibrotic however declined on 04/2024 visit. She was referred to ENT for hoarseness however has not made an appointment since her symptoms improved.  Social History: Former smoker. Quit 1976.  Past Medical History:  Diagnosis Date   Bronchitis, chronic (HCC)    Hyperlipidemia    Hypertension    Interstitial lung disease (HCC) 2020   pt sees a pulmonologist   Pneumonia    Pulmonary nodule    Sepsis (HCC)      Outpatient Medications Prior to Visit  Medication Sig Dispense Refill   acetaminophen  (TYLENOL ) 500 MG tablet Take 1,000 mg by mouth every 6 (six) hours as needed for mild pain.     ADVAIR  HFA 115-21 MCG/ACT inhaler INHALE 2 PUFFS INTO THE LUNGS TWICE A DAY 36 each 3   albuterol  (VENTOLIN  HFA) 108 (90 Base) MCG/ACT inhaler Inhale 1-2 puffs into the lungs every 6 (six) hours as needed. 8 g 2   atorvastatin  (LIPITOR) 20 MG tablet Take 20 mg by mouth daily.     calcium  carbonate (OS-CAL - DOSED IN MG OF ELEMENTAL CALCIUM ) 1250 (500 Ca) MG tablet Take  1 tablet by mouth daily with breakfast.     Cholecalciferol  (VITAMIN D3) 50 MCG (2000 UT) TABS Take 2,000 Units by mouth daily.     clobetasol ointment (TEMOVATE) 0.05 % Apply 1 application topically daily. Apply daily to skin to affected area every day for 30 days     levocetirizine (XYZAL ) 5 MG tablet TAKE 1 TABLET BY MOUTH EVERY DAY IN THE EVENING 30 tablet 5    Multiple Vitamins-Minerals (PRESERVISION AREDS 2+MULTI VIT) CAPS      Melatonin 10 MG CAPS Take by mouth. (Patient not taking: Reported on 07/28/2024)     benzonatate  (TESSALON ) 200 MG capsule Take 1 capsule (200 mg total) by mouth 3 (three) times daily as needed for cough. 30 capsule 3   fluticasone  (FLONASE ) 50 MCG/ACT nasal spray Place 2 sprays into both nostrils daily. 18.2 mL 2   promethazine -dextromethorphan (PROMETHAZINE -DM) 6.25-15 MG/5ML syrup Take 5 mLs by mouth 4 (four) times daily as needed for cough. 180 mL 0   No facility-administered medications prior to visit.    Review of Systems  Constitutional:  Negative for chills, diaphoresis, fever, malaise/fatigue and weight loss.  HENT:  Negative for congestion.   Respiratory:  Negative for cough, hemoptysis, sputum production, shortness of breath and wheezing.   Cardiovascular:  Negative for chest pain, palpitations and leg swelling.    Objective:   Vitals:   07/28/24 0823  BP: 131/63  Pulse: 86  Temp: 97.9 F (36.6 C)  SpO2: 95%  Weight: 149 lb 9.6 oz (67.9 kg)  Height: 5' 4 (1.626 m)    SpO2: 95 %  Physical Exam: General: Well-appearing, no acute distress HENT: [place], AT Eyes: EOMI, no scleral icterus Respiratory: Bibasilar crackles Cardiovascular: RRR, -M/R/G, no JVD Extremities:-Edema,-tenderness Neuro: AAO x4, CNII-XII grossly intact Psych: Normal mood, normal affect  Data Reviewed:  Imaging: CTA 03/26/19 - No PE, borderline mediastinal (9mm) and right hilar (11mm) lymph nodes, stable compared to 02/16/19 CT Chest 09/01/19 - Minimal bibasilar interstitial thickening/scarring at the bases with minimal bronchiectasis R>L.  Mild bilateral GGO with improvement in LUL nodule/GGO HRCT Chest 02/13/20 - Mild subpleural GGO with mild traction bronchiectasis, basilar predominance. No honeycombing. Stable lung nodules CTA 04/02/22 - Dependent atelectasis along the lung bases and posterior regions CT Chest 02/21/24 -  Compared to 04/02/22, parenchyma with findings more consistent with UIP with subpleural reticulations, GGO and traction bronchiectasis. No honeycombing  PFT: 09/22/19 FVC 2.46 (85%) FEV1 1.95 (89%) Ratio 76  TLC 76% DLCO 74% Interpretation: Mild restrictive defect with mildly reduced uncorrected DLCO  09/06/20 FVC 2.32 (81%) FEV1 1.88 (88%) Ratio 77  TLC 98% DLCO 70% Interpretation: Mild restrictive defect with mildly reduced DLCO (corrected)  02/13/22 FVC 2.22 (86%) FEV1 1.98 (94%) Ratio 82  TLC 93% DLCO 60% Interpretation: Mild reduced DLCO  02/12/23 FVC 2.22 (80%) FEV1 1.89 (91%) Ratio 82  TLC 79% DLCO 62% Interpretation: Mild restrictive defect with mildly reduced DLCO. No significant change in DLCO  07/28/24 FVC 2.18 (81%) FEV1 1.80 (90%) Ratio 83  TLC 61% DLCO 60% Interpretation: Mild restrictive defect with unchanged mildly reduced gas exchange   Labs: 03/26/19 Abs eos 1500 04/26/21 Abs eso 400   Assessment & Plan:  Discussion: 78 year old female with mild ILD of unknown etiology (ANA+, no further work-up per Rheum. 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 Discussed clinical course and management of ILD including bronchodilator regimen, oxygen , preventive care including vaccinations and action plan for exacerbation.  Assessment &  Plan ILD (interstitial lung disease) (HCC) Mild progressive changes on CT but unchanged DLCO since 2023-2025.  Echo 06/18/24 with no evidence of PH --Will enroll in Ofev. Patient does not wish to pursue if cost is too high --UTD influenza Chronic bronchitis, unspecified chronic bronchitis type (HCC) --CONTINUE Advair  115-21 mg TWO puffs TWICE a day --CONTINUE Albuterol  TWO puffs every 4-6 hours as needed  When you are sick Increase to Advair  2 puffs three times a day for worsening shortness of breath, wheezing and cough. If you symptoms do not improve in 24-48 hours, please our office for evaluation and/or prednisone   taper. Hoarseness Improved. --ENT referral placed on last visit. OK to cancel Nocturnal hypoxemia --Last ONO 02/27/23. ORDER renewal today --Continue to wear 2L nightly  Health Maintenance Immunization History  Administered Date(s) Administered   Fluzone Influenza virus vaccine,trivalent (IIV3), split virus 05/28/2019, 05/27/2020, 05/11/2021   INFLUENZA, HIGH DOSE SEASONAL PF 06/18/2018, 05/28/2019   Influenza-Unspecified 09/05/2015, 07/05/2017   PFIZER(Purple Top)SARS-COV-2 Vaccination 10/11/2019, 11/01/2019, 06/19/2020, 04/13/2021   Pneumococcal Conjugate-13 07/04/2016, 07/17/2016   Pneumococcal Polysaccharide-23 12/09/2019   Tdap 02/11/2020   Zoster, Live 02/11/2020, 05/27/2020   Orders Placed This Encounter  Procedures   Ambulatory referral to Pharmacotherapy Clinic    Referral Priority:   Routine    Referral Type:   Consultation    Referral Reason:   Specialty Services Required    Number of Visits Requested:   1   Pulse oximetry, overnight    Standing Status:   Future    Expiration Date:   07/28/2025    Scheduling Instructions:     On room air   No orders of the defined types were placed in this encounter.  Return in about 6 months (around 01/25/2025).  I have spent a total time of 30-minutes on the day of the appointment including chart review, data review, collecting history, coordinating care and discussing medical diagnosis and plan with the patient/family. Past medical history, allergies, medications were reviewed. Pertinent imaging, labs and tests included in this note have been reviewed and interpreted independently by me.  Ido Wollman Slater Staff, MD [facility] 07/28/2024 8:38 AM

## 2024-07-28 NOTE — Assessment & Plan Note (Addendum)
 Mild progressive changes on CT but unchanged DLCO since 2023-2025.  Echo 06/18/24 with no evidence of PH --Will enroll in Ofev. Patient does not wish to pursue if cost is too high --UTD influenza

## 2024-08-03 DIAGNOSIS — I700 Atherosclerosis of aorta: Secondary | ICD-10-CM | POA: Diagnosis not present

## 2024-08-03 DIAGNOSIS — E7800 Pure hypercholesterolemia, unspecified: Secondary | ICD-10-CM | POA: Diagnosis not present

## 2024-08-03 DIAGNOSIS — I10 Essential (primary) hypertension: Secondary | ICD-10-CM | POA: Diagnosis not present

## 2024-08-12 ENCOUNTER — Telehealth (HOSPITAL_BASED_OUTPATIENT_CLINIC_OR_DEPARTMENT_OTHER): Payer: Self-pay

## 2024-08-12 NOTE — Telephone Encounter (Unsigned)
 Copied from CRM [id number]. Topic: Clinical - Request for Lab/Test Order >> Aug 12, 2024  2:07 PM Devaughn RAMAN wrote: Reason for CRM: Pt stated she was advised per Dr.Ellison at her last appt to have her oxygen  levels retested by [employer] and that someone would f/u with her and to advise her if they had not contacted her as if they were not done in a certain amount of time she may have to come back into the office and be seen or retested, pt would like a f/u call back regarding this.

## 2024-08-13 NOTE — Telephone Encounter (Signed)
 ONO order was sent to [employer] on 12/9, and confirmation was received from [employer] on 12/10. Spoke with Mrs. Arrellano regarding the update on ONO, she stated that [employer] has not reached out to her yet. Provided patient with [employer] contact number. She will reach out to [facility] if anything further is needed.

## 2024-08-18 DIAGNOSIS — H18523 Epithelial (juvenile) corneal dystrophy, bilateral: Secondary | ICD-10-CM | POA: Diagnosis not present

## 2024-08-18 DIAGNOSIS — H353131 Nonexudative age-related macular degeneration, bilateral, early dry stage: Secondary | ICD-10-CM | POA: Diagnosis not present

## 2024-08-20 ENCOUNTER — Telehealth: Payer: Self-pay

## 2024-08-20 NOTE — Telephone Encounter (Signed)
 Received new start paperwork for Ofev - opening benefits investigation in this thread.

## 2024-08-25 ENCOUNTER — Other Ambulatory Visit (HOSPITAL_COMMUNITY): Payer: Self-pay

## 2024-08-25 NOTE — Telephone Encounter (Signed)
 Submitted a Prior Authorization request to [employer] for OFEV via CoverMyMeds. Authorization has been APPROVED from 07/26/24 to 08/25/25. Have not yet received approval letter.  Test Claim revealed that a 30 day supply has a copay of $46. Patient can fill through [facility]: [phone]   Authorization#: [id number]    Pt enrolled in Pulmonary Fibrosis grant through PAF:  Amount: $5000 Award Period: 02/27/24 - 08/25/25 BIN: [id number] PCN: [id number] Group: [id number] ID: [id number]  For pharmacy inquiries, contact PDMI at [phone]. For patient inquiries, contact PAF at [phone].

## 2024-09-03 ENCOUNTER — Other Ambulatory Visit: Payer: Self-pay

## 2024-09-03 ENCOUNTER — Other Ambulatory Visit (HOSPITAL_COMMUNITY): Payer: Self-pay

## 2024-09-03 ENCOUNTER — Ambulatory Visit: Attending: Pulmonary Disease

## 2024-09-03 DIAGNOSIS — Z5181 Encounter for therapeutic drug level monitoring: Secondary | ICD-10-CM

## 2024-09-03 DIAGNOSIS — J849 Interstitial pulmonary disease, unspecified: Secondary | ICD-10-CM

## 2024-09-03 MED ORDER — OFEV 150 MG PO CAPS
150.0000 mg | ORAL_CAPSULE | Freq: Two times a day (BID) | ORAL | 3 refills | Status: AC
  Filled 2024-09-03: qty 60, 30d supply, fill #0
  Filled 2024-10-02: qty 60, 30d supply, fill #1

## 2024-09-03 NOTE — Progress Notes (Signed)
 Specialty Pharmacy Initial Fill Coordination Note  Ann [name] is a 78 y.o. female contacted today regarding initial fill of specialty medication(s) Nintedanib Esylate (Ofev)   Patient requested Delivery   Delivery date: 09/08/24   Verified address: [address] [place], [place] [address]   Medication will be filled on: 09/05/24   Patient is aware of $0 copayment.

## 2024-09-03 NOTE — Progress Notes (Signed)
 [facility]  Referring Provider: Dr. Kassie  Virtual Visit via Telephone Note  I connected with Ann [name] on 09/03/2024 at 11:00 AM EST by telephone and verified that I am speaking with the correct person using two identifiers.  Location: Patient: home Provider: office   I discussed the limitations, risks, security and privacy concerns of performing an evaluation and management service by telephone and the availability of in person appointments. I also discussed with the patient that there may be a patient responsible charge related to this service. The patient expressed understanding and agreed to proceed.  Subjective:  Patient called today by [facility] pharmacy team for Ofev  new start counseling.   Patient was last seen by Dr. Kassie on 07/28/24. Pertinent past medical history includes ILD, unknown etiology. Per documentation on 07/28/24, mild progressive changes on CT but unchanged DLCO since 2023-2025. Referred to pharmacy team to start Ofev .  Objective: Allergies[1]  Outpatient Encounter Medications as of 09/03/2024  Medication Sig   acetaminophen  (TYLENOL ) 500 MG tablet Take 1,000 mg by mouth every 6 (six) hours as needed for mild pain.   ADVAIR  HFA 115-21 MCG/ACT inhaler INHALE 2 PUFFS INTO THE LUNGS TWICE A DAY   albuterol  (VENTOLIN  HFA) 108 (90 Base) MCG/ACT inhaler Inhale 1-2 puffs into the lungs every 6 (six) hours as needed.   atorvastatin  (LIPITOR) 20 MG tablet Take 20 mg by mouth daily.   calcium  carbonate (OS-CAL - DOSED IN MG OF ELEMENTAL CALCIUM ) 1250 (500 Ca) MG tablet Take 1 tablet by mouth daily with breakfast.   Cholecalciferol  (VITAMIN D3) 50 MCG (2000 UT) TABS Take 2,000 Units by mouth daily.   clobetasol ointment (TEMOVATE) 0.05 % Apply 1 application topically daily. Apply daily to skin to affected area every day for 30 days   levocetirizine (XYZAL ) 5 MG tablet TAKE 1 TABLET BY MOUTH EVERY DAY IN THE EVENING   Melatonin 10  MG CAPS Take by mouth. (Patient not taking: Reported on 07/28/2024)   Multiple Vitamins-Minerals (PRESERVISION AREDS 2+MULTI VIT) CAPS    No facility-administered encounter medications on file as of 09/03/2024.     Immunization History  Administered Date(s) Administered   Fluzone Influenza virus vaccine,trivalent (IIV3), split virus 05/28/2019, 05/27/2020, 05/11/2021   INFLUENZA, HIGH DOSE SEASONAL PF 06/18/2018, 05/28/2019   Influenza-Unspecified 09/05/2015, 07/05/2017   PFIZER(Purple Top)SARS-COV-2 Vaccination 10/11/2019, 11/01/2019, 06/19/2020, 04/13/2021   Pneumococcal Conjugate-13 07/04/2016, 07/17/2016   Pneumococcal Polysaccharide-23 12/09/2019   Tdap 02/11/2020   Zoster, Live 02/11/2020, 05/27/2020      PFT's TLC  Date Value Ref Range Status  04/17/2024 3.16 L Final      CMP     Component Value Date/Time   NA 137 06/23/2022 1054   K 3.7 06/23/2022 1054   CL 100 06/23/2022 1054   CO2 25 06/23/2022 1054   GLUCOSE 110 (H) 06/23/2022 1054   BUN 14 06/23/2022 1054   CREATININE 0.67 06/23/2022 1054   CALCIUM  9.3 06/23/2022 1054   PROT 6.9 03/24/2021 1939   ALBUMIN 4.2 03/24/2021 1939   AST 22 03/24/2021 1939   ALT 30 03/24/2021 1939   ALKPHOS 45 03/24/2021 1939   BILITOT 1.0 03/24/2021 1939   GFRNONAA >60 06/23/2022 1054   GFRAA >60 03/26/2019 0423      CBC    Component Value Date/Time   WBC 8.7 06/23/2022 1054   RBC 4.38 06/23/2022 1054   HGB 13.7 06/23/2022 1054   HCT 39.9 06/23/2022 1054   PLT 255 06/23/2022 1054   MCV  91.1 06/23/2022 1054   MCH 31.3 06/23/2022 1054   MCHC 34.3 06/23/2022 1054   RDW 13.1 06/23/2022 1054   LYMPHSABS 1.5 06/23/2022 1054   MONOABS 0.6 06/23/2022 1054   EOSABS 0.5 06/23/2022 1054   BASOSABS 0.1 06/23/2022 1054      LFT's - need updated    Latest Ref Rng & Units 03/24/2021    7:39 PM  Hepatic Function  Total Protein 6.5 - 8.1 g/dL 6.9   Albumin 3.5 - 5.0 g/dL 4.2   AST 15 - 41 U/L 22   ALT 0 - 44 U/L 30   Alk  Phosphatase 38 - 126 U/L 45   Total Bilirubin 0.3 - 1.2 mg/dL 1.0       HRCT (93/80/7974) IMPRESSION: 1. Pulmonary parenchymal pattern of interstitial lung disease appears largely new from 04/02/2022. Findings are categorized as probable UIP per consensus guidelines: Diagnosis of Idiopathic Pulmonary Fibrosis: An Official ATS/ERS/JRS/ALAT Clinical Practice Guideline. Am JINNY Honey Crit Care Med Vol 198, Iss 5, [id number], May 05 2017. 2. Small to borderline enlarged mediastinal lymph nodes are likely reactive in etiology in the setting of interstitial lung disease. 3. Large hiatal hernia. 4. 13 mm splenic artery aneurysm. 5.  Aortic atherosclerosis (ICD10-I70.0). 6. Enlarged pulmonic trunk, indicative of pulmonary arterial hypertension.  Assessment and Plan  Ofev  Medication Management Thoroughly counseled patient on the efficacy, mechanism of action, dosing, administration, adverse effects, and monitoring parameters of Ofev . Patient verbalized understanding.  Goals of Therapy: Will not stop or reverse the progression of ILD. It will slow the progression of ILD.  Inhibits tyrosine kinase inhibitors which slow the fibrosis/progression of ILD -Significant reduction in the rate of disease progression was observed after treatment (61.1% [before] vs 33.3% [after], P?=?0.008) over 42 weeks.  Dosing: 150 mg (one capsule) by mouth twice daily (approx 12 hours apart). Discussed taking with food approximately 12 hours apart. Discussed that capsule should not be crushed or split.  Adverse Effects: Nausea, vomiting, diarrhea (2 in 3 patients) appetite loss, weight loss - management of diarrhea with loperamide discussed including max use of 48 hours and max of 8 capsules per day. Abdominal pain (up to 1 in 5 patients) Nasopharyngitis (13%), UTI (6%) Risk of thrombosis (3%) and acute MI (2%) Hypertension (5%) Dizziness Fatigue (10%)  Monitoring: Monitor for diarrhea, nausea and vomiting, GI  perforation, hepatotoxicity  Monitor LFTs - baseline, monthly for first 6 months, then every 3 months routinely Pregnancy test - baseline prn CBC w differential at baseline and every 3 months routinely  Access: Approval of Ofev  through: insurance + grant Rx sent to: [facility]: [phone]   Medication Reconciliation A drug regimen assessment was performed, including review of allergies, interactions, disease-state management, dosing and immunization history. Medications were reviewed with the patient, including name, instructions, indication, goals of therapy, potential side effects, importance of adherence, and safe use.  PLAN:  - Obtain LFTs on Friday, 09/05/24 before starting Ofev  - START Ofev  150mg  capsule by mouth twice daily with food  - Follow-up with Dr. Kassie as recommended in May 2026  I discussed the assessment and treatment plan with the patient. The patient was provided an opportunity to ask questions and all were answered. The patient agreed with the plan and demonstrated an understanding of the instructions.   The patient was advised to call back or seek an in-person evaluation if the symptoms worsen or if the condition fails to improve as anticipated.  I provided 15 minutes of non-face-to-face  time during this encounter.  Ann Puls, PharmD, BCPS, CPP Clinical Pharmacist  [facility]     [1] No Known Allergies

## 2024-09-03 NOTE — Telephone Encounter (Signed)
 See pharmacotherapy visit note 09/03/24 - Ofev counseling completed.

## 2024-09-04 ENCOUNTER — Other Ambulatory Visit: Payer: Self-pay

## 2024-09-05 ENCOUNTER — Other Ambulatory Visit: Payer: Self-pay

## 2024-09-05 ENCOUNTER — Other Ambulatory Visit (HOSPITAL_BASED_OUTPATIENT_CLINIC_OR_DEPARTMENT_OTHER): Payer: Self-pay

## 2024-09-05 ENCOUNTER — Ambulatory Visit (HOSPITAL_BASED_OUTPATIENT_CLINIC_OR_DEPARTMENT_OTHER): Payer: Self-pay | Admitting: Pulmonary Disease

## 2024-09-05 DIAGNOSIS — G4734 Idiopathic sleep related nonobstructive alveolar hypoventilation: Secondary | ICD-10-CM

## 2024-09-05 DIAGNOSIS — Z5181 Encounter for therapeutic drug level monitoring: Secondary | ICD-10-CM

## 2024-09-05 DIAGNOSIS — J849 Interstitial pulmonary disease, unspecified: Secondary | ICD-10-CM

## 2024-09-05 LAB — HEPATIC FUNCTION PANEL
ALT: 30 IU/L (ref 0–32)
AST: 20 IU/L (ref 0–40)
Albumin: 4.3 g/dL (ref 3.8–4.8)
Alkaline Phosphatase: 55 IU/L (ref 49–135)
Bilirubin Total: 0.9 mg/dL (ref 0.0–1.2)
Bilirubin, Direct: 0.29 mg/dL (ref 0.00–0.40)
Total Protein: 6.2 g/dL (ref 6.0–8.5)

## 2024-09-05 NOTE — Progress Notes (Signed)
 Order placed

## 2024-09-06 ENCOUNTER — Ambulatory Visit: Payer: Self-pay | Admitting: Pulmonary Disease

## 2024-09-09 ENCOUNTER — Other Ambulatory Visit: Payer: Self-pay

## 2024-09-11 NOTE — Progress Notes (Signed)
 Please see note dated 09/03/25 - patient was educated in detail prior to first dispense of Ofev .   PLAN:  - Obtain LFTs on Friday, 09/05/24 before starting Ofev  - START Ofev  150mg  capsule by mouth twice daily with food   Aleck Puls, PharmD, BCPS, CPP Clinical Pharmacist  [facility]  [facility]

## 2024-10-02 ENCOUNTER — Other Ambulatory Visit (HOSPITAL_COMMUNITY): Payer: Self-pay

## 2024-10-02 ENCOUNTER — Encounter (INDEPENDENT_AMBULATORY_CARE_PROVIDER_SITE_OTHER): Payer: Self-pay

## 2024-10-03 ENCOUNTER — Other Ambulatory Visit: Payer: Self-pay

## 2024-10-03 NOTE — Progress Notes (Signed)
 Specialty Pharmacy Refill Coordination Note  Cionna Collantes is a 79 y.o. female contacted today regarding refills of specialty medication(s) Nintedanib  Esylate (Ofev )   Patient requested Delivery   Delivery date: 10/10/24   Verified address: [address] [place] [address]   Medication will be filled on: 10/09/24

## 2024-10-06 ENCOUNTER — Telehealth: Payer: Self-pay

## 2024-10-06 ENCOUNTER — Encounter (HOSPITAL_BASED_OUTPATIENT_CLINIC_OR_DEPARTMENT_OTHER): Payer: Self-pay | Admitting: Pulmonary Disease

## 2024-10-06 ENCOUNTER — Other Ambulatory Visit (HOSPITAL_COMMUNITY): Payer: Self-pay

## 2024-10-08 ENCOUNTER — Other Ambulatory Visit: Payer: Self-pay

## 2024-10-09 ENCOUNTER — Telehealth: Payer: Self-pay

## 2024-10-09 ENCOUNTER — Other Ambulatory Visit: Payer: Self-pay

## 2024-10-09 NOTE — Telephone Encounter (Unsigned)
 Copied from CRM [id number]. Topic: Clinical - Prescription Issue >> Oct 09, 2024 11:08 AM Celestine FALCON wrote: Reason for CRM: Pt was recently put on medication called Ofev , and the pt has been on this medication for about 30 days. Pt stated she received a notice in the mail from [employer] with email address listed on the notice [email or web address] asking for Dr. Kassie to complete a physician form documenting the pt's proof of medical diagnosis. She stated the notice further states if this is not received that she is at risk of losing her eligibility for the program for this speciality medication. She stated her patient identification number is [phone]. Pt stated she is coming into the clinic for lab work this coming Monday, and she stated she will have this notice with her.   [employer] Fax Number - [phone] [employer] Phone Number - [phone]  Pt's phone number is [phone] ok to leave a vm.
